# Patient Record
Sex: Female | Born: 1990 | Hispanic: No | Marital: Married | Smoking: Never smoker
Health system: Southern US, Community
[De-identification: ages and names within clinical notes are randomized; demographics above are authoritative.]

## PROBLEM LIST (undated history)

## (undated) ENCOUNTER — Emergency Department (HOSPITAL_BASED_OUTPATIENT_CLINIC_OR_DEPARTMENT_OTHER)

## (undated) DIAGNOSIS — K219 Gastro-esophageal reflux disease without esophagitis: Secondary | ICD-10-CM

## (undated) DIAGNOSIS — O99019 Anemia complicating pregnancy, unspecified trimester: Secondary | ICD-10-CM

## (undated) DIAGNOSIS — O4290 Premature rupture of membranes, unspecified as to length of time between rupture and onset of labor, unspecified weeks of gestation: Secondary | ICD-10-CM

## (undated) DIAGNOSIS — Z3490 Encounter for supervision of normal pregnancy, unspecified, unspecified trimester: Secondary | ICD-10-CM

## (undated) HISTORY — DX: Gastro-esophageal reflux disease without esophagitis: K21.9

---
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
## 2014-08-18 ENCOUNTER — Telehealth: Payer: Self-pay | Admitting: Nurse Practitioner

## 2014-08-26 NOTE — Telephone Encounter (Signed)
Several attempts have been made to contact patient and no answer. This encounter will be closed  

## 2014-11-04 ENCOUNTER — Telehealth: Payer: Self-pay | Admitting: Family Medicine

## 2014-11-04 NOTE — Telephone Encounter (Signed)
Appointment given for tomorrow at 10:25 with Jannifer Rodney, FNP.

## 2014-11-05 ENCOUNTER — Encounter: Payer: Self-pay | Admitting: Physician Assistant

## 2014-11-05 ENCOUNTER — Ambulatory Visit (INDEPENDENT_AMBULATORY_CARE_PROVIDER_SITE_OTHER): Payer: Self-pay | Admitting: Physician Assistant

## 2014-11-05 VITALS — BP 112/70 | HR 84 | Temp 97.0°F | Ht <= 58 in | Wt 111.0 lb

## 2014-11-05 DIAGNOSIS — R1012 Left upper quadrant pain: Secondary | ICD-10-CM

## 2014-11-05 DIAGNOSIS — K5900 Constipation, unspecified: Secondary | ICD-10-CM

## 2014-11-05 DIAGNOSIS — M546 Pain in thoracic spine: Secondary | ICD-10-CM

## 2014-11-05 LAB — POCT CBC
Granulocyte percent: 48.7 %G (ref 37–80)
HCT, POC: 39.8 % (ref 37.7–47.9)
HEMOGLOBIN: 13.1 g/dL (ref 12.2–16.2)
Lymph, poc: 2.3 (ref 0.6–3.4)
MCH: 28.3 pg (ref 27–31.2)
MCHC: 32.9 g/dL (ref 31.8–35.4)
MCV: 85.9 fL (ref 80–97)
MPV: 8.3 fL (ref 0–99.8)
POC Granulocyte: 2.6 (ref 2–6.9)
POC LYMPH PERCENT: 42.1 %L (ref 10–50)
Platelet Count, POC: 354 10*3/uL (ref 142–424)
RBC: 4.63 M/uL (ref 4.04–5.48)
RDW, POC: 12.3 %
WBC: 5.4 10*3/uL (ref 4.6–10.2)

## 2014-11-05 MED ORDER — MELOXICAM 7.5 MG PO TABS: 7.5000 mg | ORAL_TABLET | Freq: Every day | ORAL | Status: DC

## 2014-11-05 MED ORDER — POLYETHYLENE GLYCOL 3350 17 GM/SCOOP PO POWD: 17.0000 g | Freq: Every day | ORAL | Status: DC

## 2014-11-05 NOTE — Progress Notes (Signed)
   Subjective:    Patient ID: Catherine Haney, female    DOB: 11/07/1980, 24 y.o.   MRN: [id number]  HPI 24 y/o female presents with nausea and vomiting, abdominal pain and back pain x 3 days. She presents with her 2 young sons today    Review of Systems  Constitutional: Positive for appetite change (decreased appetite).  HENT: Negative.   Respiratory: Negative.   Cardiovascular: Negative.   Gastrointestinal: Positive for nausea, vomiting, abdominal pain (epigastric) and constipation. Negative for blood in stool.  Genitourinary: Negative for dysuria, frequency and hematuria.  Neurological: Positive for dizziness.       Objective:   Physical Exam  Constitutional: She is oriented to person, place, and time. She appears well-developed and well-nourished. No distress.  petite frame Patient was supposed to have translator. Not available. Difficulty with communication  Cardiovascular: Normal rate, regular rhythm and normal heart sounds.  Exam reveals no gallop and no friction rub.   No murmur heard. Pulmonary/Chest: Effort normal and breath sounds normal. No respiratory distress. She has no wheezes. She has no rales. She exhibits no tenderness.  Musculoskeletal: Normal range of motion. She exhibits tenderness (ttp of bilateral scapluar region and surrounding muscles). She exhibits no edema.  Neurological: She is alert and oriented to person, place, and time.  Skin: She is not diaphoretic.  Psychiatric: She has a normal mood and affect. Her behavior is normal. Judgment and thought content normal.  Nursing note and vitals reviewed.         Assessment & Plan:  1. LUQ abdominal pain - With patients description of symptoms, I will order CBC and Amylase to r/o Pancreatitis or infection.  - POCT CBC - Amylase  2. Bilateral thoracic back pain - Most likely muscle sprain from lifting and picking up children. Several times during visit she picked up the boys which would affect muscles of  the back that she is complaining of pain - POCT CBC - Amylase - meloxicam (MOBIC) 7.5 MG tablet; Take 1 tablet (7.5 mg total) by mouth daily.  Dispense: 30 tablet; Refill: 0  3. Constipation, unspecified constipation type  - polyethylene glycol powder (GLYCOLAX/MIRALAX) powder; Take 17 g by mouth daily.  Dispense: 3350 g; Refill: 1  4. Nausea - most likely due to viral exanthem - Advised patient to follow up if symptoms do not improve      Henrick Mcgue A. Chauncey Reading PA-C

## 2014-11-06 LAB — AMYLASE: AMYLASE: 43 U/L (ref 31–124)

## 2014-11-11 ENCOUNTER — Telehealth: Payer: Self-pay | Admitting: Physician Assistant

## 2014-11-11 NOTE — Telephone Encounter (Signed)
Pt notified lab results were nml Verbalizes understanding appt scheduled

## 2014-12-08 ENCOUNTER — Ambulatory Visit (INDEPENDENT_AMBULATORY_CARE_PROVIDER_SITE_OTHER): Payer: Self-pay | Admitting: Nurse Practitioner

## 2014-12-08 ENCOUNTER — Encounter: Payer: Self-pay | Admitting: Nurse Practitioner

## 2014-12-08 VITALS — BP 121/74 | HR 88 | Temp 97.5°F | Ht <= 58 in | Wt 111.0 lb

## 2014-12-08 DIAGNOSIS — M5431 Sciatica, right side: Secondary | ICD-10-CM

## 2014-12-08 MED ORDER — CYCLOBENZAPRINE HCL 5 MG PO TABS: 5.0000 mg | ORAL_TABLET | Freq: Three times a day (TID) | ORAL | Status: DC | PRN

## 2014-12-08 MED ORDER — KETOROLAC TROMETHAMINE 60 MG/2ML IM SOLN
60.0000 mg | Freq: Once | INTRAMUSCULAR | Status: AC
  Administered 2014-12-08: 60 mg via INTRAMUSCULAR

## 2014-12-08 MED ORDER — MELOXICAM 15 MG PO TABS: 15.0000 mg | ORAL_TABLET | Freq: Every day | ORAL | Status: DC

## 2014-12-08 NOTE — Patient Instructions (Signed)
Sciatica Sciatica is pain, weakness, numbness, or tingling along the path of the sciatic nerve. The nerve starts in the lower back and runs down the back of each leg. The nerve controls the muscles in the lower leg and in the back of the knee, while also providing sensation to the back of the thigh, lower leg, and the sole of your foot. Sciatica is a symptom of another medical condition. For instance, nerve damage or certain conditions, such as a herniated disk or bone spur on the spine, pinch or put pressure on the sciatic nerve. This causes the pain, weakness, or other sensations normally associated with sciatica. Generally, sciatica only affects one side of the body. CAUSES   Herniated or slipped disc.  Degenerative disk disease.  A pain disorder involving the narrow muscle in the buttocks (piriformis syndrome).  Pelvic injury or fracture.  Pregnancy.  Tumor (rare). SYMPTOMS  Symptoms can vary from mild to very severe. The symptoms usually travel from the low back to the buttocks and down the back of the leg. Symptoms can include:  Mild tingling or dull aches in the lower back, leg, or hip.  Numbness in the back of the calf or sole of the foot.  Burning sensations in the lower back, leg, or hip.  Sharp pains in the lower back, leg, or hip.  Leg weakness.  Severe back pain inhibiting movement. These symptoms may get worse with coughing, sneezing, laughing, or prolonged sitting or standing. Also, being overweight may worsen symptoms. DIAGNOSIS  Your caregiver will perform a physical exam to look for common symptoms of sciatica. He or she may ask you to do certain movements or activities that would trigger sciatic nerve pain. Other tests may be performed to find the cause of the sciatica. These may include:  Blood tests.  X-rays.  Imaging tests, such as an MRI or CT scan. TREATMENT  Treatment is directed at the cause of the sciatic pain. Sometimes, treatment is not necessary  and the pain and discomfort goes away on its own. If treatment is needed, your caregiver may suggest:  Over-the-counter medicines to relieve pain.  Prescription medicines, such as anti-inflammatory medicine, muscle relaxants, or narcotics.  Applying heat or ice to the painful area.  Steroid injections to lessen pain, irritation, and inflammation around the nerve.  Reducing activity during periods of pain.  Exercising and stretching to strengthen your abdomen and improve flexibility of your spine. Your caregiver may suggest losing weight if the extra weight makes the back pain worse.  Physical therapy.  Surgery to eliminate what is pressing or pinching the nerve, such as a bone spur or part of a herniated disk. HOME CARE INSTRUCTIONS   Only take over-the-counter or prescription medicines for pain or discomfort as directed by your caregiver.  Apply ice to the affected area for 20 minutes, 3-4 times a day for the first 48-72 hours. Then try heat in the same way.  Exercise, stretch, or perform your usual activities if these do not aggravate your pain.  Attend physical therapy sessions as directed by your caregiver.  Keep all follow-up appointments as directed by your caregiver.  Do not wear high heels or shoes that do not provide proper support.  Check your mattress to see if it is too soft. A firm mattress may lessen your pain and discomfort. SEEK IMMEDIATE MEDICAL CARE IF:   You lose control of your bowel or bladder (incontinence).  You have increasing weakness in the lower back, pelvis, buttocks,   or legs.  You have redness or swelling of your back.  You have a burning sensation when you urinate.  You have pain that gets worse when you lie down or awakens you at night.  Your pain is worse than you have experienced in the past.  Your pain is lasting longer than 4 weeks.  You are suddenly losing weight without reason. MAKE SURE YOU:  Understand these  instructions.  Will watch your condition.  Will get help right away if you are not doing well or get worse. Document Released: 05/03/2001 Document Revised: 11/08/2011 Document Reviewed: 09/18/2011 Exit[employer] Patient Information 2015 [employer]. This information is not intended to replace advice given to you by your health care provider. Make sure you discuss any questions you have with your health care provider.  

## 2014-12-08 NOTE — Progress Notes (Signed)
   Subjective:    Patient ID: Catherine Haney, female    DOB: [birth year], 24 y.o.   MRN: [id number]  HPI Patient in today to discuss back pain- SHe does not speak english so interpreter in to translate- She says that she has a mirena. SHe is having back pain and supra pubic pain and she wonders if the mi[facility] has [facility] month which is unusual for her. Describe pain as low back pain that radiates down right leg. Standing increases pain. Sitting decreases pain. Rates 4/10 currently.    Review of Systems  Constitutional: Negative.   HENT: Negative.   Respiratory: Negative.   Cardiovascular: Negative.   Genitourinary: Negative.   Neurological: Negative.   Psychiatric/Behavioral: Negative.   All other systems reviewed and are negative.      Objective:   Physical Exam  Constitutional: She is oriented to person, place, and time. She appears well-developed and well-nourished.  Genitourinary:  mirena string visible  Musculoskeletal:  FROM of lumbar spine with pain on flexion (+) SLR on right at 90 degrees Motor strength and sensation distally intact  Neurological: She is alert and oriented to person, place, and time.  Skin: Skin is warm and dry.  Psychiatric: She has a normal mood and affect. Her behavior is normal. Judgment and thought content normal.    BP 121/74 mmHg  Pulse 88  Temp(Src) 97.5 F (36.4 C) (Oral)  Ht 4\' 5"  (1.346 m)  Wt 111 lb (50.349 kg)  BMI 27.79 kg/m2       Assessment & Plan:  1. Sciatica, right Moist heat  Rest  RTO prn - ketorolac (TORADOL) injection 60 mg; Inject 2 mLs (60 mg total) into the muscle once. - cyclobenzaprine (FLEXERIL) 5 MG tablet; Take 1 tablet (5 mg total) by mouth 3 (three) times daily as needed for muscle spasms.  Dispense: 30 tablet; Refill: 1 - meloxicam (MOBIC) 15 MG tablet; Take 1 tablet (15 mg total) by mouth daily.  Dispense: 30 tablet; Refill: 3  Mary-Margaret Daphine DeutscherMartin, FNP

## 2015-03-18 ENCOUNTER — Ambulatory Visit (INDEPENDENT_AMBULATORY_CARE_PROVIDER_SITE_OTHER): Payer: Self-pay | Admitting: Family Medicine

## 2015-03-18 ENCOUNTER — Encounter: Payer: Self-pay | Admitting: Family Medicine

## 2015-03-18 VITALS — BP 119/75 | HR 85 | Temp 97.6°F | Ht 60.0 in | Wt 119.6 lb

## 2015-03-18 DIAGNOSIS — J302 Other seasonal allergic rhinitis: Secondary | ICD-10-CM | POA: Insufficient documentation

## 2015-03-18 DIAGNOSIS — G8929 Other chronic pain: Secondary | ICD-10-CM

## 2015-03-18 DIAGNOSIS — M2550 Pain in unspecified joint: Secondary | ICD-10-CM

## 2015-03-18 MED ORDER — FLUTICASONE PROPIONATE 50 MCG/ACT NA SUSP: 2.0000 | Freq: Every day | NASAL | Status: DC

## 2015-03-18 MED ORDER — LORATADINE 10 MG PO TABS: 10.0000 mg | ORAL_TABLET | Freq: Every day | ORAL | Status: DC

## 2015-03-18 MED ORDER — MELOXICAM 15 MG PO TABS: 15.0000 mg | ORAL_TABLET | Freq: Every day | ORAL | Status: DC

## 2015-03-18 MED ORDER — CYCLOBENZAPRINE HCL 5 MG PO TABS: 5.0000 mg | ORAL_TABLET | Freq: Three times a day (TID) | ORAL | Status: DC | PRN

## 2015-03-18 NOTE — Assessment & Plan Note (Signed)
Patient has diffuse joint pain in shoulders and neck and elbows and wrists and knees

## 2015-03-18 NOTE — Assessment & Plan Note (Signed)
Refill mobic and flexeril, want to test for Rheumatoid arthritis but does not have insurance so will test later.

## 2015-03-18 NOTE — Progress Notes (Signed)
BP 119/75 mmHg  Pulse 85  Temp(Src) 97.6 F (36.4 C) (Oral)  Ht 5' (1.524 m)  Wt 119 lb 9.6 oz (54.25 kg)  BMI 23.36 kg/m2   Subjective:    Patient ID: Catherine Haney, female    DOB: 09-09-1990, 24 y.o.   MRN: [id number]  HPI: Catherine Haney is a 24 y.o. female presenting on 03/18/2015 for Joint Pain; Sinusitis; and Cough   HPI Sinus pressure and sore throat Patient presents today with sinus pressure and sore throat. She has been having postnasal drainage. And wakes up in the morning with congestion and sore throat. This time it has been going on for 2 days but she seems to get this every couple weeks and it is especially worse this time of year. She had been diagnosed with allergies prior to coming to [place] and seems to be having this every year for the past 3 years here in [place]. She has not tried any over-the-counter medications for this. She denies any fevers or chills or ear pain or pressure.  Joint pain Patient has been having joint pain in her knees, elbows, wrists and neck for the past couple months. She was given Mobic and Flexeril 2 months ago and they greatly helped her pain medicine as she ran out of them a came back. She cannot recall any rheumatological disorders in her family. The joint pain is dull and achy in nature and is a 3 out of 10. She denies any redness or warmth of the joints.  Relevant past medical, surgical, family and social history reviewed and updated as indicated. Interim medical history since our last visit reviewed. Allergies and medications reviewed and updated.  Review of Systems  Constitutional: Negative for fever and chills.  HENT: Positive for postnasal drip, rhinorrhea, sinus pressure and sore throat. Negative for congestion, ear discharge, ear pain and sneezing.   Eyes: Negative for pain, redness and visual disturbance.  Respiratory: Positive for cough. Negative for chest tightness and shortness of breath.   Cardiovascular:  Negative for chest pain and leg swelling.  Genitourinary: Negative for dysuria and difficulty urinating.  Musculoskeletal: Positive for arthralgias and neck pain. Negative for back pain and gait problem.  Skin: Negative for rash.  Neurological: Negative for light-headedness and headaches.  Psychiatric/Behavioral: Negative for behavioral problems and agitation.  All other systems reviewed and are negative.   Per HPI unless specifically indicated above     Medication List       This list is accurate as of: 03/18/15  2:20 PM.  Always use your most recent med list.               COLLAGEN-VITAMIN C PO  Take by mouth. Take two per day     cyclobenzaprine 5 MG tablet  Commonly known as:  FLEXERIL  Take 1 tablet (5 mg total) by mouth 3 (three) times daily as needed for muscle spasms.     fluticasone 50 MCG/ACT nasal spray  Commonly known as:  FLONASE  Place 2 sprays into both nostrils daily.     loratadine 10 MG tablet  Commonly known as:  CLARITIN  Take 1 tablet (10 mg total) by mouth daily.     meloxicam 15 MG tablet  Commonly known as:  MOBIC  Take 1 tablet (15 mg total) by mouth daily.           Objective:    BP 119/75 mmHg  Pulse 85  Temp(Src) 97.6 F (36.4 C) (Oral)  Ht 5' (1.524 m)  Wt 119 lb 9.6 oz (54.25 kg)  BMI 23.36 kg/m2  Wt Readings from Last 3 Encounters:  03/18/15 119 lb 9.6 oz (54.25 kg)  12/08/14 111 lb (50.349 kg)  11/05/14 111 lb (50.349 kg)    Physical Exam  Constitutional: She is oriented to person, place, and time. She appears well-developed and well-nourished. No distress.  HENT:  Right Ear: Tympanic membrane, external ear and ear canal normal.  Left Ear: Tympanic membrane, external ear and ear canal normal.  Nose: Mucosal edema and rhinorrhea present. No epistaxis. Right sinus exhibits no maxillary sinus tenderness and no frontal sinus tenderness. Left sinus exhibits no maxillary sinus tenderness and no frontal sinus tenderness.    Mouth/Throat: Uvula is midline and mucous membranes are normal. Posterior oropharyngeal edema and posterior oropharyngeal erythema present. No oropharyngeal exudate or tonsillar abscesses.  Eyes: Conjunctivae and EOM are normal.  Neck: Neck supple. No thyromegaly present.  Cardiovascular: Normal rate, regular rhythm, normal heart sounds and intact distal pulses.   No murmur heard. Pulmonary/Chest: Effort normal and breath sounds normal. No respiratory distress. She has no wheezes.  Musculoskeletal: Normal range of motion. She exhibits no edema or tenderness.  No tenderness on exam any of the joints. She says the pain is deeper and inside. No swelling or erythema or warmth.  Lymphadenopathy:    She has no cervical adenopathy.  Neurological: She is alert and oriented to person, place, and time. Coordination normal.  Skin: Skin is warm and dry. No rash noted. She is not diaphoretic.  Psychiatric: She has a normal mood and affect. Her behavior is normal.  Vitals reviewed.   Results for orders placed or performed in visit on 11/05/14  Amylase  Result Value Ref Range   Amylase 43 31 - 124 U/L  POCT CBC  Result Value Ref Range   WBC 5.4 4.6 - 10.2 K/uL   Lymph, poc 2.3 0.6 - 3.4   POC LYMPH PERCENT 42.1 10 - 50 %L   POC Granulocyte 2.6 2 - 6.9   Granulocyte percent 48.7 37 - 80 %G   RBC 4.63 4.04 - 5.48 M/uL   Hemoglobin 13.1 12.2 - 16.2 g/dL   HCT, POC 57.339.8 22.037.7 - 47.9 %   MCV 85.9 80 - 97 fL   MCH, POC 28.3 27 - 31.2 pg   MCHC 32.9 31.8 - 35.4 g/dL   RDW, POC 25.412.3 %   Platelet Count, POC 354 142 - 424 K/uL   MPV 8.3 0 - 99.8 fL      Assessment & Plan:   Problem List Items Addressed This Visit      Respiratory   Other seasonal allergic rhinitis - Primary    Patient has diffuse joint pain in shoulders and neck and elbows and wrists and knees      Relevant Medications   fluticasone (FLONASE) 50 MCG/ACT nasal spray   loratadine (CLARITIN) 10 MG tablet     Other   Chronic  pain of multiple joints    Refill mobic and flexeril, want to test for Rheumatoid arthritis but does not have insurance so will test later.      Relevant Medications   cyclobenzaprine (FLEXERIL) 5 MG tablet   meloxicam (MOBIC) 15 MG tablet       Follow up plan: Return in about 4 weeks (around 04/15/2015), or if symptoms worsen or fail to improve, for WWE pap.  Arville CareJoshua Candies Palm, MD [facility] 03/18/2015, 2:20 PM

## 2015-04-13 ENCOUNTER — Other Ambulatory Visit: Payer: Self-pay | Admitting: Family Medicine

## 2015-06-11 ENCOUNTER — Encounter: Payer: Self-pay | Admitting: Family Medicine

## 2015-06-11 ENCOUNTER — Ambulatory Visit (INDEPENDENT_AMBULATORY_CARE_PROVIDER_SITE_OTHER): Payer: BLUE CROSS/BLUE SHIELD | Admitting: Family Medicine

## 2015-06-11 ENCOUNTER — Ambulatory Visit (INDEPENDENT_AMBULATORY_CARE_PROVIDER_SITE_OTHER): Payer: BLUE CROSS/BLUE SHIELD

## 2015-06-11 VITALS — BP 113/80 | HR 81 | Temp 98.5°F | Ht 60.0 in | Wt 118.6 lb

## 2015-06-11 DIAGNOSIS — M546 Pain in thoracic spine: Secondary | ICD-10-CM

## 2015-06-11 DIAGNOSIS — K2100 Gastro-esophageal reflux disease with esophagitis, without bleeding: Secondary | ICD-10-CM

## 2015-06-11 MED ORDER — CYCLOBENZAPRINE HCL 5 MG PO TABS: 5.0000 mg | ORAL_TABLET | Freq: Three times a day (TID) | ORAL | Status: DC | PRN

## 2015-06-11 MED ORDER — RANITIDINE HCL 150 MG PO TABS: 150.0000 mg | ORAL_TABLET | Freq: Two times a day (BID) | ORAL | Status: DC | PRN

## 2015-06-11 NOTE — Progress Notes (Signed)
BP 113/80 mmHg  Pulse 81  Temp(Src) 98.5 F (36.9 C) (Oral)  Ht 5' (1.524 m)  Wt 118 lb 9.6 oz (53.797 kg)  BMI 23.16 kg/m2   Subjective:    Patient ID: Catherine Haney, female    DOB: 1990/08/10, 25 y.o.   MRN: [id number]  HPI: Catherine Haney is a 25 y.o. female presenting on 06/11/2015 for Back Pain and Abdominal Pain   HPI Abdominal pain Patient has been having intermittent abdominal pain for the past few weeks. She had 2 days of diarrhea but that hasn't been consistent throughout. She denies any fevers or chills. She denies any vomiting but does have belching. She denies any blood in her stool. She denies any urinary issues The pain is up high in her stomach and just above her umbilicus as well. she's had this intermittently sporadically before but never lasted as long as this. The pain does not radiate anywhere.  Neck and back pain Patient has been having bilateral neck and mid back pain that is worse when she is doing a lot of house work and working out around [employer]. She denies any fevers or chills or overlying skin changes. She does complain of sometimes the pain will go down her left posterior thigh. She has these aches and pains sporadically and before she was given Flexeril and Mobic and they helped. She has since run out of Flexeril and feels like it is not helping as much without the Flexeril.  Relevant past medical, surgical, family and social history reviewed and updated as indicated. Interim medical history since our last visit reviewed. Allergies and medications reviewed and updated.  Review of Systems  Constitutional: Negative for fever and chills.  HENT: Negative for congestion, ear discharge and ear pain.   Eyes: Negative for redness and visual disturbance.  Respiratory: Negative for chest tightness and shortness of breath.   Cardiovascular: Negative for chest pain and leg swelling.  Gastrointestinal: Positive for nausea and abdominal pain. Negative for vomiting,  diarrhea, constipation, blood in stool, abdominal distention and anal bleeding.  Genitourinary: Negative for dysuria and difficulty urinating.  Musculoskeletal: Positive for myalgias, back pain and neck pain. Negative for gait problem.  Skin: Negative for rash.  Neurological: Negative for light-headedness and headaches.  Psychiatric/Behavioral: Negative for behavioral problems and agitation.  All other systems reviewed and are negative.   Per HPI unless specifically indicated above     Medication List       This list is accurate as of: 06/11/15 10:53 AM.  Always use your most recent med list.               COLLAGEN-VITAMIN C PO  Take by mouth. Take two per day     cyclobenzaprine 5 MG tablet  Commonly known as:  FLEXERIL  Take 1 tablet (5 mg total) by mouth 3 (three) times daily as needed for muscle spasms.     fluticasone 50 MCG/ACT nasal spray  Commonly known as:  FLONASE  Place 2 sprays into both nostrils daily.     loratadine 10 MG tablet  Commonly known as:  CLARITIN  Take 1 tablet (10 mg total) by mouth daily.     meloxicam 15 MG tablet  Commonly known as:  MOBIC  Take 1 tablet (15 mg total) by mouth daily.     ranitidine 150 MG tablet  Commonly known as:  ZANTAC  Take 1 tablet (150 mg total) by mouth 2 (two) times daily as needed for heartburn.  Objective:    BP 113/80 mmHg  Pulse 81  Temp(Src) 98.5 F (36.9 C) (Oral)  Ht 5' (1.524 m)  Wt 118 lb 9.6 oz (53.797 kg)  BMI 23.16 kg/m2  Wt Readings from Last 3 Encounters:  06/11/15 118 lb 9.6 oz (53.797 kg)  03/18/15 119 lb 9.6 oz (54.25 kg)  12/08/14 111 lb (50.349 kg)    Physical Exam  Constitutional: She is oriented to person, place, and time. She appears well-developed and well-nourished. No distress.  Eyes: Conjunctivae and EOM are normal. Pupils are equal, round, and reactive to light.  Cardiovascular: Normal rate, regular rhythm, normal heart sounds and intact distal pulses.   No  murmur heard. Pulmonary/Chest: Effort normal and breath sounds normal. No respiratory distress. She has no wheezes.  Abdominal: Soft. Bowel sounds are normal. She exhibits no distension and no mass. There is tenderness. There is no rebound and no guarding.  Musculoskeletal: Normal range of motion. She exhibits no edema.       Cervical back: She exhibits tenderness (Bilateral muscle tenderness, no spinal tenderness). She exhibits normal range of motion, no bony tenderness, no swelling, no deformity and no laceration.       Thoracic back: She exhibits tenderness (Bilateral paraspinal tenderness, no midline tenderness, negative straight leg raise.). She exhibits normal range of motion, no bony tenderness, no swelling and no edema.  Neurological: She is alert and oriented to person, place, and time. Coordination normal.  Skin: Skin is warm and dry. No rash noted. She is not diaphoretic.  Psychiatric: She has a normal mood and affect. Her behavior is normal.  Nursing note and vitals reviewed.     Assessment & Plan:   Problem List Items Addressed This Visit    None    Visit Diagnoses    Bilateral thoracic back pain    -  Primary    Patient has lumbar and thoracic back pain hat radiate into her leg with sciatiic especially on the left    Relevant Medications    cyclobenzaprine (FLEXERIL) 5 MG tablet    Other Relevant Orders    DG Lumbar Spine 2-3 Views    DG Thoracic Spine 2 View    Gastroesophageal reflux disease with esophagitis        Relevant Medications    ranitidine (ZANTAC) 150 MG tablet        Follow up plan: Return if symptoms worsen or fail to improve.  Counseling provided for all of the vaccine components Orders Placed This Encounter  Procedures  . DG Lumbar Spine 2-3 Views  . DG Thoracic Spine 2 View    Arville Care, MD [facility] 06/11/2015, 10:53 AM

## 2015-07-09 ENCOUNTER — Ambulatory Visit (INDEPENDENT_AMBULATORY_CARE_PROVIDER_SITE_OTHER): Payer: BLUE CROSS/BLUE SHIELD | Admitting: Family Medicine

## 2015-07-09 ENCOUNTER — Encounter: Payer: Self-pay | Admitting: Family Medicine

## 2015-07-09 VITALS — BP 114/73 | HR 93 | Temp 97.7°F | Ht 60.0 in | Wt 114.0 lb

## 2015-07-09 DIAGNOSIS — N289 Disorder of kidney and ureter, unspecified: Secondary | ICD-10-CM | POA: Diagnosis not present

## 2015-07-09 DIAGNOSIS — R1030 Lower abdominal pain, unspecified: Secondary | ICD-10-CM | POA: Diagnosis not present

## 2015-07-09 LAB — POCT UA - MICROSCOPIC ONLY
CASTS, UR, LPF, POC: NEGATIVE
CRYSTALS, UR, HPF, POC: NEGATIVE
YEAST UA: NEGATIVE

## 2015-07-09 LAB — POCT URINALYSIS DIPSTICK
BILIRUBIN UA: NEGATIVE
GLUCOSE UA: NEGATIVE
KETONES UA: NEGATIVE
Leukocytes, UA: NEGATIVE
Nitrite, UA: NEGATIVE
UROBILINOGEN UA: NEGATIVE
pH, UA: 6

## 2015-07-09 LAB — POCT URINE PREGNANCY: Preg Test, Ur: NEGATIVE

## 2015-07-09 NOTE — Progress Notes (Signed)
BP 114/73 mmHg  Pulse 93  Temp(Src) 97.7 F (36.5 C) (Oral)  Ht 5' (1.524 m)  Wt 114 lb (51.71 kg)  BMI 22.26 kg/m2   Subjective:    Patient ID: Catherine Haney, female    DOB: 03/30/1991, 25 y.o.   MRN: [id number]  HPI: Catherine Haney is a 25 y.o. female presenting on 07/09/2015 for Slight vaginal spotting of blood on Sunday and Heartburn   HPI Abdominal pain Patient has had dysuria and an episode of lower abdominal pain and vaginal bleeding. The dysuria has been going on for about a week off and on. She 4 days ago lifted something heavy and had lower abdominal pain on the left lower quadrant suprapubic region that radiated to her left flank and down her left leg. She denies any fevers or chills. She denies any changes in color of urine. She is concerned with the vaginal bleeding episode that she had which lasted 2 hours on that day about her Mirena and whether or not her brain is working. She has never had bleeding quite as much is that 2 hour time period when she does have breakthrough spotting  Relevant past medical, surgical, family and social history reviewed and updated as indicated. Interim medical history since our last visit reviewed. Allergies and medications reviewed and updated.  Review of Systems  Constitutional: Negative for fever and chills.  HENT: Negative for congestion, ear discharge and ear pain.   Eyes: Negative for redness and visual disturbance.  Respiratory: Negative for chest tightness and shortness of breath.   Cardiovascular: Negative for chest pain and leg swelling.  Gastrointestinal: Positive for abdominal pain. Negative for nausea, vomiting, diarrhea and constipation.  Genitourinary: Positive for dysuria, urgency, frequency, flank pain and menstrual problem (2 hours of bleeding as breakthrough while on Mirena). Negative for hematuria, decreased urine volume and difficulty urinating.  Musculoskeletal: Negative for back pain and gait problem.  Skin: Negative  for rash.  Neurological: Negative for dizziness, light-headedness and headaches.  Psychiatric/Behavioral: Negative for behavioral problems and agitation.  All other systems reviewed and are negative.   Per HPI unless specifically indicated above     Medication List       This list is accurate as of: 07/09/15 10:45 AM.  Always use your most recent med list.               COLLAGEN-VITAMIN C PO  Take by mouth. Take two per day     cyclobenzaprine 5 MG tablet  Commonly known as:  FLEXERIL  Take 1 tablet (5 mg total) by mouth 3 (three) times daily as needed for muscle spasms.     fluticasone 50 MCG/ACT nasal spray  Commonly known as:  FLONASE  Place 2 sprays into both nostrils daily.     loratadine 10 MG tablet  Commonly known as:  CLARITIN  Take 1 tablet (10 mg total) by mouth daily.     meloxicam 15 MG tablet  Commonly known as:  MOBIC  Take 1 tablet (15 mg total) by mouth daily.     ranitidine 150 MG tablet  Commonly known as:  ZANTAC  Take 1 tablet (150 mg total) by mouth 2 (two) times daily as needed for heartburn.           Objective:    BP 114/73 mmHg  Pulse 93  Temp(Src) 97.7 F (36.5 C) (Oral)  Ht 5' (1.524 m)  Wt 114 lb (51.71 kg)  BMI 22.26 kg/m2  Wt Readings from  Last 3 Encounters:  07/09/15 114 lb (51.71 kg)  06/11/15 118 lb 9.6 oz (53.797 kg)  03/18/15 119 lb 9.6 oz (54.25 kg)    Physical Exam  Constitutional: She is oriented to person, place, and time. She appears well-developed and well-nourished. No distress.  Eyes: Conjunctivae and EOM are normal. Pupils are equal, round, and reactive to light.  Neck: Neck supple. No thyromegaly present.  Cardiovascular: Normal rate, regular rhythm, normal heart sounds and intact distal pulses.   No murmur heard. Pulmonary/Chest: Effort normal and breath sounds normal. No respiratory distress. She has no wheezes.  Abdominal: Soft. Bowel sounds are normal. She exhibits no distension and no mass. There is  tenderness. There is no rebound and no guarding.  Musculoskeletal: Normal range of motion. She exhibits no edema.       Lumbar back: She exhibits tenderness (bilateral paraspinal lower tenderness. Difficult to discern a CVA tenderness or not.). She exhibits normal range of motion.  Lymphadenopathy:    She has no cervical adenopathy.  Neurological: She is alert and oriented to person, place, and time. Coordination normal.  Skin: Skin is warm and dry. No rash noted. She is not diaphoretic.  Psychiatric: She has a normal mood and affect. Her behavior is normal.  Nursing note and vitals reviewed.   Results for orders placed or performed in visit on 07/09/15  POCT urine pregnancy  Result Value Ref Range   Preg Test, Ur Negative Negative  POCT urinalysis dipstick  Result Value Ref Range   Color, UA gold    Clarity, UA clear    Glucose, UA neg    Bilirubin, UA neg    Ketones, UA neg    Spec Grav, UA >=1.030    Blood, UA large    pH, UA 6.0    Protein, UA 4++++    Urobilinogen, UA negative    Nitrite, UA neg    Leukocytes, UA Negative Negative  POCT UA - Microscopic Only  Result Value Ref Range   WBC, Ur, HPF, POC occ    RBC, urine, microscopic 5-8    Bacteria, U Microscopic occ    Mucus, UA occ    Epithelial cells, urine per micros occ    Crystals, Ur, HPF, POC neg    Casts, Ur, LPF, POC neg    Yeast, UA neg       Assessment & Plan:   Problem List Items Addressed This Visit    None    Visit Diagnoses    Lower abdominal pain    -  Primary    Relevant Orders    POCT urine pregnancy (Completed)    POCT urinalysis dipstick (Completed)    POCT UA - Microscopic Only (Completed)    Nephropathy        Relevant Orders    CMP14+EGFR    CBC with Differential/Platelet    Protein / Creatinine Ratio, Urine       Follow up plan: Return in about 1 day (around 07/10/2015), or if symptoms worsen or fail to improve.  Counseling provided for all of the vaccine components Orders  Placed This Encounter  Procedures  . POCT urine pregnancy  . POCT urinalysis dipstick  . POCT UA - Microscopic Only    Caryl Pina, MD [facility] 07/09/2015, 10:45 AM

## 2015-07-10 ENCOUNTER — Ambulatory Visit: Payer: BLUE CROSS/BLUE SHIELD | Admitting: Family Medicine

## 2015-07-10 ENCOUNTER — Ambulatory Visit (INDEPENDENT_AMBULATORY_CARE_PROVIDER_SITE_OTHER): Payer: BLUE CROSS/BLUE SHIELD | Admitting: Family Medicine

## 2015-07-10 ENCOUNTER — Telehealth: Payer: Self-pay | Admitting: Family Medicine

## 2015-07-10 ENCOUNTER — Encounter: Payer: Self-pay | Admitting: Family Medicine

## 2015-07-10 VITALS — BP 130/71 | HR 108 | Temp 98.7°F | Ht 60.0 in | Wt 114.0 lb

## 2015-07-10 DIAGNOSIS — N289 Disorder of kidney and ureter, unspecified: Secondary | ICD-10-CM

## 2015-07-10 DIAGNOSIS — R809 Proteinuria, unspecified: Secondary | ICD-10-CM

## 2015-07-10 LAB — BMP8+EGFR
BUN/Creatinine Ratio: 16 (ref 8–20)
BUN: 9 mg/dL (ref 6–20)
CO2: 21 mmol/L (ref 18–29)
CREATININE: 0.56 mg/dL — AB (ref 0.57–1.00)
Calcium: 9.1 mg/dL (ref 8.7–10.2)
Chloride: 101 mmol/L (ref 96–106)
GFR calc Af Amer: 150 mL/min/{1.73_m2} (ref >59)
GFR calc non Af Amer: 130 mL/min/{1.73_m2} (ref >59)
GLUCOSE: 112 mg/dL — AB (ref 65–99)
Potassium: 4.2 mmol/L (ref 3.5–5.2)
Sodium: 140 mmol/L (ref 134–144)

## 2015-07-10 LAB — CBC WITH DIFFERENTIAL/PLATELET
BASOS ABS: 0 10*3/uL (ref 0.0–0.2)
Basos: 1 %
EOS (ABSOLUTE): 0.1 10*3/uL (ref 0.0–0.4)
EOS: 1 %
Hematocrit: 36.1 % (ref 34.0–46.6)
Hemoglobin: 12.3 g/dL (ref 11.1–15.9)
IMMATURE GRANS (ABS): 0 10*3/uL (ref 0.0–0.1)
Immature Granulocytes: 0 %
Lymphocytes Absolute: 1.6 10*3/uL (ref 0.7–3.1)
Lymphs: 39 %
MCH: 28.9 pg (ref 26.6–33.0)
MCHC: 34.1 g/dL (ref 31.5–35.7)
MCV: 85 fL (ref 79–97)
MONOCYTES: 15 %
Monocytes Absolute: 0.6 10*3/uL (ref 0.1–0.9)
NEUTROS PCT: 44 %
Neutrophils Absolute: 1.8 10*3/uL (ref 1.4–7.0)
PLATELETS: 342 10*3/uL (ref 150–379)
RBC: 4.25 x10E6/uL (ref 3.77–5.28)
RDW: 13 % (ref 12.3–15.4)
WBC: 4.1 10*3/uL (ref 3.4–10.8)

## 2015-07-10 LAB — PROTEIN / CREATININE RATIO, URINE
Creatinine, Urine: 265.1 mg/dL
Protein, Ur: 103.6 mg/dL
Protein/Creat Ratio: 391 mg/g creat — ABNORMAL HIGH (ref 0–200)

## 2015-07-10 NOTE — Progress Notes (Signed)
BP 130/71 mmHg  Pulse 108  Temp(Src) 98.7 F (37.1 C) (Oral)  Ht 5' (1.524 m)  Wt 114 lb (51.71 kg)  BMI 22.26 kg/m2   Subjective:    Patient ID: Catherine Haney, female    DOB: 08/20/1990, 25 y.o.   MRN: [id number]  HPI: Catherine Haney is a 25 y.o. female presenting on 07/10/2015 for Discuss need for labs to check kidneys   HPI Proteinuria Patient is coming back in for a checkup because she had to leave early after the labs yesterday for proteinuria and unfortunately in transit the blood work was misplaced from yesterday so we don't have the results back of her kidney function and we'll redraw that today and she is understandable with this. She has not had any further vaginal bleeding or abdominal pain is still about the same.  Relevant past medical, surgical, family and social history reviewed and updated as indicated. Interim medical history since our last visit reviewed. Allergies and medications reviewed and updated.  Review of Systems  Constitutional: Negative for fever and chills.  HENT: Negative for congestion, ear discharge and ear pain.   Eyes: Negative for redness and visual disturbance.  Respiratory: Negative for chest tightness and shortness of breath.   Cardiovascular: Negative for chest pain and leg swelling.  Gastrointestinal: Positive for abdominal pain (Left lower quadrant). Negative for nausea, diarrhea, constipation and blood in stool.  Genitourinary: Positive for menstrual problem (She had that one episode in 2 hours vaginal bleeding but nothing since.). Negative for dysuria and difficulty urinating.  Musculoskeletal: Negative for back pain and gait problem.  Skin: Negative for rash.  Neurological: Negative for dizziness, light-headedness and headaches.  Psychiatric/Behavioral: Negative for behavioral problems and agitation.  All other systems reviewed and are negative.   Per HPI unless specifically indicated above     Medication List       This list is  accurate as of: 07/10/15 10:50 AM.  Always use your most recent med list.               COLLAGEN-VITAMIN C PO  Take by mouth. Take two per day     cyclobenzaprine 5 MG tablet  Commonly known as:  FLEXERIL  Take 1 tablet (5 mg total) by mouth 3 (three) times daily as needed for muscle spasms.     fluticasone 50 MCG/ACT nasal spray  Commonly known as:  FLONASE  Place 2 sprays into both nostrils daily.     loratadine 10 MG tablet  Commonly known as:  CLARITIN  Take 1 tablet (10 mg total) by mouth daily.     meloxicam 15 MG tablet  Commonly known as:  MOBIC  Take 1 tablet (15 mg total) by mouth daily.     ranitidine 150 MG tablet  Commonly known as:  ZANTAC  Take 1 tablet (150 mg total) by mouth 2 (two) times daily as needed for heartburn.           Objective:    BP 130/71 mmHg  Pulse 108  Temp(Src) 98.7 F (37.1 C) (Oral)  Ht 5' (1.524 m)  Wt 114 lb (51.71 kg)  BMI 22.26 kg/m2  Wt Readings from Last 3 Encounters:  07/10/15 114 lb (51.71 kg)  07/09/15 114 lb (51.71 kg)  06/11/15 118 lb 9.6 oz (53.797 kg)    Physical Exam  Constitutional: She is oriented to person, place, and time. She appears well-developed and well-nourished. No distress.  Eyes: Conjunctivae and EOM are normal. Pupils are  equal, round, and reactive to light.  Neck: Neck supple. No thyromegaly present.  Cardiovascular: Normal rate, regular rhythm, normal heart sounds and intact distal pulses.   No murmur heard. Pulmonary/Chest: Effort normal and breath sounds normal. No respiratory distress. She has no wheezes.  Abdominal: Soft. Bowel sounds are normal. She exhibits no distension and no mass. There is tenderness. There is no rebound and no guarding.  Genitourinary:  IUD strings are visible and appear to be in place.  Musculoskeletal: Normal range of motion. She exhibits no edema or tenderness.  Lymphadenopathy:    She has no cervical adenopathy.  Neurological: She is alert and oriented to  person, place, and time. Coordination normal.  Skin: Skin is warm and dry. No rash noted. She is not diaphoretic.  Psychiatric: She has a normal mood and affect. Her behavior is normal.  Nursing note and vitals reviewed.   Results for orders placed or performed in visit on 07/09/15  POCT urine pregnancy  Result Value Ref Range   Preg Test, Ur Negative Negative  POCT urinalysis dipstick  Result Value Ref Range   Color, UA gold    Clarity, UA clear    Glucose, UA neg    Bilirubin, UA neg    Ketones, UA neg    Spec Grav, UA >=1.030    Blood, UA large    pH, UA 6.0    Protein, UA 4++++    Urobilinogen, UA negative    Nitrite, UA neg    Leukocytes, UA Negative Negative  POCT UA - Microscopic Only  Result Value Ref Range   WBC, Ur, HPF, POC occ    RBC, urine, microscopic 5-8    Bacteria, U Microscopic occ    Mucus, UA occ    Epithelial cells, urine per micros occ    Crystals, Ur, HPF, POC neg    Casts, Ur, LPF, POC neg    Yeast, UA neg       Assessment & Plan:   Problem List Items Addressed This Visit    None    Visit Diagnoses    Proteinuria    -  Primary    Relevant Orders    BMP8+EGFR    CBC with Differential/Platelet       Follow up plan: Return if symptoms worsen or fail to improve.  Counseling provided for all of the vaccine components Orders Placed This Encounter  Procedures  . BMP8+EGFR  . CBC with Differential/Platelet    Caryl Pina, MD [facility] 07/10/2015, 10:50 AM

## 2015-07-10 NOTE — Telephone Encounter (Signed)
Added on 24-hour urine protein and creatinine levels after discussing case with nephrology. We will then refer her after we get these levels back to nephrology. Discussed with patient over the phone using a translator. Arville Care, MD [facility] 07/10/2015, 5:05 PM

## 2015-07-11 ENCOUNTER — Other Ambulatory Visit: Payer: BLUE CROSS/BLUE SHIELD

## 2015-07-13 ENCOUNTER — Other Ambulatory Visit: Payer: BLUE CROSS/BLUE SHIELD

## 2015-07-13 ENCOUNTER — Ambulatory Visit: Payer: BLUE CROSS/BLUE SHIELD | Admitting: Family Medicine

## 2015-07-13 DIAGNOSIS — N289 Disorder of kidney and ureter, unspecified: Secondary | ICD-10-CM

## 2015-07-13 NOTE — Progress Notes (Signed)
Lab only 

## 2015-07-14 LAB — ANA COMPREHENSIVE PANEL
Anti JO-1: <0.2 AI (ref 0.0–0.9)
Centromere Ab Screen: <0.2 AI (ref 0.0–0.9)
Chromatin Ab SerPl-aCnc: <0.2 AI (ref 0.0–0.9)
ENA RNP AB: 0.2 AI (ref 0.0–0.9)
ENA SSA (RO) AB: 0.2 AI (ref 0.0–0.9)
ENA SSB (LA) Ab: <0.2 AI (ref 0.0–0.9)
SCL 70: 0.2 AI (ref 0.0–0.9)
dsDNA Ab: 1 IU/mL (ref 0–9)

## 2015-07-14 LAB — C3 COMPLEMENT: COMPLEMENT C3, SERUM: 129 mg/dL (ref 82–167)

## 2015-07-14 LAB — PROTEIN, URINE, 24 HOUR
PROTEIN UR: 5 mg/dL
Protein, 24H Urine: 77.5 mg/24 hr (ref 30.0–150.0)

## 2015-07-14 LAB — COMPLEMENT, TOTAL: Compl, Total (CH50): >60 U/mL — ABNORMAL HIGH (ref 42–60)

## 2015-07-14 LAB — C4 COMPLEMENT: Complement C4, Serum: 32 mg/dL (ref 14–44)

## 2015-07-15 NOTE — Addendum Note (Signed)
Addended by: Bernadene Bell on: 07/15/2015 09:18 AM   Modules accepted: Orders

## 2015-07-24 ENCOUNTER — Encounter: Payer: Self-pay | Admitting: Pediatrics

## 2015-07-24 ENCOUNTER — Ambulatory Visit (INDEPENDENT_AMBULATORY_CARE_PROVIDER_SITE_OTHER): Payer: BLUE CROSS/BLUE SHIELD | Admitting: Pediatrics

## 2015-07-24 VITALS — BP 114/78 | HR 95 | Temp 97.5°F | Ht 60.0 in | Wt 112.4 lb

## 2015-07-24 DIAGNOSIS — F411 Generalized anxiety disorder: Secondary | ICD-10-CM | POA: Diagnosis not present

## 2015-07-24 MED ORDER — PAROXETINE HCL 10 MG PO TABS: 10.0000 mg | ORAL_TABLET | Freq: Every day | ORAL | Status: DC

## 2015-07-24 NOTE — Progress Notes (Signed)
    Subjective:    Patient ID: Catherine Haney, female    DOB: 05/12/1991, 25 y.o.   MRN: [id number]  CC: Shortness of Breath; Fatigue; and Tension   HPI: Catherine SparkDunia Haney is a 25 y.o. female presenting for Shortness of Breath; Fatigue; and Tension  Problem with problems sleeping Feeling anxious for no reason Has been going on past week Stress at home Living at home with husband and children, 26yo and 2 yo Doesn't feel safe at home, "when she goes home feels like she is in a prison" Sometimes feels relaxed and comfortable, other times doesn't want to be around husband or kids Denies husband doing anything to hurt her, says he helps to calm her down Only sleeping1 hr at night She does talk with her husband about anxiety  Mood has been down past two days, cant think of any reason for it to be down Denies thoughts of hurting herself Appetite has been poor, eating once a day, no abd pain     Dep[facility] 07/24/2015 07/10/2015 07/09/2015  Decreased Interest 0 0 0  Down, Depressed, Hopeless 0 0 0  PHQ - 2 Score 0 0 0     Relevant past medical, surgical, family and social history reviewed and updated as indicated. Interim medical history since our last visit reviewed. Allergies and medications reviewed and updated.    ROS: Per HPI unless specifically indicated above  History  Smoking status  . Never Smoker   Smokeless tobacco  . Not on file    Past Medical History Patient Active Problem List   Diagnosis Date Noted  . Generalized anxiety disorder 07/24/2015  . Other seasonal allergic rhinitis 03/18/2015  . Chronic pain of multiple joints 03/18/2015       Objective:    BP 114/78 mmHg  Pulse 95  Temp(Src) 97.5 F (36.4 C) (Oral)  Ht 5' (1.524 m)  Wt 112 lb 6.4 oz (50.984 kg)  BMI 21.95 kg/m2  Wt Readings from Last 3 Encounters:  07/24/15 112 lb 6.4 oz (50.984 kg)  07/10/15 114 lb (51.71 kg)  07/09/15 114 lb (51.71 kg)     Gen: NAD, alert, cooperative  with exam, NCAT EYES: EOMI, no scleral injection or icterus ENT: OP without erythema LYMPH: no cervical LAD CV: NRRR, normal S1/S2, no murmur, distal pulses 2+ b/l Resp: CTABL, no wheezes, normal WOB Abd: +BS, soft, NTND. Ext: No edema, warm Neuro: Alert and oriented, strength equal b/l UE and LE, coordination grossly normal MSK: normal muscle bulk Psych: tearful at times, full affect     Assessment & Plan:    Henrine ScrewsDunia was seen today for anxiety, feels safe at home. Husband has been supportive. No one is hurting her. Return precautions given. Start medicine as below. RTC 2 weeks, sooner if needed. Will check TSH, other labs recently normal. Visit completed with video interpreter.  Diagnoses and all orders for this visit:  Generalized anxiety disorder -     PARoxetine (PAXIL) 10 MG tablet; Take 1 tablet (10 mg total) by mouth daily. -     TSH  I spent 25 minutes with the patient with over 50% of the encounter time dedicated to counseling on the above problems.   Follow up plan: Return in about 2 weeks (around 08/07/2015).  Rex Krasarol Destine Zirkle, MD [facility] 07/24/2015, 11:35 AM

## 2015-07-25 LAB — TSH: TSH: 1.72 u[IU]/mL (ref 0.450–4.500)

## 2015-07-30 ENCOUNTER — Encounter: Payer: BLUE CROSS/BLUE SHIELD | Admitting: Family Medicine

## 2015-07-30 ENCOUNTER — Ambulatory Visit: Payer: BLUE CROSS/BLUE SHIELD | Admitting: Family

## 2015-07-31 ENCOUNTER — Encounter: Payer: Self-pay | Admitting: Family Medicine

## 2015-08-03 ENCOUNTER — Ambulatory Visit (INDEPENDENT_AMBULATORY_CARE_PROVIDER_SITE_OTHER): Payer: BLUE CROSS/BLUE SHIELD | Admitting: Family

## 2015-08-03 ENCOUNTER — Encounter: Payer: Self-pay | Admitting: Family

## 2015-08-03 VITALS — BP 103/56 | HR 91 | Temp 97.7°F | Ht 60.0 in | Wt 112.0 lb

## 2015-08-03 DIAGNOSIS — R630 Anorexia: Secondary | ICD-10-CM | POA: Diagnosis not present

## 2015-08-03 DIAGNOSIS — R809 Proteinuria, unspecified: Secondary | ICD-10-CM | POA: Diagnosis not present

## 2015-08-03 DIAGNOSIS — R634 Abnormal weight loss: Secondary | ICD-10-CM | POA: Diagnosis not present

## 2015-08-03 DIAGNOSIS — K219 Gastro-esophageal reflux disease without esophagitis: Secondary | ICD-10-CM

## 2015-08-03 LAB — URINALYSIS, COMPLETE
BILIRUBIN UA: NEGATIVE
GLUCOSE, UA: NEGATIVE
KETONES UA: NEGATIVE
Leukocytes, UA: NEGATIVE
Nitrite, UA: NEGATIVE
PROTEIN UA: NEGATIVE
SPEC GRAV UA: 1.02 (ref 1.005–1.030)
UUROB: 0.2 mg/dL (ref 0.2–1.0)
pH, UA: 6.5 (ref 5.0–7.5)

## 2015-08-03 LAB — MICROSCOPIC EXAMINATION: BACTERIA UA: NONE SEEN

## 2015-08-03 MED ORDER — OMEPRAZOLE 20 MG PO CPDR: 20.0000 mg | DELAYED_RELEASE_CAPSULE | Freq: Every day | ORAL | Status: DC

## 2015-08-03 NOTE — Progress Notes (Signed)
   Subjective:    Patient ID: Catherine Haney, female    DOB: 05/15/1991, 25 y.o.   MRN: [id number]  HPI Pt presents to the office today to recheck proteinuria. Pt states she has an appt with nephrologists "late March". Pt denies any  More lower abdomen pain, vaginal bleeding, or flank pain. Pt states she has had a decrease in appetite in the last month. Pt states she has to force her self to eat. Pt reports a 10 lb weight loss. Pt denies any nausea, vomiting, anxiety, or recent illness. pT states she is having GERD symptoms with heartburn. Pt states she currently is taking zantac, but feels like she needs something stronger.    Review of Systems  Constitutional: Negative.   HENT: Negative.   Eyes: Negative.   Respiratory: Negative.  Negative for shortness of breath.   Cardiovascular: Negative.  Negative for palpitations.  Gastrointestinal: Negative.   Endocrine: Negative.   Genitourinary: Negative.   Musculoskeletal: Negative.   Neurological: Negative.  Negative for headaches.  Hematological: Negative.   Psychiatric/Behavioral: Negative.   All other systems reviewed and are negative.      Objective:   Physical Exam  Constitutional: She is oriented to person, place, and time. She appears well-developed and well-nourished. No distress.  HENT:  Head: Normocephalic and atraumatic.  Right Ear: External ear normal.  Left Ear: External ear normal.  Nose: Nose normal.  Mouth/Throat: Oropharynx is clear and moist.  Eyes: Pupils are equal, round, and reactive to light.  Neck: Normal range of motion. Neck supple. No thyromegaly present.  Cardiovascular: Normal rate, regular rhythm, normal heart sounds and intact distal pulses.   No murmur heard. Pulmonary/Chest: Effort normal and breath sounds normal. No respiratory distress. She has no wheezes.  Abdominal: Soft. Bowel sounds are normal. She exhibits no distension. There is no tenderness.  Musculoskeletal: Normal range of motion. She  exhibits no edema or tenderness.  Neurological: She is alert and oriented to person, place, and time. She has normal reflexes. No cranial nerve deficit.  Skin: Skin is warm and dry.  Psychiatric: She has a normal mood and affect. Her behavior is normal. Judgment and thought content normal.  Vitals reviewed.    BP 103/56 mmHg  Pulse 91  Temp(Src) 97.7 F (36.5 C) (Oral)  Ht 5' (1.524 m)  Wt 112 lb (50.803 kg)  BMI 21.87 kg/m2      Assessment & Plan:  1. Proteinuria - Urinalysis, Complete -Resolved today -Keep appt with nephrologists !  2. Decreased appetite -I believe this is related to uncontrolled GERD. Diet discussed and pt to stop zantac and start Prilosec 20 mg daily   3. Gastroesophageal reflux disease, esophagitis presence not specified -Diet discussed -Pt started on prilosec today - omeprazole (PRILOSEC) 20 MG capsule; Take 1 capsule (20 mg total) by mouth daily.  Dispense: 30 capsule; Refill: 3  4. Unexplained weight loss -Related to uncontrolled GERD?  Jannifer Rodneyhristy Zeah Germano, FNP

## 2015-08-03 NOTE — Patient Instructions (Signed)

## 2015-08-07 ENCOUNTER — Ambulatory Visit: Payer: BLUE CROSS/BLUE SHIELD | Admitting: Pediatrics

## 2015-08-19 ENCOUNTER — Ambulatory Visit: Payer: Self-pay | Admitting: Urology

## 2015-08-20 ENCOUNTER — Ambulatory Visit (INDEPENDENT_AMBULATORY_CARE_PROVIDER_SITE_OTHER): Payer: BLUE CROSS/BLUE SHIELD | Admitting: Family

## 2015-08-20 ENCOUNTER — Encounter: Payer: Self-pay | Admitting: Family

## 2015-08-20 VITALS — BP 98/68 | HR 103 | Temp 98.4°F | Ht 60.0 in | Wt 111.8 lb

## 2015-08-20 DIAGNOSIS — Z0000 Encounter for general adult medical examination without abnormal findings: Secondary | ICD-10-CM

## 2015-08-20 DIAGNOSIS — J029 Acute pharyngitis, unspecified: Secondary | ICD-10-CM

## 2015-08-20 DIAGNOSIS — Z01419 Encounter for gynecological examination (general) (routine) without abnormal findings: Secondary | ICD-10-CM

## 2015-08-20 DIAGNOSIS — K219 Gastro-esophageal reflux disease without esophagitis: Secondary | ICD-10-CM

## 2015-08-20 DIAGNOSIS — J309 Allergic rhinitis, unspecified: Secondary | ICD-10-CM

## 2015-08-20 DIAGNOSIS — F411 Generalized anxiety disorder: Secondary | ICD-10-CM

## 2015-08-20 MED ORDER — ESCITALOPRAM OXALATE 10 MG PO TABS: 10.0000 mg | ORAL_TABLET | Freq: Every day | ORAL | Status: DC

## 2015-08-20 MED ORDER — FLUTICASONE PROPIONATE 50 MCG/ACT NA SUSP: 2.0000 | Freq: Every day | NASAL | Status: DC

## 2015-08-20 MED ORDER — AZITHROMYCIN 250 MG PO TABS: ORAL_TABLET | ORAL | Status: DC

## 2015-08-20 NOTE — Progress Notes (Signed)
Subjective:    Patient ID: Catherine Haney, female    DOB: September 19, 1990, 25 y.o.   MRN: [id number]  Pt presents to the office today for CPE with a pap. Pt is complaining of insomnia for the last week. Pt reports that is seems hard "turn off her brain at night". PT reports feeling slightly anxious.  Gynecologic Exam Pertinent negatives include no headaches or nausea.  Gastroesophageal Reflux She reports no belching, no coughing, no heartburn or no nausea. This is a chronic problem. The current episode started more than 1 year ago. The problem occurs rarely. The problem has been resolved. The symptoms are aggravated by lying down. She has tried a PPI for the symptoms. The treatment provided moderate relief.  Anxiety Presents for follow-up visit. Onset was 1 to 5 years ago. The problem has been waxing and waning. Symptoms include excessive worry, insomnia and nervous/anxious behavior. Patient reports no depressed mood, dizziness, irritability, nausea, palpitations or shortness of breath. Symptoms occur occasionally. The severity of symptoms is moderate.   Her past medical history is significant for anxiety/panic attacks. Past treatments include nothing.  Sore Throat  This is a new problem. The current episode started 1 to 4 weeks ago. The problem has been unchanged. The maximum temperature recorded prior to her arrival was 100.4 - 100.9 F. The pain is at a severity of 7/10. The pain is mild. Pertinent negatives include no coughing, ear pain, headaches, plugged ear sensation or shortness of breath. She has had no exposure to strep or mono. She has tried acetaminophen and gargles for the symptoms. The treatment provided mild relief.      Review of Systems  Constitutional: Negative.  Negative for irritability.  HENT: Negative.  Negative for ear pain.   Eyes: Negative.   Respiratory: Negative.  Negative for cough and shortness of breath.   Cardiovascular: Negative.  Negative for palpitations.    Gastrointestinal: Negative.  Negative for heartburn and nausea.  Endocrine: Negative.   Genitourinary: Negative.   Musculoskeletal: Negative.   Neurological: Negative.  Negative for dizziness and headaches.  Hematological: Negative.   Psychiatric/Behavioral: The patient is nervous/anxious and has insomnia.   All other systems reviewed and are negative.      Objective:   Physical Exam  Constitutional: She is oriented to person, place, and time. She appears well-developed and well-nourished. No distress.  HENT:  Head: Normocephalic and atraumatic.  Right Ear: External ear normal.  Left Ear: External ear normal.  Nose: Nose normal.  Mouth/Throat: Oropharyngeal exudate present.  Nasal passage erythemas with mild swelling  Oropharynx erythemas   Eyes: Pupils are equal, round, and reactive to light.  Neck: Normal range of motion. Neck supple. No thyromegaly present.  Cardiovascular: Normal rate, regular rhythm, normal heart sounds and intact distal pulses.   No murmur heard. Pulmonary/Chest: Effort normal and breath sounds normal. No respiratory distress. She has no wheezes. Right breast exhibits no inverted nipple, no mass, no nipple discharge, no skin change and no tenderness. Left breast exhibits no inverted nipple, no mass, no nipple discharge, no skin change and no tenderness. Breasts are symmetrical.  Abdominal: Soft. Bowel sounds are normal. She exhibits no distension. There is no tenderness.  Genitourinary: Vagina normal. No vaginal discharge found.  Bimanual exam- no adnexal masses or tenderness, ovaries nonpalpable   Cervix parous and pink- No discharge   Musculoskeletal: Normal range of motion. She exhibits no edema or tenderness.  Neurological: She is alert and oriented to person, place, and time.  She has normal reflexes. No cranial nerve deficit.  Skin: Skin is warm and dry.  Psychiatric: She has a normal mood and affect. Her behavior is normal. Judgment and thought  content normal.  Vitals reviewed.    BP 98/68 mmHg  Pulse 103  Temp(Src) 98.4 F (36.9 C) (Oral)  Ht 5' (1.524 m)  Wt 111 lb 12.8 oz (50.712 kg)  BMI 21.83 kg/m2  LMP 10/22/2014      Assessment & Plan:  1. Annual physical exam - Anemia Profile B - CMP14+EGFR - Lipid panel - Thyroid Panel With TSH - VITAMIN D 25 Hydroxy (Vit-D Deficiency, Fractures) - Pap IG, CT/NG w/ reflex HPV when ASC-U  2. Gastroesophageal reflux disease, esophagitis presence not specified - CMP14+EGFR  3. Acute pharyngitis, unspecified etiology - Take meds as prescribed - Use a cool mist humidifier  -Use saline nose sprays frequently -Saline irrigations of the nose can be very helpful if done frequently.  * 4X daily for 1 week*  * Use of a nettie pot can be helpful with this. Follow directions with this* -Force fluids -For any cough or congestion  Use plain Mucinex- regular strength or max strength is fine   * Children- consult with Pharmacist for dosing -For fever or aces or pains- take tylenol or ibuprofen appropriate for age and weight.  * for fevers greater than 101 orally you may alternate ibuprofen and tylenol every  3 hours. -Throat lozenges if help -New toothbrush in 3 days - azithromycin (ZITHROMAX Z-PAK) 250 MG tablet; As directed  Dispense: 1 each; Refill: 0 - fluticasone (FLONASE) 50 MCG/ACT nasal spray; Place 2 sprays into both nostrils daily.  Dispense: 16 g; Refill: 6 - CMP14+EGFR  4. Allergic rhinitis, unspecified allergic rhinitis type - fluticasone (FLONASE) 50 MCG/ACT nasal spray; Place 2 sprays into both nostrils daily.  Dispense: 16 g; Refill: 6 - CMP14+EGFR  5. Generalized anxiety disorder Pt started on lexapro 10 mg today for GAD -Stress management discussed -RTO in 4-6 weeks - escitalopram (LEXAPRO) 10 MG tablet; Take 1 tablet (10 mg total) by mouth daily.  Dispense: 90 tablet; Refill: 3 - CMP14+EGFR  6. Encounter for routine gynecological examination -  CMP14+EGFR - Pap IG, CT/NG w/ reflex HPV when ASC-U   Continue all meds Labs pending Health Maintenance reviewed Diet and exercise encouraged RTO 4-6 weeks  Evelina Dun, FNP

## 2015-08-20 NOTE — Patient Instructions (Signed)
Health Maintenance, Female Adopting a healthy lifestyle and getting preventive care can go a long way to promote health and wellness. Talk with your health care provider about what schedule of regular examinations is right for you. This is a good chance for you to check in with your provider about disease prevention and staying healthy. In between checkups, there are plenty of things you can do on your own. Experts have done a lot of research about which lifestyle changes and preventive measures are most likely to keep you healthy. Ask your health care provider for more information. WEIGHT AND DIET  Eat a healthy diet  Be sure to include plenty of vegetables, fruits, low-fat dairy products, and lean protein.  Do not eat a lot of foods high in solid fats, added sugars, or salt.  Get regular exercise. This is one of the most important things you can do for your health.  Most adults should exercise for at least 150 minutes each week. The exercise should increase your heart rate and make you sweat (moderate-intensity exercise).  Most adults should also do strengthening exercises at least twice a week. This is in addition to the moderate-intensity exercise.  Maintain a healthy weight  Body mass index (BMI) is a measurement that can be used to identify possible weight problems. It estimates body fat based on height and weight. Your health care provider can help determine your BMI and help you achieve or maintain a healthy weight.  For females 20 years of age and older:   A BMI below 18.5 is considered underweight.  A BMI of 18.5 to 24.9 is normal.  A BMI of 25 to 29.9 is considered overweight.  A BMI of 30 and above is considered obese.  Watch levels of cholesterol and blood lipids  You should start having your blood tested for lipids and cholesterol at 26 years of age, then have this test every 5 years.  You may need to have your cholesterol levels checked more often if:  Your lipid  or cholesterol levels are high.  You are older than 26 years of age.  You are at high risk for heart disease.  CANCER SCREENING   Lung Cancer  Lung cancer screening is recommended for adults 55-80 years old who are at high risk for lung cancer because of a history of smoking.  A yearly low-dose CT scan of the lungs is recommended for people who:  Currently smoke.  Have quit within the past 15 years.  Have at least a 30-pack-year history of smoking. A pack year is smoking an average of one pack of cigarettes a day for 1 year.  Yearly screening should continue until it has been 15 years since you quit.  Yearly screening should stop if you develop a health problem that would prevent you from having lung cancer treatment.  Breast Cancer  Practice breast self-awareness. This means understanding how your breasts normally appear and feel.  It also means doing regular breast self-exams. Let your health care provider know about any changes, no matter how small.  If you are in your 20s or 30s, you should have a clinical breast exam (CBE) by a health care provider every 1-3 years as part of a regular health exam.  If you are 40 or older, have a CBE every year. Also consider having a breast X-ray (mammogram) every year.  If you have a family history of breast cancer, talk to your health care provider about genetic screening.  If you   are at high risk for breast cancer, talk to your health care provider about having an MRI and a mammogram every year.  Breast cancer gene (BRCA) assessment is recommended for women who have family members with BRCA-related cancers. BRCA-related cancers include:  Breast.  Ovarian.  Tubal.  Peritoneal cancers.  Results of the assessment will determine the need for genetic counseling and BRCA1 and BRCA2 testing. Cervical Cancer Your health care provider may recommend that you be screened regularly for cancer of the pelvic organs (ovaries, uterus, and  vagina). This screening involves a pelvic examination, including checking for microscopic changes to the surface of your cervix (Pap test). You may be encouraged to have this screening done every 3 years, beginning at age 21.  For women ages 30-65, health care providers may recommend pelvic exams and Pap testing every 3 years, or they may recommend the Pap and pelvic exam, combined with testing for human papilloma virus (HPV), every 5 years. Some types of HPV increase your risk of cervical cancer. Testing for HPV may also be done on women of any age with unclear Pap test results.  Other health care providers may not recommend any screening for nonpregnant women who are considered low risk for pelvic cancer and who do not have symptoms. Ask your health care provider if a screening pelvic exam is right for you.  If you have had past treatment for cervical cancer or a condition that could lead to cancer, you need Pap tests and screening for cancer for at least 20 years after your treatment. If Pap tests have been discontinued, your risk factors (such as having a new sexual partner) need to be reassessed to determine if screening should resume. Some women have medical problems that increase the chance of getting cervical cancer. In these cases, your health care provider may recommend more frequent screening and Pap tests. Colorectal Cancer  This type of cancer can be detected and often prevented.  Routine colorectal cancer screening usually begins at 26 years of age and continues through 26 years of age.  Your health care provider may recommend screening at an earlier age if you have risk factors for colon cancer.  Your health care provider may also recommend using home test kits to check for hidden blood in the stool.  A small camera at the end of a tube can be used to examine your colon directly (sigmoidoscopy or colonoscopy). This is done to check for the earliest forms of colorectal  cancer.  Routine screening usually begins at age 50.  Direct examination of the colon should be repeated every 5-10 years through 26 years of age. However, you may need to be screened more often if early forms of precancerous polyps or small growths are found. Skin Cancer  Check your skin from head to toe regularly.  Tell your health care provider about any new moles or changes in moles, especially if there is a change in a mole's shape or color.  Also tell your health care provider if you have a mole that is larger than the size of a pencil eraser.  Always use sunscreen. Apply sunscreen liberally and repeatedly throughout the day.  Protect yourself by wearing long sleeves, pants, a wide-brimmed hat, and sunglasses whenever you are outside. HEART DISEASE, DIABETES, AND HIGH BLOOD PRESSURE   High blood pressure causes heart disease and increases the risk of stroke. High blood pressure is more likely to develop in:  People who have blood pressure in the high end   of the normal range (130-139/85-89 mm Hg).  People who are overweight or obese.  People who are African American.  If you are 38-23 years of age, have your blood pressure checked every 3-5 years. If you are 61 years of age or older, have your blood pressure checked every year. You should have your blood pressure measured twice--once when you are at a hospital or clinic, and once when you are not at a hospital or clinic. Record the average of the two measurements. To check your blood pressure when you are not at a hospital or clinic, you can use:  An automated blood pressure machine at a pharmacy.  A home blood pressure monitor.  If you are between 45 years and 39 years old, ask your health care provider if you should take aspirin to prevent strokes.  Have regular diabetes screenings. This involves taking a blood sample to check your fasting blood sugar level.  If you are at a normal weight and have a low risk for diabetes,  have this test once every three years after 26 years of age.  If you are overweight and have a high risk for diabetes, consider being tested at a younger age or more often. PREVENTING INFECTION  Hepatitis B  If you have a higher risk for hepatitis B, you should be screened for this virus. You are considered at high risk for hepatitis B if:  You were born in a country where hepatitis B is common. Ask your health care provider which countries are considered high risk.  Your parents were born in a high-risk country, and you have not been immunized against hepatitis B (hepatitis B vaccine).  You have HIV or AIDS.  You use needles to inject street drugs.  You live with someone who has hepatitis B.  You have had sex with someone who has hepatitis B.  You get hemodialysis treatment.  You take certain medicines for conditions, including cancer, organ transplantation, and autoimmune conditions. Hepatitis C  Blood testing is recommended for:  Everyone born from [birth year] through 1966.  Anyone with known risk factors for hepatitis C. Sexually transmitted infections (STIs)  You should be screened for sexually transmitted infections (STIs) including gonorrhea and chlamydia if:  You are sexually active and are younger than 26 years of age.  You are older than 26 years of age and your health care provider tells you that you are at risk for this type of infection.  Your sexual activity has changed since you were last screened and you are at an increased risk for chlamydia or gonorrhea. Ask your health care provider if you are at risk.  If you do not have HIV, but are at risk, it may be recommended that you take a prescription medicine daily to prevent HIV infection. This is called pre-exposure prophylaxis (PrEP). You are considered at risk if:  You are sexually active and do not regularly use condoms or know the HIV status of your partner(s).  You take drugs by injection.  You are sexually  active with a partner who has HIV. Talk with your health care provider about whether you are at high risk of being infected with HIV. If you choose to begin PrEP, you should first be tested for HIV. You should then be tested every 3 months for as long as you are taking PrEP.  PREGNANCY   If you are premenopausal and you may become pregnant, ask your health care provider about preconception counseling.  If you may  become pregnant, take 400 to 800 micrograms (mcg) of folic acid every day.  If you want to prevent pregnancy, talk to your health care provider about birth control (contraception). OSTEOPOROSIS AND MENOPAUSE   Osteoporosis is a disease in which the bones lose minerals and strength with aging. This can result in serious bone fractures. Your risk for osteoporosis can be identified using a bone density scan.  If you are 61 years of age or older, or if you are at risk for osteoporosis and fractures, ask your health care provider if you should be screened.  Ask your health care provider whether you should take a calcium or vitamin D supplement to lower your risk for osteoporosis.  Menopause may have certain physical symptoms and risks.  Hormone replacement therapy may reduce some of these symptoms and risks. Talk to your health care provider about whether hormone replacement therapy is right for you.  HOME CARE INSTRUCTIONS   Schedule regular health, dental, and eye exams.  Stay current with your immunizations.   Do not use any tobacco products including cigarettes, chewing tobacco, or electronic cigarettes.  If you are pregnant, do not drink alcohol.  If you are breastfeeding, limit how much and how often you drink alcohol.  Limit alcohol intake to no more than 1 drink per day for nonpregnant women. One drink equals 12 ounces of beer, 5 ounces of wine, or 1 ounces of hard liquor.  Do not use street drugs.  Do not share needles.  Ask your health care provider for help if  you need support or information about quitting drugs.  Tell your health care provider if you often feel depressed.  Tell your health care provider if you have ever been abused or do not feel safe at home.   This information is not intended to replace advice given to you by your health care provider. Make sure you discuss any questions you have with your health care provider.   Document Released: 11/22/2010 Document Revised: 05/30/2014 Document Reviewed: 04/10/2013 Elsevier Interactive Patient Education [employer].

## 2015-08-21 LAB — ANEMIA PROFILE B
Basophils Absolute: 0 10*3/uL (ref 0.0–0.2)
Basos: 1 %
EOS (ABSOLUTE): 0 10*3/uL (ref 0.0–0.4)
EOS: 1 %
Ferritin: 48 ng/mL (ref 15–150)
Folate: >20 ng/mL (ref >3.0)
HEMOGLOBIN: 13.3 g/dL (ref 11.1–15.9)
Hematocrit: 40.3 % (ref 34.0–46.6)
IMMATURE GRANS (ABS): 0 10*3/uL (ref 0.0–0.1)
IMMATURE GRANULOCYTES: 0 %
IRON SATURATION: 22 % (ref 15–55)
IRON: 76 ug/dL (ref 27–159)
LYMPHS ABS: 1.6 10*3/uL (ref 0.7–3.1)
Lymphs: 39 %
MCH: 28.1 pg (ref 26.6–33.0)
MCHC: 33 g/dL (ref 31.5–35.7)
MCV: 85 fL (ref 79–97)
Monocytes Absolute: 0.3 10*3/uL (ref 0.1–0.9)
Monocytes: 6 %
NEUTROS PCT: 53 %
Neutrophils Absolute: 2.2 10*3/uL (ref 1.4–7.0)
Platelets: 393 10*3/uL — ABNORMAL HIGH (ref 150–379)
RBC: 4.73 x10E6/uL (ref 3.77–5.28)
RDW: 12.7 % (ref 12.3–15.4)
RETIC CT PCT: 0.7 % (ref 0.6–2.6)
TIBC: 338 ug/dL (ref 250–450)
UIBC: 262 ug/dL (ref 131–425)
Vitamin B-12: 448 pg/mL (ref 211–946)
WBC: 4.1 10*3/uL (ref 3.4–10.8)

## 2015-08-21 LAB — CMP14+EGFR
ALBUMIN: 4.6 g/dL (ref 3.5–5.5)
ALT: 12 IU/L (ref 0–32)
AST: 17 IU/L (ref 0–40)
Albumin/Globulin Ratio: 1.3 (ref 1.2–2.2)
Alkaline Phosphatase: 81 IU/L (ref 39–117)
BUN / CREAT RATIO: 16 (ref 8–20)
BUN: 11 mg/dL (ref 6–20)
Bilirubin Total: 0.4 mg/dL (ref 0.0–1.2)
CALCIUM: 9.7 mg/dL (ref 8.7–10.2)
CO2: 21 mmol/L (ref 18–29)
CREATININE: 0.67 mg/dL (ref 0.57–1.00)
Chloride: 101 mmol/L (ref 96–106)
GFR calc Af Amer: 141 mL/min/{1.73_m2} (ref >59)
GFR calc non Af Amer: 123 mL/min/{1.73_m2} (ref >59)
GLOBULIN, TOTAL: 3.5 g/dL (ref 1.5–4.5)
Glucose: 106 mg/dL — ABNORMAL HIGH (ref 65–99)
POTASSIUM: 4.5 mmol/L (ref 3.5–5.2)
SODIUM: 140 mmol/L (ref 134–144)
TOTAL PROTEIN: 8.1 g/dL (ref 6.0–8.5)

## 2015-08-21 LAB — LIPID PANEL
CHOL/HDL RATIO: 4.2 ratio (ref 0.0–4.4)
CHOLESTEROL TOTAL: 199 mg/dL (ref 100–199)
HDL: 47 mg/dL (ref >39)
LDL CALC: 141 mg/dL — AB (ref 0–99)
Triglycerides: 56 mg/dL (ref 0–149)
VLDL Cholesterol Cal: 11 mg/dL (ref 5–40)

## 2015-08-21 LAB — THYROID PANEL WITH TSH
Free Thyroxine Index: 2.7 (ref 1.2–4.9)
T3 UPTAKE RATIO: 28 % (ref 24–39)
T4 TOTAL: 9.7 ug/dL (ref 4.5–12.0)
TSH: 2.7 u[IU]/mL (ref 0.450–4.500)

## 2015-08-21 LAB — VITAMIN D 25 HYDROXY (VIT D DEFICIENCY, FRACTURES): Vit D, 25-Hydroxy: 18.3 ng/mL — ABNORMAL LOW (ref 30.0–100.0)

## 2015-08-25 ENCOUNTER — Ambulatory Visit: Payer: BLUE CROSS/BLUE SHIELD | Admitting: Family Medicine

## 2015-08-26 ENCOUNTER — Ambulatory Visit (INDEPENDENT_AMBULATORY_CARE_PROVIDER_SITE_OTHER): Payer: BLUE CROSS/BLUE SHIELD | Admitting: Family Medicine

## 2015-08-26 ENCOUNTER — Encounter: Payer: Self-pay | Admitting: Family Medicine

## 2015-08-26 ENCOUNTER — Encounter (INDEPENDENT_AMBULATORY_CARE_PROVIDER_SITE_OTHER): Payer: Self-pay

## 2015-08-26 ENCOUNTER — Ambulatory Visit: Payer: BLUE CROSS/BLUE SHIELD | Admitting: Family Medicine

## 2015-08-26 VITALS — BP 121/80 | HR 97 | Temp 98.8°F | Ht 60.0 in | Wt 109.8 lb

## 2015-08-26 DIAGNOSIS — F411 Generalized anxiety disorder: Secondary | ICD-10-CM

## 2015-08-26 DIAGNOSIS — G4700 Insomnia, unspecified: Secondary | ICD-10-CM | POA: Diagnosis not present

## 2015-08-26 LAB — PAP IG, CT-NG, RFX HPV ASCU
Chlamydia, Nuc. Acid Amp: NEGATIVE
Gonococcus by Nucleic Acid Amp: NEGATIVE
PAP SMEAR COMMENT: 0

## 2015-08-26 MED ORDER — TRAZODONE HCL 50 MG PO TABS: 25.0000 mg | ORAL_TABLET | Freq: Every evening | ORAL | Status: DC | PRN

## 2015-08-26 NOTE — Progress Notes (Signed)
BP 121/80 mmHg  Pulse 97  Temp(Src) 98.8 F (37.1 C) (Oral)  Ht 5' (1.524 m)  Wt 109 lb 12.8 oz (49.805 kg)  BMI 21.44 kg/m2  LMP 10/22/2014   Subjective:    Patient ID: Catherine Haney, female    DOB: December 29, 1990, 25 y.o.   MRN: [id number]  HPI: Catherine Haney is a 25 y.o. female presenting on 08/26/2015 for Anxiety   HPI Anxiety and insomnia Patient has been having a lot of anxiety and difficulty sleeping because of the anxiety since her son has been having trouble with speech and been going through the workup and diagnostic criteria for autism. He has been doing a lot better and so she is starting to feel a lot less anxious. She tried the Lexapro that was given for 4 days and felt like it made her more anxious and having more panic attacks and does not want to take it. She stopped it 2 days ago. She says her biggest thing now is the insomnia and difficulty calming down at night and that she does okay throughout the day. She says before going through this with HER-70-year-old son she did not have any issues with anxiety or depression throughout her life. She denies any thoughts of suicide or hurting herself. This was all obtained through a translator.  Relevant past medical, surgical, family and social history reviewed and updated as indicated. Interim medical history since our last visit reviewed. Allergies and medications reviewed and updated.  Review of Systems  Constitutional: Negative for fever and chills.  HENT: Negative for congestion, ear discharge and ear pain.   Eyes: Negative for redness and visual disturbance.  Respiratory: Negative for chest tightness and shortness of breath.   Cardiovascular: Negative for chest pain and leg swelling.  Genitourinary: Negative for dysuria and difficulty urinating.  Musculoskeletal: Negative for back pain and gait problem.  Skin: Negative for rash.  Neurological: Negative for light-headedness and headaches.  Psychiatric/Behavioral: Positive  for sleep disturbance and dysphoric mood. Negative for suicidal ideas, hallucinations, behavioral problems, self-injury, decreased concentration and agitation. The patient is nervous/anxious. The patient is not hyperactive.   All other systems reviewed and are negative.   Per HPI unless specifically indicated above     Medication List       This list is accurate as of: 08/26/15  2:52 PM.  Always use your most recent med list.               COLLAGEN-VITAMIN C PO  Take by mouth. Reported on 07/24/2015     fluticasone 50 MCG/ACT nasal spray  Commonly known as:  FLONASE  Place 2 sprays into both nostrils daily.     omeprazole 20 MG capsule  Commonly known as:  PRILOSEC  Take 1 capsule (20 mg total) by mouth daily.     traZODone 50 MG tablet  Commonly known as:  DESYREL  Take 0.5-1 tablets (25-50 mg total) by mouth at bedtime as needed for sleep.           Objective:    BP 121/80 mmHg  Pulse 97  Temp(Src) 98.8 F (37.1 C) (Oral)  Ht 5' (1.524 m)  Wt 109 lb 12.8 oz (49.805 kg)  BMI 21.44 kg/m2  LMP 10/22/2014  Wt Readings from Last 3 Encounters:  08/26/15 109 lb 12.8 oz (49.805 kg)  08/20/15 111 lb 12.8 oz (50.712 kg)  08/03/15 112 lb (50.803 kg)    Physical Exam  Constitutional: She is oriented to person, place,  and time. She appears well-developed and well-nourished. No distress.  Eyes: Conjunctivae and EOM are normal. Pupils are equal, round, and reactive to light.  Neck: Neck supple. No thyromegaly present.  Cardiovascular: Normal rate, regular rhythm, normal heart sounds and intact distal pulses.   No murmur heard. Pulmonary/Chest: Effort normal and breath sounds normal. No respiratory distress. She has no wheezes.  Musculoskeletal: Normal range of motion. She exhibits no edema or tenderness.  Lymphadenopathy:    She has no cervical adenopathy.  Neurological: She is alert and oriented to person, place, and time. Coordination normal.  Skin: Skin is warm and  dry. No rash noted. She is not diaphoretic.  Psychiatric: Her behavior is normal. Judgment and thought content normal. Her mood appears anxious. She exhibits a depressed mood. She expresses no suicidal ideation. She expresses no suicidal plans.  Nursing note and vitals reviewed.     Assessment & Plan:   Problem List Items Addressed This Visit      Other   Generalized anxiety disorder - Primary   Relevant Medications   traZODone (DESYREL) 50 MG tablet    Other Visit Diagnoses    Insomnia        Relevant Medications    traZODone (DESYREL) 50 MG tablet        Follow up plan: Return in about 2 months (around 10/26/2015), or if symptoms worsen or fail to improve, for Come back after vacation in the end of May.  Counseling provided for all of the vaccine components No orders of the defined types were placed in this encounter.    Caryl Pina, MD [facility] 08/26/2015, 2:52 PM

## 2015-08-27 ENCOUNTER — Other Ambulatory Visit: Payer: Self-pay | Admitting: Family

## 2015-08-27 DIAGNOSIS — E785 Hyperlipidemia, unspecified: Secondary | ICD-10-CM | POA: Insufficient documentation

## 2015-08-27 DIAGNOSIS — E559 Vitamin D deficiency, unspecified: Secondary | ICD-10-CM

## 2015-08-27 MED ORDER — VITAMIN D (ERGOCALCIFEROL) 1.25 MG (50000 UNIT) PO CAPS: 50000.0000 [IU] | ORAL_CAPSULE | ORAL | Status: DC

## 2015-10-21 ENCOUNTER — Ambulatory Visit (INDEPENDENT_AMBULATORY_CARE_PROVIDER_SITE_OTHER): Payer: BLUE CROSS/BLUE SHIELD | Admitting: Urology

## 2015-10-21 DIAGNOSIS — R102 Pelvic and perineal pain: Secondary | ICD-10-CM | POA: Diagnosis not present

## 2015-10-21 DIAGNOSIS — R3129 Other microscopic hematuria: Secondary | ICD-10-CM | POA: Diagnosis not present

## 2015-10-21 DIAGNOSIS — R809 Proteinuria, unspecified: Secondary | ICD-10-CM | POA: Diagnosis not present

## 2015-11-05 ENCOUNTER — Encounter: Payer: BLUE CROSS/BLUE SHIELD | Admitting: Family Medicine

## 2015-11-06 ENCOUNTER — Encounter: Payer: Self-pay | Admitting: Family Medicine

## 2015-12-17 ENCOUNTER — Other Ambulatory Visit: Payer: Self-pay | Admitting: *Deleted

## 2015-12-17 DIAGNOSIS — K219 Gastro-esophageal reflux disease without esophagitis: Secondary | ICD-10-CM

## 2015-12-17 MED ORDER — OMEPRAZOLE 20 MG PO CPDR: 20.0000 mg | DELAYED_RELEASE_CAPSULE | Freq: Every day | ORAL | 0 refills | Status: DC

## 2015-12-23 ENCOUNTER — Ambulatory Visit: Payer: BLUE CROSS/BLUE SHIELD | Admitting: Urology

## 2015-12-24 ENCOUNTER — Other Ambulatory Visit: Payer: Self-pay | Admitting: *Deleted

## 2015-12-24 DIAGNOSIS — K219 Gastro-esophageal reflux disease without esophagitis: Secondary | ICD-10-CM

## 2015-12-24 MED ORDER — OMEPRAZOLE 20 MG PO CPDR: 20.0000 mg | DELAYED_RELEASE_CAPSULE | Freq: Every day | ORAL | 0 refills | Status: DC

## 2016-01-03 ENCOUNTER — Emergency Department (HOSPITAL_COMMUNITY)
Admission: EM | Admit: 2016-01-03 | Discharge: 2016-01-04 | Disposition: A | Discharge status: Home / Self Care | Payer: BLUE CROSS/BLUE SHIELD | Attending: Emergency Medicine | Admitting: Emergency Medicine

## 2016-01-03 DIAGNOSIS — Z79899 Other long term (current) drug therapy: Secondary | ICD-10-CM | POA: Diagnosis not present

## 2016-01-03 DIAGNOSIS — N939 Abnormal uterine and vaginal bleeding, unspecified: Secondary | ICD-10-CM | POA: Diagnosis present

## 2016-01-03 DIAGNOSIS — R102 Pelvic and perineal pain: Secondary | ICD-10-CM | POA: Diagnosis not present

## 2016-01-04 ENCOUNTER — Emergency Department (HOSPITAL_COMMUNITY): Payer: BLUE CROSS/BLUE SHIELD

## 2016-01-04 ENCOUNTER — Encounter (HOSPITAL_COMMUNITY): Payer: Self-pay | Admitting: Emergency Medicine

## 2016-01-04 LAB — PREGNANCY, URINE: Preg Test, Ur: NEGATIVE

## 2016-01-04 LAB — WET PREP, GENITAL
Clue Cells Wet Prep HPF POC: NONE SEEN
Sperm: NONE SEEN
Trich, Wet Prep: NONE SEEN
Yeast Wet Prep HPF POC: NONE SEEN

## 2016-01-04 MED ORDER — IBUPROFEN 600 MG PO TABS: 600.0000 mg | ORAL_TABLET | Freq: Four times a day (QID) | ORAL | 0 refills | Status: DC | PRN

## 2016-01-04 NOTE — ED Triage Notes (Signed)
Pt c/o vaginal bleeding onset yesterday, pt reports seeing a string from what she believes was from her IUD come out. Pt reports having no periods for 8 month then having heavy period after lifting heavy items at home. Pt also reporting pain to lower abd and low back, similar to menstrual cramping per patient.  Pt also reporting cold s/s, sore throat dry cough, nasal congestion **arabic interpretor used**

## 2016-01-04 NOTE — ED Provider Notes (Signed)
[facility]-EMERGENCY DEPT Provider Note   CSN: [id number] Arrival date & time: 01/03/16  2323  First Provider Contact:  First MD Initiated Contact with Patient 01/04/16 0241        History   Chief Complaint Chief Complaint  Patient presents with  . Vaginal Bleeding    HPI Catherine Haney is a 25 y.o. female.  Patient presents with pelvic discomfort, vaginal bleeding and concern for IUD placement. She reports the IUD has been in for over 2 years without complication. Yesterday she discharged the strings from device and had onset of bleeding. No menses in 8 months, typical of history after IUD placement. Her pelvic pain is right sided. No nausea, vomiting or fever. She denies urinary symptoms.    The history is provided by the patient. A language interpreter was used (Arabic).  Vaginal Bleeding  Primary symptoms include pelvic pain, vaginal bleeding. There has been no fever. This is a new problem. The current episode started 12 to 24 hours ago. Pertinent negatives include no abdominal pain, no nausea and no vomiting.    Past Medical History:  Diagnosis Date  . GERD (gastroesophageal reflux disease)     Patient Active Problem List   Diagnosis Date Noted  . Vitamin D deficiency 08/27/2015  . Hyperlipidemia 08/27/2015  . GERD (gastroesophageal reflux disease) 08/20/2015  . Generalized anxiety disorder 07/24/2015  . Other seasonal allergic rhinitis 03/18/2015  . Chronic pain of multiple joints 03/18/2015    History reviewed. No pertinent surgical history.  OB History    No data available       Home Medications    Prior to Admission medications   Medication Sig Start Date End Date Taking? Authorizing Provider  omeprazole (PRILOSEC) 20 MG capsule Take 1 capsule (20 mg total) by mouth daily. 12/24/15  Yes Elige Radon Dettinger, MD  fluticasone (FLONASE) 50 MCG/ACT nasal spray Place 2 sprays into both nostrils daily. Patient not taking: Reported on 01/03/2016 08/20/15   Junie Spencer, FNP  traZODone (DESYREL) 50 MG tablet Take 0.5-1 tablets (25-50 mg total) by mouth at bedtime as needed for sleep. Patient not taking: Reported on 01/03/2016 08/26/15   Elige Radon Dettinger, MD  Vitamin D, Ergocalciferol, (DRISDOL) 50000 units CAPS capsule Take 1 capsule (50,000 Units total) by mouth every 7 (seven) days. Patient not taking: Reported on 01/03/2016 08/27/15   Junie Spencer, FNP    Family History Family History  Problem Relation Age of Onset  . Hypertension Mother   . Diabetes Father     Social History Social History  Substance Use Topics  . Smoking status: Never Smoker  . Smokeless tobacco: Never Used  . Alcohol use No     Allergies   Review of patient's allergies indicates no known allergies.   Review of Systems Review of Systems  Constitutional: Negative for chills and fever.  Gastrointestinal: Negative.  Negative for abdominal pain, nausea and vomiting.  Genitourinary: Positive for pelvic pain and vaginal bleeding.       See HPI.  Musculoskeletal: Negative.   Skin: Negative.   Neurological: Negative.      Physical Exam Updated Vital Signs BP 130/89 (BP Location: Right Arm)   Pulse 80   Temp 98.2 F (36.8 C) (Oral)   Resp 20   SpO2 100%   Physical Exam  Constitutional: She appears well-developed and well-nourished.  HENT:  Head: Normocephalic.  Neck: Normal range of motion. Neck supple.  Cardiovascular: Normal rate and regular rhythm.   Pulmonary/Chest: Effort  normal and breath sounds normal.  Abdominal: Soft. Bowel sounds are normal. There is no tenderness. There is no rebound and no guarding.  Genitourinary:  Genitourinary Comments: There are no IUD strings visualized. Cervix is inflamed with discharge that appears yellowish, thin. Right adnexal tenderness without palpable mass. No left adnexal tenderness. No CMT. No active cervical bleeding.  Musculoskeletal: Normal range of motion.  Neurological: She is alert. No cranial nerve deficit.   Skin: Skin is warm and dry. No rash noted.  Psychiatric: She has a normal mood and affect.     ED Treatments / Results  Labs (all labs ordered are listed, but only abnormal results are displayed) Labs Reviewed  WET PREP, GENITAL - Abnormal; Notable for the following:       Result Value   WBC, Wet Prep HPF POC MANY (*)    All other components within normal limits  PREGNANCY, URINE  GC/CHLAMYDIA PROBE AMP ([place] NOT [facility]   Results for orders placed or performed during the hospital encounter of 01/03/16  Wet prep, genital  Result Value Ref Range   Yeast Wet Prep HPF POC NONE SEEN NONE SEEN   Trich, Wet Prep NONE SEEN NONE SEEN   Clue Cells Wet Prep HPF POC NONE SEEN NONE SEEN   WBC, Wet Prep HPF POC MANY (A) NONE SEEN   Sperm NONE SEEN   Pregnancy, urine  Result Value Ref Range   Preg Test, Ur NEGATIVE NEGATIVE    EKG  EKG Interpretation None       Radiology No results found. [place] Transvaginal Non-ob  Result Date: 01/04/2016 CLINICAL DATA:  Initial evaluation for acute pelvic pain, vaginal bleeding knee. EXAM: TRANSABDOMINAL AND TRANSVAGINAL ULTRASOUND OF PELVIS TECHNIQUE: Both transabdominal and transvaginal ultrasound examinations of the pelvis were performed. Transabdominal technique was performed for global imaging of the pelvis including uterus, ovaries, adnexal regions, and pelvic cul-de-sac. It was necessary to proceed with endovaginal exam following the transabdominal exam to visualize the uterus and ovaries. COMPARISON:  None FINDINGS: Uterus Measurements: 7.6 x 4.4 x 5.1 cm. No fibroids or other mass visualized. Endometrium Thickness: 3.4 mm. No focal abnormality visualized. IUD in place, and appear to be appropriately positioned. Possible trace fluid noted within the endometrial canal, likely related history of vaginal bleeding, and felt to be of doubtful significance. Right ovary Measurements: 3.1 x 1.4 x 1.9 cm. Normal appearance/no adnexal mass. Left ovary  Measurements: 3.6 x 2.1 x 2.3 cm. Normal appearance/no adnexal mass. Other findings Trace free fluid, likely physiologic. IMPRESSION: 1. IUD in appropriate position within the uterus. 2. Otherwise unremarkable pelvic ultrasound. No acute abnormality identified. Electronically Signed   By: Rise MuBenjamin  McClintock M.D.   On: 01/04/2016 06:07   [place] Pelvis Complete  Result Date: 01/04/2016 CLINICAL DATA:  Initial evaluation for acute pelvic pain, vaginal bleeding knee. EXAM: TRANSABDOMINAL AND TRANSVAGINAL ULTRASOUND OF PELVIS TECHNIQUE: Both transabdominal and transvaginal ultrasound examinations of the pelvis were performed. Transabdominal technique was performed for global imaging of the pelvis including uterus, ovaries, adnexal regions, and pelvic cul-de-sac. It was necessary to proceed with endovaginal exam following the transabdominal exam to visualize the uterus and ovaries. COMPARISON:  None FINDINGS: Uterus Measurements: 7.6 x 4.4 x 5.1 cm. No fibroids or other mass visualized. Endometrium Thickness: 3.4 mm. No focal abnormality visualized. IUD in place, and appear to be appropriately positioned. Possible trace fluid noted within the endometrial canal, likely related history of vaginal bleeding, and felt to be of doubtful significance. Right ovary  Measurements: 3.1 x 1.4 x 1.9 cm. Normal appearance/no adnexal mass. Left ovary Measurements: 3.6 x 2.1 x 2.3 cm. Normal appearance/no adnexal mass. Other findings Trace free fluid, likely physiologic. IMPRESSION: 1. IUD in appropriate position within the uterus. 2. Otherwise unremarkable pelvic ultrasound. No acute abnormality identified. Electronically Signed   By: Rise MuBenjamin  McClintock M.D.   On: 01/04/2016 06:07    Procedures Procedures (including critical care time)  Medications Ordered in ED Medications - No data to display   Initial Impression / Assessment and Plan / ED Course  I have reviewed the triage vital signs and the nursing notes.  Pertinent  labs & imaging results that were available during my care of the patient were reviewed by me and considered in my medical decision making (see chart for details).  Clinical Course    Patient presents with complaint of pelvic pain, vaginal bleeding and having seen IUD strings fall from vagina and was concerned regarding IUD placement. No fever.   Exam is concerning for discharge and right adnexal tenderness. US ordered for further evaluation. Pregnancy negative.   US negative. Wet prep is showing many WBC's. No CMT - doubt PID. Patient is provided ibuprofen Rx for comfort. Recommended follow up with her doctor for recheck.   Final Clinical Impressions(s) / ED Diagnoses   Final diagnoses:  Pelvic pain in female    New Prescriptions New Prescriptions   No medications on file     Elpidio AnisShari Donyea Beverlin, PA-C 01/09/16 0400    Geoffery Lyonsouglas Delo, MD 01/12/16 1328

## 2016-01-05 ENCOUNTER — Encounter: Payer: Self-pay | Admitting: Family

## 2016-01-05 ENCOUNTER — Ambulatory Visit (INDEPENDENT_AMBULATORY_CARE_PROVIDER_SITE_OTHER): Payer: BLUE CROSS/BLUE SHIELD | Admitting: Family

## 2016-01-05 VITALS — BP 111/73 | HR 81 | Temp 97.4°F | Ht 60.0 in | Wt 114.0 lb

## 2016-01-05 DIAGNOSIS — R109 Unspecified abdominal pain: Secondary | ICD-10-CM

## 2016-01-05 DIAGNOSIS — J309 Allergic rhinitis, unspecified: Secondary | ICD-10-CM

## 2016-01-05 DIAGNOSIS — J029 Acute pharyngitis, unspecified: Secondary | ICD-10-CM

## 2016-01-05 DIAGNOSIS — R319 Hematuria, unspecified: Secondary | ICD-10-CM | POA: Diagnosis not present

## 2016-01-05 DIAGNOSIS — Z09 Encounter for follow-up examination after completed treatment for conditions other than malignant neoplasm: Secondary | ICD-10-CM

## 2016-01-05 DIAGNOSIS — R300 Dysuria: Secondary | ICD-10-CM

## 2016-01-05 LAB — URINALYSIS, COMPLETE
Bilirubin, UA: NEGATIVE
Glucose, UA: NEGATIVE
Ketones, UA: NEGATIVE
LEUKOCYTES UA: NEGATIVE
NITRITE UA: NEGATIVE
PH UA: 5.5 (ref 5.0–7.5)
Protein, UA: NEGATIVE
SPEC GRAV UA: 1.025 (ref 1.005–1.030)
Urobilinogen, Ur: 0.2 mg/dL (ref 0.2–1.0)

## 2016-01-05 LAB — MICROSCOPIC EXAMINATION

## 2016-01-05 LAB — GC/CHLAMYDIA PROBE AMP (~~LOC~~) NOT AT ARMC
Chlamydia: NEGATIVE
Neisseria Gonorrhea: NEGATIVE

## 2016-01-05 MED ORDER — FLUTICASONE PROPIONATE 50 MCG/ACT NA SUSP: 2.0000 | Freq: Every day | NASAL | 6 refills | Status: DC

## 2016-01-05 NOTE — Patient Instructions (Signed)

## 2016-01-05 NOTE — Progress Notes (Signed)
   Subjective:    Patient ID: Catherine Haney, female    DOB: August 25, 1990, 25 y.o.   MRN: [id number]  Pt presents to the office today for abdominal cramping, dysuria, hematuria, and low back pain. Pt was seen in the ED yesterday and her IUD was in place. Pt had negative pregnancy test and negative for yeast, BV, and and Trich. Pt's cervix was inflamed with discharge and had a negative  vaginal ultrasound that shown IUD in place.  Abdominal Cramping  This is a new problem. The current episode started in the past 7 days. The onset quality is gradual. The problem occurs intermittently. The pain is located in the suprapubic region. The pain is at a severity of 9/10. The abdominal pain radiates to the right flank. Associated symptoms include dysuria, hematuria, nausea and vomiting. Pertinent negatives include no belching, constipation, diarrhea or frequency. Associated symptoms comments: Back pain . The pain is aggravated by movement. The pain is relieved by being still. She has tried acetaminophen for the symptoms. The treatment provided no relief.    Print production plannerArabic translator used. Ed notes reviewed.   Review of Systems  Gastrointestinal: Positive for nausea and vomiting. Negative for constipation and diarrhea.  Genitourinary: Positive for dysuria and hematuria. Negative for frequency.  All other systems reviewed and are negative.      Objective:   Physical Exam  Constitutional: She is oriented to person, place, and time. She appears well-developed and well-nourished. No distress.  HENT:  Head: Normocephalic.  Eyes: Pupils are equal, round, and reactive to light.  Neck: Normal range of motion. Neck supple. No thyromegaly present.  Cardiovascular: Normal rate, regular rhythm, normal heart sounds and intact distal pulses.   No murmur heard. Pulmonary/Chest: Effort normal and breath sounds normal. No respiratory distress. She has no wheezes.  Abdominal: Soft. Bowel sounds are normal. She exhibits no  distension. There is no tenderness.  Musculoskeletal: Normal range of motion. She exhibits no edema or tenderness.  Negative CVA tenderness   Neurological: She is alert and oriented to person, place, and time.  Skin: Skin is warm and dry.  Psychiatric: She has a normal mood and affect. Her behavior is normal. Judgment and thought content normal.  Vitals reviewed.   BP 111/73   Pulse 81   Temp 97.4 F (36.3 C) (Oral)   Ht 5' (1.524 m)   Wt 114 lb (51.7 kg)   BMI 22.26 kg/m        Assessment & Plan:  1. Dysuria - Urinalysis, Complete - CBC with Differential/Platelet  2. Hematuria - Urinalysis, Complete - CBC with Differential/Platelet  3. Abdominal cramping - CBC with Differential/Platelet  4. Hospital discharge follow-up - CBC with Differential/Platelet  Urine is negative today  CBC pending I believe pt's cervix is inflamed and irritated and will resolve. Pt to follow up in 2 weeks if not improved.  Jannifer Rodneyhristy Tristan Bramble, FNP

## 2016-01-06 LAB — CBC WITH DIFFERENTIAL/PLATELET
BASOS ABS: 0 10*3/uL (ref 0.0–0.2)
Basos: 1 %
EOS (ABSOLUTE): 0.1 10*3/uL (ref 0.0–0.4)
Eos: 1 %
Hematocrit: 38.1 % (ref 34.0–46.6)
Hemoglobin: 12.8 g/dL (ref 11.1–15.9)
IMMATURE GRANS (ABS): 0 10*3/uL (ref 0.0–0.1)
Immature Granulocytes: 0 %
LYMPHS ABS: 2.2 10*3/uL (ref 0.7–3.1)
LYMPHS: 36 %
MCH: 28.7 pg (ref 26.6–33.0)
MCHC: 33.6 g/dL (ref 31.5–35.7)
MCV: 85 fL (ref 79–97)
Monocytes Absolute: 0.4 10*3/uL (ref 0.1–0.9)
Monocytes: 7 %
NEUTROS ABS: 3.3 10*3/uL (ref 1.4–7.0)
Neutrophils: 55 %
PLATELETS: 387 10*3/uL — AB (ref 150–379)
RBC: 4.46 x10E6/uL (ref 3.77–5.28)
RDW: 13.5 % (ref 12.3–15.4)
WBC: 6 10*3/uL (ref 3.4–10.8)

## 2016-01-12 ENCOUNTER — Encounter: Payer: Self-pay | Admitting: Family

## 2016-01-12 ENCOUNTER — Ambulatory Visit (INDEPENDENT_AMBULATORY_CARE_PROVIDER_SITE_OTHER): Payer: BLUE CROSS/BLUE SHIELD | Admitting: Family

## 2016-01-12 VITALS — BP 102/68 | HR 76 | Temp 97.5°F | Ht 60.0 in | Wt 115.2 lb

## 2016-01-12 DIAGNOSIS — J069 Acute upper respiratory infection, unspecified: Secondary | ICD-10-CM | POA: Diagnosis not present

## 2016-01-12 MED ORDER — AZITHROMYCIN 250 MG PO TABS: ORAL_TABLET | ORAL | 0 refills | Status: DC

## 2016-01-12 NOTE — Progress Notes (Signed)
   Subjective:    Patient ID: Catherine Haney, female    DOB: [birth year], 25 y.o.   MRN: [id number]  Sore Throat   This is a new problem. The current episode started 1 to 4 weeks ago. The problem has been waxing and waning. There has been no fever. The pain is at a severity of 8/10. The pain is moderate. Associated symptoms include coughing, ear pain (Right), headaches, a hoarse voice and trouble swallowing. Pertinent negatives include no ear discharge. She has tried acetaminophen for the symptoms. The treatment provided mild relief.  Headache   Associated symptoms include coughing, ear pain (Right), sinus pressure and a sore throat.      Review of Systems  HENT: Positive for ear pain (Right), hoarse voice, postnasal drip, sinus pressure, sore throat and trouble swallowing. Negative for ear discharge.   Respiratory: Positive for cough.   Neurological: Positive for headaches.  All other systems reviewed and are negative.      Objective:   Physical Exam  Constitutional: She is oriented to person, place, and time. She appears well-developed and well-nourished. No distress.  HENT:  Head: Normocephalic and atraumatic.  Right Ear: External ear normal.  Nose: Mucosal edema and rhinorrhea present. Right sinus exhibits frontal sinus tenderness. Left sinus exhibits frontal sinus tenderness.  Mouth/Throat: Posterior oropharyngeal edema and posterior oropharyngeal erythema present.  Eyes: Pupils are equal, round, and reactive to light.  Neck: Normal range of motion. Neck supple. No thyromegaly present.  Cardiovascular: Normal rate, regular rhythm, normal heart sounds and intact distal pulses.   No murmur heard. Pulmonary/Chest: Effort normal and breath sounds normal. No respiratory distress. She has no wheezes.  Abdominal: Soft. Bowel sounds are normal. She exhibits no distension. There is no tenderness.  Musculoskeletal: Normal range of motion. She exhibits no edema or tenderness.  Neurological:  She is alert and oriented to person, place, and time. She has normal reflexes. No cranial nerve deficit.  Skin: Skin is warm and dry.  Psychiatric: She has a normal mood and affect. Her behavior is normal. Judgment and thought content normal.  Vitals reviewed.     BP 102/68   Pulse 76   Temp 97.5 F (36.4 C) (Oral)   Ht 5' (1.524 m)   Wt 115 lb 3.2 oz (52.3 kg)   SpO2 100%   BMI 22.50 kg/m      Assessment & Plan:  1. URI (upper respiratory infection) -- Take meds as prescribed - Use a cool mist humidifier  -Use saline nose sprays frequently -Saline irrigations of the nose can be very helpful if done frequently.  * 4X daily for 1 week*  * Use of a nettie pot can be helpful with this. Follow directions with this* -Force fluids -For any cough or congestion  Use plain Mucinex- regular strength or max strength is fine   * Children- consult with Pharmacist for dosing -For fever or aces or pains- take tylenol or ibuprofen appropriate for age and weight.  * for fevers greater than 101 orally you may alternate ibuprofen and tylenol every  3 hours. -Throat lozenges if help -New toothbrush in 3 days - azithromycin (ZITHROMAX) 250 MG tablet; Take 500 mg once, then 250 mg for four days  Dispense: 6 tablet; Refill: 0  Jannifer Rodneyhristy Zamarah Ullmer, FNP

## 2016-01-12 NOTE — Patient Instructions (Signed)
Upper Respiratory Infection, Adult Most upper respiratory infections (URIs) are a viral infection of the air passages leading to the lungs. A URI affects the nose, throat, and upper air passages. The most common type of URI is nasopharyngitis and is typically referred to as "the common cold." URIs run their course and usually go away on their own. Most of the time, a URI does not require medical attention, but sometimes a bacterial infection in the upper airways can follow a viral infection. This is called a secondary infection. Sinus and middle ear infections are common types of secondary upper respiratory infections. Bacterial pneumonia can also complicate a URI. A URI can worsen asthma and chronic obstructive pulmonary disease (COPD). Sometimes, these complications can require emergency medical care and may be life threatening.  CAUSES Almost all URIs are caused by viruses. A virus is a type of germ and can spread from one person to another.  RISKS FACTORS You may be at risk for a URI if:   You smoke.   You have chronic heart or lung disease.  You have a weakened defense (immune) system.   You are very young or very old.   You have nasal allergies or asthma.  You work in crowded or poorly ventilated areas.  You work in health care facilities or schools. SIGNS AND SYMPTOMS  Symptoms typically develop 2-3 days after you come in contact with a cold virus. Most viral URIs last 7-10 days. However, viral URIs from the influenza virus (flu virus) can last 14-18 days and are typically more severe. Symptoms may include:   Runny or stuffy (congested) nose.   Sneezing.   Cough.   Sore throat.   Headache.   Fatigue.   Fever.   Loss of appetite.   Pain in your forehead, behind your eyes, and over your cheekbones (sinus pain).  Muscle aches.  DIAGNOSIS  Your health care provider may diagnose a URI by:  Physical exam.  Tests to check that your symptoms are not due to  another condition such as:  Strep throat.  Sinusitis.  Pneumonia.  Asthma. TREATMENT  A URI goes away on its own with time. It cannot be cured with medicines, but medicines may be prescribed or recommended to relieve symptoms. Medicines may help:  Reduce your fever.  Reduce your cough.  Relieve nasal congestion. HOME CARE INSTRUCTIONS   Take medicines only as directed by your health care provider.   Gargle warm saltwater or take cough drops to comfort your throat as directed by your health care provider.  Use a warm mist humidifier or inhale steam from a shower to increase air moisture. This may make it easier to breathe.  Drink enough fluid to keep your urine clear or pale yellow.   Eat soups and other clear broths and maintain good nutrition.   Rest as needed.   Return to work when your temperature has returned to normal or as your health care provider advises. You may need to stay home longer to avoid infecting others. You can also use a face mask and careful hand washing to prevent spread of the virus.  Increase the usage of your inhaler if you have asthma.   Do not use any tobacco products, including cigarettes, chewing tobacco, or electronic cigarettes. If you need help quitting, ask your health care provider. PREVENTION  The best way to protect yourself from getting a cold is to practice good hygiene.   Avoid oral or hand contact with people with cold   symptoms.   Wash your hands often if contact occurs.  There is no clear evidence that vitamin C, vitamin E, echinacea, or exercise reduces the chance of developing a cold. However, it is always recommended to get plenty of rest, exercise, and practice good nutrition.  SEEK MEDICAL CARE IF:   You are getting worse rather than better.   Your symptoms are not controlled by medicine.   You have chills.  You have worsening shortness of breath.  You have brown or red mucus.  You have yellow or brown nasal  discharge.  You have pain in your face, especially when you bend forward.  You have a fever.  You have swollen neck glands.  You have pain while swallowing.  You have white areas in the back of your throat. SEEK IMMEDIATE MEDICAL CARE IF:   You have severe or persistent:  Headache.  Ear pain.  Sinus pain.  Chest pain.  You have chronic lung disease and any of the following:  Wheezing.  Prolonged cough.  Coughing up blood.  A change in your usual mucus.  You have a stiff neck.  You have changes in your:  Vision.  Hearing.  Thinking.  Mood. MAKE SURE YOU:   Understand these instructions.  Will watch your condition.  Will get help right away if you are not doing well or get worse.   This information is not intended to replace advice given to you by your health care provider. Make sure you discuss any questions you have with your health care provider.   Document Released: 11/02/2000 Document Revised: 09/23/2014 Document Reviewed: 08/14/2013 Elsevier Interactive Patient Education 2016 [employer]  

## 2016-03-07 ENCOUNTER — Encounter: Payer: Self-pay | Admitting: Family

## 2016-03-07 ENCOUNTER — Ambulatory Visit (INDEPENDENT_AMBULATORY_CARE_PROVIDER_SITE_OTHER): Payer: BLUE CROSS/BLUE SHIELD | Admitting: Family

## 2016-03-07 VITALS — BP 104/64 | HR 77 | Temp 97.0°F | Ht 60.0 in | Wt 121.2 lb

## 2016-03-07 DIAGNOSIS — J020 Streptococcal pharyngitis: Secondary | ICD-10-CM

## 2016-03-07 MED ORDER — AMOXICILLIN 875 MG PO TABS: 875.0000 mg | ORAL_TABLET | Freq: Two times a day (BID) | ORAL | 0 refills | Status: DC

## 2016-03-07 NOTE — Progress Notes (Signed)
   Subjective:    Patient ID: Catherine Haney, female    DOB: Dec 26, 1990, 26 y.o.   MRN: [id number]  Sore Throat   This is a new problem. The current episode started 1 to 4 weeks ago. The problem has been waxing and waning. The maximum temperature recorded prior to her arrival was 100.4 - 100.9 F. The pain is at a severity of 8/10. Associated symptoms include congestion, coughing, ear pain (right ear), headaches, a hoarse voice, swollen glands and trouble swallowing. Pertinent negatives include no ear discharge. She has had exposure to strep. She has tried acetaminophen for the symptoms. The treatment provided mild relief.      Review of Systems  HENT: Positive for congestion, ear pain (right ear), hoarse voice and trouble swallowing. Negative for ear discharge.   Respiratory: Positive for cough.   Neurological: Positive for headaches.  All other systems reviewed and are negative.      Objective:   Physical Exam  Constitutional: She is oriented to person, place, and time. She appears well-developed and well-nourished. No distress.  HENT:  Head: Normocephalic and atraumatic.  Right Ear: External ear normal.  Left Ear: External ear normal.  Nose: Mucosal edema and rhinorrhea present.  Mouth/Throat: Uvula swelling present. Posterior oropharyngeal edema and posterior oropharyngeal erythema present.  Eyes: Pupils are equal, round, and reactive to light.  Neck: Normal range of motion. Neck supple. No thyromegaly present.  Cardiovascular: Normal rate, regular rhythm, normal heart sounds and intact distal pulses.   No murmur heard. Pulmonary/Chest: Effort normal and breath sounds normal. No respiratory distress. She has no wheezes.  Abdominal: Soft. Bowel sounds are normal. She exhibits no distension. There is no tenderness.  Musculoskeletal: Normal range of motion. She exhibits no edema or tenderness.  Neurological: She is alert and oriented to person, place, and time. She has normal  reflexes. No cranial nerve deficit.  Skin: Skin is warm and dry.  Psychiatric: She has a normal mood and affect. Her behavior is normal. Judgment and thought content normal.  Vitals reviewed.     BP 104/64   Pulse 77   Temp 97 F (36.1 C) (Oral)   Ht 5' (1.524 m)   Wt 121 lb 3.2 oz (55 kg)   BMI 23.67 kg/m      Assessment & Plan:  1. Streptococcal sore throat -- Take meds as prescribed - Use a cool mist humidifier  -Use saline nose sprays frequently -Saline irrigations of the nose can be very helpful if done frequently.  * 4X daily for 1 week*  * Use of a nettie pot can be helpful with this. Follow directions with this* -Force fluids -For any cough or congestion  Use plain Mucinex- regular strength or max strength is fine   * Children- consult with Pharmacist for dosing -For fever or aces or pains- take tylenol or ibuprofen appropriate for age and weight.  * for fevers greater than 101 orally you may alternate ibuprofen and tylenol every  3 hours. -Throat lozenges if help -New toothbrush in 3 days - amoxicillin (AMOXIL) 875 MG tablet; Take 1 tablet (875 mg total) by mouth 2 (two) times daily.  Dispense: 14 tablet; Refill: 0  Jannifer Rodneyhristy Karalina Tift, FNP

## 2016-03-07 NOTE — Patient Instructions (Signed)

## 2016-04-28 ENCOUNTER — Ambulatory Visit (INDEPENDENT_AMBULATORY_CARE_PROVIDER_SITE_OTHER): Payer: BLUE CROSS/BLUE SHIELD | Admitting: Family Medicine

## 2016-04-28 ENCOUNTER — Encounter: Payer: Self-pay | Admitting: Family Medicine

## 2016-04-28 VITALS — BP 112/74 | HR 80 | Temp 97.4°F | Ht 60.0 in | Wt 125.4 lb

## 2016-04-28 DIAGNOSIS — R1032 Left lower quadrant pain: Secondary | ICD-10-CM

## 2016-04-28 DIAGNOSIS — M62838 Other muscle spasm: Secondary | ICD-10-CM

## 2016-04-28 DIAGNOSIS — N926 Irregular menstruation, unspecified: Secondary | ICD-10-CM

## 2016-04-28 DIAGNOSIS — K219 Gastro-esophageal reflux disease without esophagitis: Secondary | ICD-10-CM

## 2016-04-28 LAB — URINALYSIS, COMPLETE
Bilirubin, UA: NEGATIVE
Glucose, UA: NEGATIVE
Ketones, UA: NEGATIVE
Leukocytes, UA: NEGATIVE
NITRITE UA: NEGATIVE
PH UA: 7 (ref 5.0–7.5)
PROTEIN UA: NEGATIVE
Specific Gravity, UA: 1.02 (ref 1.005–1.030)
UUROB: 1 mg/dL (ref 0.2–1.0)

## 2016-04-28 LAB — MICROSCOPIC EXAMINATION
Bacteria, UA: NONE SEEN
RENAL EPITHEL UA: NONE SEEN /HPF

## 2016-04-28 LAB — PREGNANCY, URINE: Preg Test, Ur: NEGATIVE

## 2016-04-28 MED ORDER — POLYETHYLENE GLYCOL 3350 17 GM/SCOOP PO POWD: 17.0000 g | Freq: Every day | ORAL | 1 refills | Status: DC | PRN

## 2016-04-28 MED ORDER — OMEPRAZOLE 20 MG PO CPDR: 20.0000 mg | DELAYED_RELEASE_CAPSULE | Freq: Two times a day (BID) | ORAL | 1 refills | Status: DC

## 2016-04-28 MED ORDER — TIZANIDINE HCL 2 MG PO CAPS: 2.0000 mg | ORAL_CAPSULE | Freq: Two times a day (BID) | ORAL | 1 refills | Status: DC | PRN

## 2016-04-28 NOTE — Progress Notes (Signed)
BP 112/74   Pulse 80   Temp 97.4 F (36.3 C) (Oral)   Ht 5' (1.524 m)   Wt 125 lb 6.4 oz (56.9 kg)   BMI 24.49 kg/m    Subjective:    Patient ID: Catherine Haney, female    DOB: 1990-12-13, 25 y.o.   MRN: [id number]  HPI: Catherine SparkDunia Haney is a 25 y.o. female presenting on 04/28/2016 for Abdominal and back pain (interpretar Husam 140030, no urinary symptoms, pain began about a month ago, some instances of diarrhea)   HPI Abdominal pain and bloating Patient has been having abdominal pain and bloating and belching that has been increased over the past month. She has been on omeprazole for GERD but feels like it is not working anymore area she denies any blood in her stool. She has had some intermittent diarrhea but denies any constipation. She was wondering if a laxative would help her as some friends have told her that. She denies any urinary burning or pain. She denies any fevers or chills. She denies any nausea or vomiting. She also would like to get a urine pregnancy test because she has missed her last 2 periods.  Neck pain Patient also comes in complaining of recurring neck pain/strain on bilateral muscles surrounding her neck. She has used meloxicam previously and it did help. She denies any tingling and numbness going down either of her arms. She denies any weakness in either of her arms or legs as well. She denies any looking down persistently throughout the day but also denies any regular stretching  Relevant past medical, surgical, family and social history reviewed and updated as indicated. Interim medical history since our last visit reviewed. Allergies and medications reviewed and updated.  Review of Systems  Constitutional: Negative for chills and fever.  Eyes: Negative for redness and visual disturbance.  Respiratory: Negative for chest tightness and shortness of breath.   Cardiovascular: Negative for chest pain and leg swelling.  Gastrointestinal: Positive for abdominal  distention, abdominal pain and diarrhea. Negative for blood in stool, constipation, nausea and vomiting.  Genitourinary: Positive for menstrual problem. Negative for difficulty urinating, dysuria, flank pain, hematuria, vaginal bleeding, vaginal discharge and vaginal pain.  Musculoskeletal: Positive for neck pain and neck stiffness. Negative for back pain and gait problem.  Skin: Negative for color change and rash.  Neurological: Negative for light-headedness and headaches.  Psychiatric/Behavioral: Negative for agitation and behavioral problems.  All other systems reviewed and are negative.   Per HPI unless specifically indicated above      Objective:    BP 112/74   Pulse 80   Temp 97.4 F (36.3 C) (Oral)   Ht 5' (1.524 m)   Wt 125 lb 6.4 oz (56.9 kg)   BMI 24.49 kg/m   Wt Readings from Last 3 Encounters:  04/28/16 125 lb 6.4 oz (56.9 kg)  03/07/16 121 lb 3.2 oz (55 kg)  01/12/16 115 lb 3.2 oz (52.3 kg)    Physical Exam  Constitutional: She is oriented to person, place, and time. She appears well-developed and well-nourished. No distress.  Eyes: Conjunctivae are normal.  Cardiovascular: Normal rate, regular rhythm, normal heart sounds and intact distal pulses.   No murmur heard. Pulmonary/Chest: Effort normal and breath sounds normal. No respiratory distress. She has no wheezes. She has no rales.  Abdominal: Soft. Bowel sounds are normal. She exhibits no distension. There is tenderness (Left lower quadrant tenderness, no CVA tenderness). There is no rebound and no guarding.  Musculoskeletal:  Normal range of motion. She exhibits no edema.       Cervical back: She exhibits tenderness (Bilateral paraspinal muscular tenderness). She exhibits normal range of motion, no deformity and normal pulse.  Neurological: She is alert and oriented to person, place, and time. Coordination normal.  Skin: Skin is warm and dry. No rash noted. She is not diaphoretic.  Psychiatric: She has a normal  mood and affect. Her behavior is normal.  Nursing note and vitals reviewed.  Urinalysis: 0-5 wbc's, 3-10 RBCs, 0-10 epithelial cells, otherwise completely negative.  Urine pregnancy: Negative    Assessment & Plan:   Problem List Items Addressed This Visit      Digestive   GERD (gastroesophageal reflux disease)   Relevant Medications   omeprazole (PRILOSEC) 20 MG capsule   polyethylene glycol powder (GLYCOLAX/MIRALAX) powder    Other Visit Diagnoses    Left lower quadrant pain    -  Primary   Relevant Medications   omeprazole (PRILOSEC) 20 MG capsule   polyethylene glycol powder (GLYCOLAX/MIRALAX) powder   Other Relevant Orders   Urinalysis, Complete   Neck muscle spasm       Relevant Medications   tizanidine (ZANAFLEX) 2 MG capsule   Missed period       Relevant Medications   tizanidine (ZANAFLEX) 2 MG capsule   Other Relevant Orders   Pregnancy, urine       Follow up plan: Return if symptoms worsen or fail to improve.  Counseling provided for all of the vaccine components Orders Placed This Encounter  Procedures  . Urinalysis, Complete  . Pregnancy, urine    Arville CareJoshua Chester Romero, MD [facility] 04/28/2016, 8:39 AM

## 2016-05-19 ENCOUNTER — Ambulatory Visit: Payer: BLUE CROSS/BLUE SHIELD | Admitting: Family

## 2016-05-20 ENCOUNTER — Encounter: Payer: Self-pay | Admitting: Family

## 2016-05-20 ENCOUNTER — Ambulatory Visit (INDEPENDENT_AMBULATORY_CARE_PROVIDER_SITE_OTHER): Payer: BLUE CROSS/BLUE SHIELD | Admitting: Family

## 2016-05-20 VITALS — BP 111/67 | HR 96 | Temp 97.1°F | Ht 60.0 in | Wt 131.0 lb

## 2016-05-20 DIAGNOSIS — M5442 Lumbago with sciatica, left side: Secondary | ICD-10-CM

## 2016-05-20 MED ORDER — NAPROXEN 500 MG PO TABS
500.0000 mg | ORAL_TABLET | Freq: Two times a day (BID) | ORAL | 1 refills | Status: DC
Start: 2016-05-20 — End: 2016-07-15

## 2016-05-20 MED ORDER — CYCLOBENZAPRINE HCL 5 MG PO TABS: 5.0000 mg | ORAL_TABLET | Freq: Three times a day (TID) | ORAL | 3 refills | Status: DC | PRN

## 2016-05-20 MED ORDER — KETOROLAC TROMETHAMINE 60 MG/2ML IM SOLN
60.0000 mg | Freq: Once | INTRAMUSCULAR | Status: AC
  Administered 2016-05-20: 60 mg via INTRAMUSCULAR

## 2016-05-20 MED ORDER — METHYLPREDNISOLONE ACETATE 80 MG/ML IJ SUSP
80.0000 mg | Freq: Once | INTRAMUSCULAR | Status: AC
  Administered 2016-05-20: 80 mg via INTRAMUSCULAR

## 2016-05-20 NOTE — Progress Notes (Signed)
   Subjective:    Patient ID: Catherine Haney, female    DOB: July 13, 1990, 25 y.o.   MRN: [id number]  Back Pain  This is a recurrent problem. The current episode started more than 1 month ago. The problem occurs constantly. The problem has been waxing and waning since onset. The pain is present in the lumbar spine. The quality of the pain is described as aching. The pain radiates to the left thigh. The pain is at a severity of 10/10. The pain is moderate. The symptoms are aggravated by lying down and standing. Associated symptoms include leg pain. Pertinent negatives include no bladder incontinence, bowel incontinence, dysuria, fever, numbness, tingling or weakness. She has tried ice and NSAIDs for the symptoms. The treatment provided mild relief.      Review of Systems  Constitutional: Negative for fever.  Gastrointestinal: Negative for bowel incontinence.  Genitourinary: Negative for bladder incontinence and dysuria.  Musculoskeletal: Positive for back pain.  Neurological: Negative for tingling, weakness and numbness.  All other systems reviewed and are negative.      Objective:   Physical Exam  Constitutional: She is oriented to person, place, and time. She appears well-developed and well-nourished. No distress.  HENT:  Head: Normocephalic.  Cardiovascular: Normal rate, regular rhythm, normal heart sounds and intact distal pulses.   No murmur heard. Pulmonary/Chest: Effort normal and breath sounds normal. No respiratory distress. She has no wheezes.  Abdominal: Soft. Bowel sounds are normal. She exhibits no distension. There is no tenderness.  Musculoskeletal: Normal range of motion. She exhibits tenderness (low back pain that radiates into  posterior left leg). She exhibits no edema.  Neurological: She is alert and oriented to person, place, and time.  Skin: Skin is warm and dry.  Psychiatric: She has a normal mood and affect. Her behavior is normal. Judgment and thought content  normal.  Vitals reviewed.     BP 111/67   Pulse 96   Temp 97.1 F (36.2 C) (Oral)   Ht 5' (1.524 m)   Wt 131 lb (59.4 kg)   BMI 25.58 kg/m      Assessment & Plan:  1. Acute bilateral low back pain with left-sided sciatica Rest -Ice and heat as needed -ROM and stretches discussed- handout given -No other NSAID's while taking naprosyn Sedation precaution discussed with flexeril RTO prn or if symptoms do not improve or worsen - Urinalysis, Complete - methylPREDNISolone acetate (DEPO-MEDROL) injection 80 mg; Inject 1 mL (80 mg total) into the muscle once. - ketorolac (TORADOL) injection 60 mg; Inject 2 mLs (60 mg total) into the muscle once. - naproxen (NAPROSYN) 500 MG tablet; Take 1 tablet (500 mg total) by mouth 2 (two) times daily with a meal.  Dispense: 60 tablet; Refill: 1 - cyclobenzaprine (FLEXERIL) 5 MG tablet; Take 1 tablet (5 mg total) by mouth 3 (three) times daily as needed for muscle spasms.  Dispense: 30 tablet; Refill: 3  Jannifer Rodneyhristy Tove Wideman, FNP

## 2016-05-20 NOTE — Patient Instructions (Signed)
Spondylolysis Rehab Ask your health care provider which exercises are safe for you. Do exercises exactly as told by your health care provider and adjust them as directed. It is normal to feel mild stretching, pulling, tightness, or discomfort as you do these exercises, but you should stop right away if you feel sudden pain or your pain gets worse. Do not begin these exercises until told by your health care provider. Stretching and range of motion exercises These exercises warm up your muscles and joints and improve the movement and flexibility of your hips and your back. These exercises may also help to relieve pain, numbness, and tingling. Exercise A: Single knee to chest   1. Lie on your back on a firm surface with both legs straight. 2. Bend one of your knees. Use your hands to move your knee up toward your chest until you feel a gentle stretch in your lower back and buttock.  Hold your leg in this position by holding onto the front of your knee.  Keep your other leg as straight as possible. 3. Hold for __________ seconds. 4. Slowly return to the starting position. 5. Repeat this exercise with your other leg. Repeat __________ times. Complete this exercise __________ times a day. Exercise B: Hamstring stretch, supine   1. Lie on your back. 2. Hold both ends of a belt or towel as you loop it over the ball of one of your feet. The ball of your foot is on the walking surface, right under your toes. 3. Straighten your knee and slowly pull on the belt to raise your leg.  Do not let your knee bend while you do this.  Keep your other leg flat on the floor.  Raise the leg until you feel a gentle stretch in the back of your knee or thigh. 4. Hold for __________ seconds. 5. If told by your health care provider, repeat this exercise with your other leg. Repeat __________ times. Complete this exercise __________ times a day. Strengthening exercises These exercises build strength and endurance  in your back. Endurance is the ability to use your muscles for a long time, even after they get tired. Exercise C: Pelvic tilt  1. Lie on your back on a firm bed or the floor. Bend your knees and keep your feet flat. 2. Tense your abdominal muscles. Tip your pelvis up toward the ceiling and flatten your lower back into the floor.  To help with this exercise, you may place a small towel under your lower back and try to push your back into the towel. 3. Hold for __________ seconds. 4. Let your muscles relax completely before you repeat this exercise. Repeat __________ times. Complete this exercise __________ times a day. Exercise D: Abdominal crunch   1. Lie on your back on a firm surface. Bend your knees and keep your feet flat. Cross your arms over your chest. 2. Tuck your chin down toward your chest, without bending your neck. 3. Use your abdominal muscles to lift your upper body off of the ground, straight up into the air.  Try to lift yourself until your shoulder blades are off the ground. You may need to work up to this.  Keep your lower back on the ground while you crunch upward.  Do not hold your breath. 4. Slowly lower yourself down. Keep your abdominal muscles tense until you are back to the starting position. Repeat __________ times. Complete this exercise __________ times a day. Exercise E: Alternating arm and leg   raises   1. Get on your hands and knees on a firm surface. If you are on a hard floor, you may want to use padding to cushion your knees, such as an exercise mat. 2. Line up your arms and legs. Your hands should be below your shoulders, and your knees should be below your hips. 3. Lift your left leg behind you. At the same time, raise your right arm and straighten it in front of you.  Do not lift your leg higher than your hip.  Do not lift your arm higher than your shoulder.  Keep your abdominal and back muscles tight.  Keep your hips facing the ground.  Do not  arch your back.  Keep your balance carefully, and do not hold your breath. 4. Hold for __________ seconds. 5. Slowly return to the starting position and repeat with your right leg and your left arm. Repeat __________ times. Complete this exercise __________ times a day. Posture and body mechanics Body mechanics refers to the movements and positions of your body while you do your daily activities. Posture is part of body mechanics. Good posture and healthy body mechanics can help to relieve stress in your body's tissues and joints. Good posture means that your spine is in its natural S-curve position (your spine is neutral), your shoulders are pulled back slightly, and your head is not tipped forward. The following are general guidelines for applying improved posture and body mechanics to your everyday activities. Standing    When standing, keep your spine neutral and your feet about hip-width apart. Keep a slight bend in your knees. Your ears, shoulders, and hips should line up with each other.  When you do a task in which you stand in one place for a long time, place one foot up on a stable object that is 2-4 inches (5-10 cm) high, such as a footstool. This helps keep your spine neutral. Sitting    When sitting, keep your spine neutral and keep your feet flat on the floor. Use a footrest, if necessary, and keep your thighs parallel to the floor. Avoid rounding your shoulders, and avoid tilting your head forward.  When working at a desk or a computer, keep your desk at a height where your hands are slightly lower than your elbows. Slide your chair under your desk so you are close enough to maintain good posture.  When working at a computer, place your monitor at a height where you are looking straight ahead and you do not have to tilt your head forward or downward to look at the screen. Resting   When lying down and resting, avoid positions that are most painful for you.  If you have pain  with activities such as sitting, bending, stooping, or squatting (flexion-based activities), lie in a position in which your body does not bend very much. For example, avoid curling up on your side with your arms and knees near your chest (fetal position).  If you have pain with activities such as standing for a long time or reaching with your arms (extension-based activities), lie with your spine in a neutral position and bend your knees slightly. Try the following positions:  Lying on your side with a pillow between your knees.  Lying on your back with a pillow under your knees. Lifting    When lifting objects, keep your feet at least shoulder-width apart and tighten your abdominal muscles.  Bend your knees and hips and keep your spine neutral. It   is important to lift using the strength of your legs, not your back. Do not lock your knees straight out.  Always ask for help to lift heavy or awkward objects. This information is not intended to replace advice given to you by your health care provider. Make sure you discuss any questions you have with your health care provider. Document Released: 05/09/2005 Document Revised: 01/14/2016 Document Reviewed: 02/17/2015 Elsevier Interactive Patient Education  2017 [employer]  

## 2016-07-10 ENCOUNTER — Other Ambulatory Visit: Payer: Self-pay | Admitting: Family Medicine

## 2016-07-10 DIAGNOSIS — K219 Gastro-esophageal reflux disease without esophagitis: Secondary | ICD-10-CM

## 2016-07-15 ENCOUNTER — Ambulatory Visit (INDEPENDENT_AMBULATORY_CARE_PROVIDER_SITE_OTHER): Payer: BLUE CROSS/BLUE SHIELD | Admitting: Family Medicine

## 2016-07-15 ENCOUNTER — Encounter: Payer: Self-pay | Admitting: Family Medicine

## 2016-07-15 ENCOUNTER — Ambulatory Visit (INDEPENDENT_AMBULATORY_CARE_PROVIDER_SITE_OTHER): Payer: BLUE CROSS/BLUE SHIELD

## 2016-07-15 VITALS — BP 98/62 | HR 83 | Temp 98.4°F | Ht 60.0 in | Wt 129.0 lb

## 2016-07-15 DIAGNOSIS — K279 Peptic ulcer, site unspecified, unspecified as acute or chronic, without hemorrhage or perforation: Secondary | ICD-10-CM

## 2016-07-15 DIAGNOSIS — M542 Cervicalgia: Secondary | ICD-10-CM

## 2016-07-15 DIAGNOSIS — R1012 Left upper quadrant pain: Secondary | ICD-10-CM

## 2016-07-15 DIAGNOSIS — M792 Neuralgia and neuritis, unspecified: Secondary | ICD-10-CM

## 2016-07-15 DIAGNOSIS — M25511 Pain in right shoulder: Secondary | ICD-10-CM

## 2016-07-15 LAB — MICROSCOPIC EXAMINATION
BACTERIA UA: NONE SEEN
RENAL EPITHEL UA: NONE SEEN /HPF

## 2016-07-15 LAB — URINALYSIS, COMPLETE
BILIRUBIN UA: NEGATIVE
Glucose, UA: NEGATIVE
Ketones, UA: NEGATIVE
LEUKOCYTES UA: NEGATIVE
Nitrite, UA: NEGATIVE
PH UA: 7.5 (ref 5.0–7.5)
Protein, UA: NEGATIVE
Specific Gravity, UA: 1.02 (ref 1.005–1.030)
Urobilinogen, Ur: 1 mg/dL (ref 0.2–1.0)

## 2016-07-15 MED ORDER — PANTOPRAZOLE SODIUM 40 MG PO TBEC: 40.0000 mg | DELAYED_RELEASE_TABLET | Freq: Every day | ORAL | 11 refills | Status: DC

## 2016-07-15 MED ORDER — PREDNISONE 10 MG PO TABS: ORAL_TABLET | ORAL | 0 refills | Status: DC

## 2016-07-15 NOTE — Progress Notes (Signed)
Subjective:  Patient ID: Catherine Haney, female    DOB: 02/03/1991  Age: 26 y.o. MRN: [id number]  CC: Back Pain (pt here today c/o back pain, neck pain and abdominal discomfort.)   HPI Catherine Haney presents for pain radiating from the right upper chest to the posterior neck. Occurs intermittently. Pt. Has young children that she carries with RUE at times. There is NKI. Pain is moderate. 6-7/10   History Catherine Haney has a past medical history of GERD (gastroesophageal reflux disease).   She has no past surgical history on file.   Her family history includes Diabetes in her father; Hypertension in her mother.She reports that she has never smoked. She has never used smokeless tobacco. She reports that she does not drink alcohol or use drugs.    ROS Review of Systems  Constitutional: Negative for activity change, appetite change and fever.  HENT: Negative for congestion, rhinorrhea and sore throat.   Eyes: Negative for visual disturbance.  Respiratory: Negative for cough and shortness of breath.   Cardiovascular: Negative for chest pain and palpitations.  Gastrointestinal: Positive for abdominal pain (LUQ, Moderate. sharp. intermittent. Onset a few days ago). Negative for diarrhea and nausea.  Genitourinary: Negative for dysuria.  Musculoskeletal: Positive for arthralgias. Negative for myalgias.    Objective:  BP 98/62   Pulse 83   Temp 98.4 F (36.9 C) (Oral)   Ht 5' (1.524 m)   Wt 129 lb (58.5 kg)   BMI 25.19 kg/m   BP Readings from Last 3 Encounters:  07/15/16 98/62  05/20/16 111/67  04/28/16 112/74    Wt Readings from Last 3 Encounters:  07/15/16 129 lb (58.5 kg)  05/20/16 131 lb (59.4 kg)  04/28/16 125 lb 6.4 oz (56.9 kg)     Physical Exam  Constitutional: She is oriented to person, place, and time. She appears well-developed and well-nourished. No distress.  HENT:  Head: Normocephalic and atraumatic.  Eyes: Conjunctivae and EOM are normal. Pupils are equal,  round, and reactive to light.  Neck: Normal range of motion. Neck supple.  Cardiovascular: Normal rate, regular rhythm and normal heart sounds.   No murmur heard. Pulmonary/Chest: Effort normal and breath sounds normal. No respiratory distress. She has no wheezes. She has no rales.  Abdominal: Soft. Bowel sounds are normal. She exhibits no distension. There is no tenderness.  Musculoskeletal: She exhibits tenderness. She exhibits no edema.       Right shoulder: She exhibits decreased range of motion and tenderness.       Cervical back: She exhibits tenderness and spasm.       Lumbar back: She exhibits normal range of motion, no tenderness, no deformity, no spasm and normal pulse.  Neurological: She is alert and oriented to person, place, and time. She has normal reflexes.  Skin: Skin is warm and dry.  Psychiatric: She has a normal mood and affect. Her behavior is normal. Thought content normal.      Assessment & Plan:   Catherine Haney was seen today for back pain.  Diagnoses and all orders for this visit:  Cervicalgia -     DG Cervical Spine Complete; Future -     predniSONE (DELTASONE) 10 MG tablet; Take 5 daily for 3 days followed by 4,3,2 and 1 for 3 days each.  Acute pain of right shoulder -     DG Shoulder Right; Future -     predniSONE (DELTASONE) 10 MG tablet; Take 5 daily for 3 days followed by 4,3,2 and 1  for 3 days each.  LUQ pain -     Amylase -     CBC with Differential/Platelet -     CMP14+EGFR -     DG Abd 2 Views; Future -     Urinalysis, Complete -     Lipase -     Microscopic Examination  Neuralgia -     predniSONE (DELTASONE) 10 MG tablet; Take 5 daily for 3 days followed by 4,3,2 and 1 for 3 days each.  Peptic ulcer -     pantoprazole (PROTONIX) 40 MG tablet; Take 1 tablet (40 mg total) by mouth daily. For stomach      I have discontinued Catherine Haney's fluticasone, naproxen, and cyclobenzaprine. I am also having her start on pantoprazole and predniSONE.  Additionally, I am having her maintain her omeprazole.  Allergies as of 07/15/2016   No Known Allergies     Medication List       Accurate as of 07/15/16 11:59 PM. Always use your most recent med list.          omeprazole 20 MG capsule Commonly known as:  PRILOSEC Take 1 capsule (20 mg total) by mouth 2 (two) times daily before a meal.   pantoprazole 40 MG tablet Commonly known as:  PROTONIX Take 1 tablet (40 mg total) by mouth daily. For stomach   predniSONE 10 MG tablet Commonly known as:  DELTASONE Take 5 daily for 3 days followed by 4,3,2 and 1 for 3 days each.        Follow-up: Return in about 2 weeks (around 07/29/2016).  Claretta Fraise, M.D.

## 2016-07-15 NOTE — Patient Instructions (Signed)
Use Metamucil 1 tablespoon in water daily to help with gas and stool.

## 2016-07-16 LAB — CBC WITH DIFFERENTIAL/PLATELET
BASOS ABS: 0 10*3/uL (ref 0.0–0.2)
Basos: 0 %
EOS (ABSOLUTE): 0.1 10*3/uL (ref 0.0–0.4)
Eos: 1 %
Hematocrit: 37.4 % (ref 34.0–46.6)
Hemoglobin: 12.7 g/dL (ref 11.1–15.9)
IMMATURE GRANS (ABS): 0 10*3/uL (ref 0.0–0.1)
IMMATURE GRANULOCYTES: 0 %
LYMPHS: 42 %
Lymphocytes Absolute: 2.8 10*3/uL (ref 0.7–3.1)
MCH: 28.2 pg (ref 26.6–33.0)
MCHC: 34 g/dL (ref 31.5–35.7)
MCV: 83 fL (ref 79–97)
MONOCYTES: 7 %
Monocytes Absolute: 0.4 10*3/uL (ref 0.1–0.9)
NEUTROS PCT: 50 %
Neutrophils Absolute: 3.4 10*3/uL (ref 1.4–7.0)
PLATELETS: 390 10*3/uL — AB (ref 150–379)
RBC: 4.5 x10E6/uL (ref 3.77–5.28)
RDW: 13.7 % (ref 12.3–15.4)
WBC: 6.7 10*3/uL (ref 3.4–10.8)

## 2016-07-16 LAB — CMP14+EGFR
ALT: 17 IU/L (ref 0–32)
AST: 19 IU/L (ref 0–40)
Albumin/Globulin Ratio: 1.3 (ref 1.2–2.2)
Albumin: 4.7 g/dL (ref 3.5–5.5)
Alkaline Phosphatase: 87 IU/L (ref 39–117)
BUN/Creatinine Ratio: 25 — ABNORMAL HIGH (ref 9–23)
BUN: 13 mg/dL (ref 6–20)
Bilirubin Total: 0.2 mg/dL (ref 0.0–1.2)
CALCIUM: 9.5 mg/dL (ref 8.7–10.2)
CHLORIDE: 97 mmol/L (ref 96–106)
CO2: 22 mmol/L (ref 18–29)
CREATININE: 0.53 mg/dL — AB (ref 0.57–1.00)
GFR, EST AFRICAN AMERICAN: 152 mL/min/{1.73_m2} (ref >59)
GFR, EST NON AFRICAN AMERICAN: 131 mL/min/{1.73_m2} (ref >59)
GLUCOSE: 104 mg/dL — AB (ref 65–99)
Globulin, Total: 3.5 g/dL (ref 1.5–4.5)
Potassium: 4.2 mmol/L (ref 3.5–5.2)
Sodium: 139 mmol/L (ref 134–144)
TOTAL PROTEIN: 8.2 g/dL (ref 6.0–8.5)

## 2016-07-16 LAB — LIPASE: Lipase: 26 U/L (ref 14–72)

## 2016-07-16 LAB — AMYLASE: AMYLASE: 67 U/L (ref 31–124)

## 2016-08-24 ENCOUNTER — Ambulatory Visit (INDEPENDENT_AMBULATORY_CARE_PROVIDER_SITE_OTHER): Payer: BLUE CROSS/BLUE SHIELD | Admitting: Family Medicine

## 2016-08-24 ENCOUNTER — Encounter: Payer: Self-pay | Admitting: Family Medicine

## 2016-08-24 ENCOUNTER — Ambulatory Visit (INDEPENDENT_AMBULATORY_CARE_PROVIDER_SITE_OTHER): Payer: BLUE CROSS/BLUE SHIELD

## 2016-08-24 VITALS — BP 105/66 | HR 91 | Temp 98.3°F | Ht 60.0 in | Wt 132.0 lb

## 2016-08-24 DIAGNOSIS — M5432 Sciatica, left side: Secondary | ICD-10-CM

## 2016-08-24 DIAGNOSIS — M542 Cervicalgia: Secondary | ICD-10-CM

## 2016-08-24 DIAGNOSIS — M792 Neuralgia and neuritis, unspecified: Secondary | ICD-10-CM

## 2016-08-24 DIAGNOSIS — K219 Gastro-esophageal reflux disease without esophagitis: Secondary | ICD-10-CM

## 2016-08-24 DIAGNOSIS — R1032 Left lower quadrant pain: Secondary | ICD-10-CM

## 2016-08-24 MED ORDER — DICLOFENAC SODIUM 75 MG PO TBEC: 75.0000 mg | DELAYED_RELEASE_TABLET | Freq: Two times a day (BID) | ORAL | 2 refills | Status: DC

## 2016-08-24 MED ORDER — OMEPRAZOLE 40 MG PO CPDR: 40.0000 mg | DELAYED_RELEASE_CAPSULE | Freq: Every day | ORAL | 5 refills | Status: DC

## 2016-08-24 NOTE — Progress Notes (Signed)
Subjective:  Patient ID: Catherine Haney, female    DOB: February 20, 1991  Age: 26 y.o. MRN: [id number]  CC: Neck Pain (pt here today c/o neck pain x 10 days)  Translator "skype" for arabic. HPI Catherine Haney presents for recurrent pain in posterior neck and to the right shoulder. Pt. States pain is 10/10. Feels like an ache with some sharpness to the radiation to RUE proximally. Can not move her neck - rotation, flexion. Started back after finishing the prednisone. Although prednisone gave significant relief.   Pt. Also has moderate pain at the left posterior thigh and lower back. Intermittent but worsening, again since the prednisone was finished.  History Catherine Haney has a past medical history of GERD (gastroesophageal reflux disease).   She has no past surgical history on file.   Her family history includes Diabetes in her father; Hypertension in her mother.She reports that she has never smoked. She has never used smokeless tobacco. She reports that she does not drink alcohol or use drugs.  No current outpatient prescriptions on file prior to visit.   No current facility-administered medications on file prior to visit.     ROS Review of Systems  Constitutional: Negative for activity change, appetite change and fever.  HENT: Negative for congestion, rhinorrhea and sore throat.   Eyes: Negative for visual disturbance.  Respiratory: Negative for cough and shortness of breath.   Cardiovascular: Negative for chest pain and palpitations.  Gastrointestinal: Negative for abdominal pain, diarrhea and nausea.  Genitourinary: Negative for dysuria.  Musculoskeletal: Positive for arthralgias and myalgias.    Objective:  BP 105/66   Pulse 91   Temp 98.3 F (36.8 C) (Oral)   Ht 5' (1.524 m)   Wt 132 lb (59.9 kg)   BMI 25.78 kg/m   Physical Exam  Constitutional: She is oriented to person, place, and time. She appears well-developed and well-nourished. No distress.  HENT:  Head: Normocephalic  and atraumatic.  Eyes: Conjunctivae are normal. Pupils are equal, round, and reactive to light.  Neck: Normal range of motion. Neck supple. No thyromegaly present.  Cardiovascular: Normal rate, regular rhythm and normal heart sounds.   No murmur heard. Pulmonary/Chest: Effort normal and breath sounds normal. No respiratory distress. She has no wheezes. She has no rales.  Abdominal: Soft. Bowel sounds are normal. She exhibits no distension. There is no tenderness.  Musculoskeletal: Normal range of motion. She exhibits tenderness (at posterior neck, superior aspect of trapezius and into deltoid.).  Lymphadenopathy:    She has no cervical adenopathy.  Neurological: She is alert and oriented to person, place, and time.  Skin: Skin is warm and dry.  Psychiatric: She has a normal mood and affect. Her behavior is normal. Judgment and thought content normal.    Assessment & Plan:   Catherine Haney was seen today for neck pain.  Diagnoses and all orders for this visit:  Neuralgia  Sciatica of left side -     DG Lumbar Spine 2-3 Views; Future -     DG HIP UNILAT W OR W/O PELVIS 2-3 VIEWS LEFT; Future  Cervicalgia  Gastroesophageal reflux disease, esophagitis presence not specified -     omeprazole (PRILOSEC) 40 MG capsule; Take 1 capsule (40 mg total) by mouth daily. On an empty stomach  Left lower quadrant pain -     omeprazole (PRILOSEC) 40 MG capsule; Take 1 capsule (40 mg total) by mouth daily. On an empty stomach  Other orders -     diclofenac (VOLTAREN) 75  MG EC tablet; Take 1 tablet (75 mg total) by mouth 2 (two) times daily. For muscle and  Joint pain   I have discontinued [name]'s pantoprazole and predniSONE. I have also changed her omeprazole. Additionally, I am having her start on diclofenac.  Meds ordered this encounter  Medications  . diclofenac (VOLTAREN) 75 MG EC tablet    Sig: Take 1 tablet (75 mg total) by mouth 2 (two) times daily. For muscle and  Joint pain     Dispense:  60 tablet    Refill:  2  . omeprazole (PRILOSEC) 40 MG capsule    Sig: Take 1 capsule (40 mg total) by mouth daily. On an empty stomach    Dispense:  30 capsule    Refill:  5     Follow-up: Return in about 6 weeks (around 10/05/2016).  Mechele Claude, M.D.

## 2016-10-13 ENCOUNTER — Ambulatory Visit: Payer: BLUE CROSS/BLUE SHIELD | Admitting: Family

## 2016-10-14 ENCOUNTER — Ambulatory Visit (INDEPENDENT_AMBULATORY_CARE_PROVIDER_SITE_OTHER): Payer: BLUE CROSS/BLUE SHIELD | Admitting: Pediatrics

## 2016-10-14 ENCOUNTER — Encounter: Payer: Self-pay | Admitting: Pediatrics

## 2016-10-14 VITALS — BP 103/70 | HR 81 | Temp 97.6°F | Ht 60.0 in | Wt 127.6 lb

## 2016-10-14 DIAGNOSIS — M542 Cervicalgia: Secondary | ICD-10-CM

## 2016-10-14 DIAGNOSIS — L298 Other pruritus: Secondary | ICD-10-CM

## 2016-10-14 DIAGNOSIS — N941 Unspecified dyspareunia: Secondary | ICD-10-CM

## 2016-10-14 DIAGNOSIS — N898 Other specified noninflammatory disorders of vagina: Secondary | ICD-10-CM

## 2016-10-14 LAB — MICROSCOPIC EXAMINATION: Renal Epithel, UA: NONE SEEN /hpf

## 2016-10-14 LAB — URINALYSIS, COMPLETE
BILIRUBIN UA: NEGATIVE
GLUCOSE, UA: NEGATIVE
KETONES UA: NEGATIVE
Leukocytes, UA: NEGATIVE
NITRITE UA: NEGATIVE
Protein, UA: NEGATIVE
SPEC GRAV UA: 1.025 (ref 1.005–1.030)
UUROB: 1 mg/dL (ref 0.2–1.0)
pH, UA: 6 (ref 5.0–7.5)

## 2016-10-14 LAB — WET PREP FOR TRICH, YEAST, CLUE
Clue Cell Exam: NEGATIVE
Trichomonas Exam: NEGATIVE
Yeast Exam: NEGATIVE

## 2016-10-14 MED ORDER — NAPROXEN 500 MG PO TABS: 500.0000 mg | ORAL_TABLET | Freq: Two times a day (BID) | ORAL | 0 refills | Status: DC

## 2016-10-14 MED ORDER — CYCLOBENZAPRINE HCL 5 MG PO TABS: 5.0000 mg | ORAL_TABLET | Freq: Two times a day (BID) | ORAL | 1 refills | Status: DC | PRN

## 2016-10-14 NOTE — Progress Notes (Signed)
Subjective:   Patient ID: Catherine Sparkunia Haney, female    DOB: [birth year], 26 y.o.   MRN: [id number] CC: Neck Pain (intermittent, 0-10 (10)) and Abdominal Pain (intermittent (8))  HPI: Catherine Haney is a 26 y.o. female presenting for Neck Pain (intermittent, 0-10 (10)) and Abdominal Pain (intermittent (8))  Computer interpreter used throughout visit  i'm having pain in the neck from front of shoulder/R upper chest down to lower abdomen Neck pain ongoing 2 months  Given medication, helps, then pain comes back Sleeping on the R side makes the pain worse Comes and goes throughout the day Sitting still now has no pain Had pain yesterday Was tried on prednisone, she says it did help with pain but caused her to gain weight  Pain comes on with house chores, working at home No pain when resting  Pain in lower abd comes with mentruation Lasted for three weeks Became more of vaginal area pain recently Pain there every day now Sometimes all day, other times just part of day Nothing she can thinks of can make pain better Intercourse makes pain worse, has had some pain with intercourse off and on for 4 months Have the pain already, then pain is worse with intercourse No fevers Normal appetite  Relevant past medical, surgical, family and social history reviewed. Allergies and medications reviewed and updated. History  Smoking Status  . Never Smoker  Smokeless Tobacco  . Never Used   ROS: Per HPI   Objective:    BP 103/70   Pulse 81   Temp 97.6 F (36.4 C) (Oral)   Ht 5' (1.524 m)   Wt 127 lb 9.6 oz (57.9 kg)   BMI 24.92 kg/m   Wt Readings from Last 3 Encounters:  10/14/16 127 lb 9.6 oz (57.9 kg)  08/24/16 132 lb (59.9 kg)  07/15/16 129 lb (58.5 kg)    Gen: NAD, alert, cooperative with exam, NCAT EYES: EOMI, no conjunctival injection, or no icterus ENT: OP without erythema LYMPH: no cervical LAD CV: NRRR, normal S1/S2, no murmur, distal pulses 2+ b/l Resp: CTABL, no wheezes,  normal WOB Abd: +BS, soft, mildly tender lower quadrants, ND. no guarding or organomegaly Ext: No edema, warm Neuro: Alert and oriented MSK: ttp soft tissues over R shoulder, pectoris R shoulder Full ROM though pain with active ROM, minimal pain with passive ROM Tender over biceps tendon groove R Rotator cuff muscles intact b/l R side GU: normal external female genitalia, cervix slightly friable, scant small amount mucus present, +CTM and L adenxal tenderness, no passes palpated  Assessment & Plan:  Catherine Haney was seen today for neck pain and abdominal pain.  Diagnoses and all orders for this visit:  Neck pain Muscle strain related Naproxen BID, take flexeril as needed at night. Discussed with interpreter do not drive on medicine -     naproxen (NAPROSYN) 500 MG tablet; Take 1 tablet (500 mg total) by mouth 2 (two) times daily with a meal. -     cyclobenzaprine (FLEXERIL) 5 MG tablet; Take 1 tablet (5 mg total) by mouth 2 (two) times daily as needed for muscle spasms. Do not drive, can cause sleepiness  Vaginal itching Test for below -     WET PREP FOR TRICH, YEAST, CLUE -     GC/Chlamydia Probe Amp  Dyspareunia in female +CTM, no fevers Will test below Refer to gyn if not improving -     WET PREP FOR TRICH, YEAST, CLUE -     GC/Chlamydia Probe Amp -  Urinalysis, Complete -     Urine culture   Follow up plan: Return in about 2 weeks (around 10/28/2016). Rex Kras, MD [facility]

## 2016-10-15 LAB — GC/CHLAMYDIA PROBE AMP
CHLAMYDIA, DNA PROBE: NEGATIVE
Neisseria gonorrhoeae by PCR: NEGATIVE

## 2016-10-16 LAB — URINE CULTURE: Organism ID, Bacteria: NO GROWTH

## 2016-10-20 ENCOUNTER — Other Ambulatory Visit: Payer: Self-pay | Admitting: Family Medicine

## 2016-10-20 DIAGNOSIS — M542 Cervicalgia: Secondary | ICD-10-CM

## 2016-10-20 DIAGNOSIS — M792 Neuralgia and neuritis, unspecified: Secondary | ICD-10-CM

## 2016-10-20 DIAGNOSIS — M25511 Pain in right shoulder: Secondary | ICD-10-CM

## 2016-12-15 ENCOUNTER — Telehealth: Payer: Self-pay | Admitting: Family Medicine

## 2016-12-22 ENCOUNTER — Ambulatory Visit: Payer: BLUE CROSS/BLUE SHIELD | Admitting: Family

## 2016-12-22 NOTE — Telephone Encounter (Signed)
Patient was at office with children today and no complaints of sickness.

## 2016-12-23 ENCOUNTER — Encounter: Payer: Self-pay | Admitting: Pediatrics

## 2016-12-23 ENCOUNTER — Ambulatory Visit (INDEPENDENT_AMBULATORY_CARE_PROVIDER_SITE_OTHER): Payer: BLUE CROSS/BLUE SHIELD | Admitting: Pediatrics

## 2016-12-23 VITALS — BP 101/71 | HR 71 | Temp 97.8°F | Ht 60.0 in | Wt 123.4 lb

## 2016-12-23 DIAGNOSIS — R109 Unspecified abdominal pain: Secondary | ICD-10-CM

## 2016-12-23 DIAGNOSIS — L659 Nonscarring hair loss, unspecified: Secondary | ICD-10-CM

## 2016-12-23 DIAGNOSIS — N898 Other specified noninflammatory disorders of vagina: Secondary | ICD-10-CM

## 2016-12-23 DIAGNOSIS — R102 Pelvic and perineal pain: Secondary | ICD-10-CM

## 2016-12-23 LAB — WET PREP FOR TRICH, YEAST, CLUE
Clue Cell Exam: NEGATIVE
TRICHOMONAS EXAM: NEGATIVE
Yeast Exam: NEGATIVE

## 2016-12-23 NOTE — Progress Notes (Signed)
  Subjective:   Patient ID: Catherine Haney, female    DOB: 01/16/1991, 26 y.o.   MRN: [id number] CC: Abdominal Pain and vaginal discharge  HPI: Catherine SparkDunia Haney is a 26 y.o. female presenting for Abdominal Pain and vaginal discharge  Video interpreter used throughout visit  Pain in lower abd Seen for similar 2 mo ago Normal labs at that time including sti screen Says did not improve Has had some vaginal discharge and itching which is new  Happening for a couple of months off and on Discharge without smell, has been white, thick No dysuria  abd pain comes and goes as well Vaginal itching bothering her the most +dyspareunia Had a fever last week when in [place], was given medicine for cold symptoms and a cough No other fevers  Appetite has been fine Mood is fine Energy levels are fine Has noticed hair thinning and hair loss over last few months  Has IUD  Relevant past medical, surgical, family and social history reviewed. Allergies and medications reviewed and updated. History  Smoking Status  . Never Smoker  Smokeless Tobacco  . Never Used   ROS: Per HPI   Objective:    BP 101/71   Pulse 71   Temp 97.8 F (36.6 C) (Oral)   Ht 5' (1.524 m)   Wt 123 lb 6.4 oz (56 kg)   BMI 24.10 kg/m   Wt Readings from Last 3 Encounters:  12/23/16 123 lb 6.4 oz (56 kg)  10/14/16 127 lb 9.6 oz (57.9 kg)  08/24/16 132 lb (59.9 kg)    Gen: NAD, alert, cooperative with exam, NCAT EYES: EOMI, no conjunctival injection, or no icterus CV: NRRR, normal S1/S2, no murmur, distal pulses 2+ b/l Resp: CTABL, no wheezes, normal WOB Abd: +BS, soft, mildly tender lower abd with palpation. no guarding or organomegaly Neuro: Alert and oriented GU: nl ext genitalia, small amount white discharge in vaginal vault, +CTM Skin: no patches of hair loss, no rash in scalp  Chaperone present throughout exam  Assessment & Plan:  Henrine ScrewsDunia was seen today for abdominal pain and vaginal discharge.  Diagnoses and  all orders for this visit:  Vaginal discharge Refer to gyn if labs normal -     WET PREP FOR TRICH, YEAST, CLUE -     Urinalysis, Complete -     GC/Chlamydia Probe Amp  Pelvic pain  Hair loss -     TSH -     CBC with Differential/Platelet  Abdominal pain, unspecified abdominal location Labs as above Return precautions discussed  Follow up plan: As needed Rex Krasarol Kelani Robart, MD [facility]

## 2016-12-24 LAB — CBC WITH DIFFERENTIAL/PLATELET
BASOS ABS: 0 10*3/uL (ref 0.0–0.2)
BASOS: 0 %
EOS (ABSOLUTE): 0.1 10*3/uL (ref 0.0–0.4)
Eos: 1 %
Hematocrit: 37.2 % (ref 34.0–46.6)
Hemoglobin: 12.4 g/dL (ref 11.1–15.9)
IMMATURE GRANS (ABS): 0.1 10*3/uL (ref 0.0–0.1)
IMMATURE GRANULOCYTES: 1 %
LYMPHS: 45 %
Lymphocytes Absolute: 3.4 10*3/uL — ABNORMAL HIGH (ref 0.7–3.1)
MCH: 28.6 pg (ref 26.6–33.0)
MCHC: 33.3 g/dL (ref 31.5–35.7)
MCV: 86 fL (ref 79–97)
MONOS ABS: 0.6 10*3/uL (ref 0.1–0.9)
Monocytes: 8 %
NEUTROS PCT: 45 %
Neutrophils Absolute: 3.5 10*3/uL (ref 1.4–7.0)
PLATELETS: 479 10*3/uL — AB (ref 150–379)
RBC: 4.34 x10E6/uL (ref 3.77–5.28)
RDW: 13.1 % (ref 12.3–15.4)
WBC: 7.7 10*3/uL (ref 3.4–10.8)

## 2016-12-24 LAB — TSH: TSH: 2.39 u[IU]/mL (ref 0.450–4.500)

## 2016-12-26 LAB — GC/CHLAMYDIA PROBE AMP
CHLAMYDIA, DNA PROBE: NEGATIVE
Neisseria gonorrhoeae by PCR: NEGATIVE

## 2017-01-02 NOTE — Addendum Note (Signed)
Addended by: Johna SheriffVINCENT, Liddy Deam L on: 01/02/2017 08:05 AM   Modules accepted: Orders

## 2017-01-10 ENCOUNTER — Ambulatory Visit: Payer: BLUE CROSS/BLUE SHIELD | Admitting: Family Medicine

## 2017-01-16 ENCOUNTER — Encounter: Payer: Self-pay | Admitting: Family Medicine

## 2017-01-16 ENCOUNTER — Ambulatory Visit (INDEPENDENT_AMBULATORY_CARE_PROVIDER_SITE_OTHER): Payer: BLUE CROSS/BLUE SHIELD | Admitting: Family Medicine

## 2017-01-16 VITALS — BP 106/69 | HR 84 | Temp 97.9°F | Ht 60.0 in | Wt 123.5 lb

## 2017-01-16 DIAGNOSIS — Z524 Kidney donor: Secondary | ICD-10-CM

## 2017-01-16 DIAGNOSIS — Z01818 Encounter for other preprocedural examination: Secondary | ICD-10-CM

## 2017-01-16 LAB — URINALYSIS, COMPLETE
Bilirubin, UA: NEGATIVE
Glucose, UA: NEGATIVE
Leukocytes, UA: NEGATIVE
Nitrite, UA: NEGATIVE
PH UA: 5 (ref 5.0–7.5)
PROTEIN UA: NEGATIVE
Specific Gravity, UA: >1.03 — ABNORMAL HIGH (ref 1.005–1.030)
UUROB: 0.2 mg/dL (ref 0.2–1.0)

## 2017-01-16 LAB — MICROSCOPIC EXAMINATION
BACTERIA UA: NONE SEEN
Renal Epithel, UA: NONE SEEN /hpf

## 2017-01-16 NOTE — Progress Notes (Signed)
BP 106/69   Pulse 84   Temp 97.9 F (36.6 C) (Oral)   Ht 5' (1.524 m)   Wt 123 lb 8 oz (56 kg)   BMI 24.12 kg/m    Subjective:    Patient ID: Catherine Haney, female    DOB: 1990-12-12, 26 y.o.   MRN: [id number]  HPI: Catherine Haney is a 26 y.o. female presenting on 01/16/2017 for Labwork (wants to donate kidney to mother who has renal failure, would like to check kidney; interpreter Hind [phone])   HPI Blood work to be a kidney donor Patient is coming in today because she needs blood work to be a kidney donor. She had some previous renal issues on testing that turned out to resolved and did not pan out anything. Her mother is going on dialysis and needs a kidney donor and she wants to go do that but she wants to make sure her kidneys are good before she makes a trip to [place] and stays there for some time without the kids. She denies any urinary symptoms that she knows of.  Relevant past medical, surgical, family and social history reviewed and updated as indicated. Interim medical history since our last visit reviewed. Allergies and medications reviewed and updated.  Review of Systems  Constitutional: Negative for chills and fever.  HENT: Negative for congestion, ear discharge and ear pain.   Eyes: Negative for redness and visual disturbance.  Respiratory: Negative for chest tightness and shortness of breath.   Cardiovascular: Negative for chest pain and leg swelling.  Genitourinary: Negative for difficulty urinating and dysuria.  Musculoskeletal: Negative for back pain and gait problem.  Skin: Negative for rash.  Neurological: Negative for light-headedness and headaches.  Psychiatric/Behavioral: Negative for agitation and behavioral problems.  All other systems reviewed and are negative.   Per HPI unless specifically indicated above     Objective:    BP 106/69   Pulse 84   Temp 97.9 F (36.6 C) (Oral)   Ht 5' (1.524 m)   Wt 123 lb 8 oz (56 kg)   BMI 24.12 kg/m     Wt Readings from Last 3 Encounters:  01/16/17 123 lb 8 oz (56 kg)  12/23/16 123 lb 6.4 oz (56 kg)  10/14/16 127 lb 9.6 oz (57.9 kg)    Physical Exam  Constitutional: She is oriented to person, place, and time. She appears well-developed and well-nourished. No distress.  Eyes: Conjunctivae are normal.  Cardiovascular: Normal rate, regular rhythm, normal heart sounds and intact distal pulses.   No murmur heard. Pulmonary/Chest: Effort normal and breath sounds normal. No respiratory distress. She has no wheezes.  Abdominal: Soft. Bowel sounds are normal. She exhibits no distension. There is no tenderness. There is no rebound and no guarding.  Musculoskeletal: Normal range of motion. She exhibits no edema or tenderness.  Neurological: She is alert and oriented to person, place, and time. Coordination normal.  Skin: Skin is warm and dry. No rash noted. She is not diaphoretic.  Psychiatric: She has a normal mood and affect. Her behavior is normal.  Nursing note and vitals reviewed.       Assessment & Plan:   Problem List Items Addressed This Visit    None    Visit Diagnoses    Preop general physical exam    -  Primary   Relevant Orders   CMP14+EGFR   CBC with Differential/Platelet   Protein / Creatinine Ratio, Urine   Renal donor  Relevant Orders   CMP14+EGFR   CBC with Differential/Platelet   Creatinine, Urine, 24 Hour   Creatinine Clearance, Urine, 24 Hour   Microalbumin, urine, 24 hour   Sodium, urine, 24 hour   Protein / Creatinine Ratio, Urine       Follow up plan: Return if symptoms worsen or fail to improve.  Counseling provided for all of the vaccine components Orders Placed This Encounter  Procedures  . CMP14+EGFR  . CBC with Differential/Platelet  . Creatinine, Urine, 24 Hour  . Creatinine Clearance, Urine, 24 Hour  . Microalbumin, urine, 24 hour  . Sodium, urine, 24 hour    Caryl Pina, MD [facility] 01/16/2017, 11:29  AM

## 2017-01-17 LAB — CMP14+EGFR
ALK PHOS: 83 IU/L (ref 39–117)
ALT: 13 IU/L (ref 0–32)
AST: 17 IU/L (ref 0–40)
Albumin/Globulin Ratio: 1.3 (ref 1.2–2.2)
Albumin: 4.3 g/dL (ref 3.5–5.5)
BILIRUBIN TOTAL: 0.3 mg/dL (ref 0.0–1.2)
BUN/Creatinine Ratio: 15 (ref 9–23)
BUN: 9 mg/dL (ref 6–20)
CALCIUM: 9.2 mg/dL (ref 8.7–10.2)
CHLORIDE: 102 mmol/L (ref 96–106)
CO2: 22 mmol/L (ref 20–29)
Creatinine, Ser: 0.59 mg/dL (ref 0.57–1.00)
GFR calc Af Amer: 145 mL/min/{1.73_m2} (ref >59)
GFR calc non Af Amer: 126 mL/min/{1.73_m2} (ref >59)
Globulin, Total: 3.4 g/dL (ref 1.5–4.5)
Glucose: 96 mg/dL (ref 65–99)
POTASSIUM: 4.3 mmol/L (ref 3.5–5.2)
SODIUM: 139 mmol/L (ref 134–144)
TOTAL PROTEIN: 7.7 g/dL (ref 6.0–8.5)

## 2017-01-17 LAB — CBC WITH DIFFERENTIAL/PLATELET
BASOS ABS: 0 10*3/uL (ref 0.0–0.2)
BASOS: 0 %
EOS (ABSOLUTE): 0.1 10*3/uL (ref 0.0–0.4)
Eos: 1 %
Hematocrit: 36.8 % (ref 34.0–46.6)
Hemoglobin: 12.1 g/dL (ref 11.1–15.9)
IMMATURE GRANULOCYTES: 0 %
Immature Grans (Abs): 0 10*3/uL (ref 0.0–0.1)
Lymphocytes Absolute: 2.6 10*3/uL (ref 0.7–3.1)
Lymphs: 39 %
MCH: 27.9 pg (ref 26.6–33.0)
MCHC: 32.9 g/dL (ref 31.5–35.7)
MCV: 85 fL (ref 79–97)
MONOS ABS: 0.5 10*3/uL (ref 0.1–0.9)
Monocytes: 7 %
NEUTROS PCT: 53 %
Neutrophils Absolute: 3.5 10*3/uL (ref 1.4–7.0)
PLATELETS: 377 10*3/uL (ref 150–379)
RBC: 4.33 x10E6/uL (ref 3.77–5.28)
RDW: 13.6 % (ref 12.3–15.4)
WBC: 6.6 10*3/uL (ref 3.4–10.8)

## 2017-01-17 LAB — PROTEIN / CREATININE RATIO, URINE
CREATININE, UR: 143.5 mg/dL
Protein, Ur: 13.6 mg/dL
Protein/Creat Ratio: 95 mg/g creat (ref 0–200)

## 2017-01-19 ENCOUNTER — Encounter: Payer: Self-pay | Admitting: Obstetrics and Gynecology

## 2017-01-19 ENCOUNTER — Ambulatory Visit (INDEPENDENT_AMBULATORY_CARE_PROVIDER_SITE_OTHER): Payer: BLUE CROSS/BLUE SHIELD | Admitting: Adult Health

## 2017-01-19 VITALS — BP 100/60 | HR 72 | Ht 61.5 in | Wt 124.2 lb

## 2017-01-19 DIAGNOSIS — N949 Unspecified condition associated with female genital organs and menstrual cycle: Secondary | ICD-10-CM | POA: Insufficient documentation

## 2017-01-19 DIAGNOSIS — B379 Candidiasis, unspecified: Secondary | ICD-10-CM | POA: Diagnosis not present

## 2017-01-19 DIAGNOSIS — L298 Other pruritus: Secondary | ICD-10-CM

## 2017-01-19 DIAGNOSIS — N898 Other specified noninflammatory disorders of vagina: Secondary | ICD-10-CM

## 2017-01-19 DIAGNOSIS — Z975 Presence of (intrauterine) contraceptive device: Secondary | ICD-10-CM

## 2017-01-19 LAB — POCT WET PREP (WET MOUNT)

## 2017-01-19 MED ORDER — FLUCONAZOLE 150 MG PO TABS: ORAL_TABLET | ORAL | 1 refills | Status: DC

## 2017-01-19 NOTE — Addendum Note (Signed)
Addended by: Arville CareETTINGER, JOSHUA on: 01/19/2017 01:02 PM   Modules accepted: Level of Service

## 2017-01-19 NOTE — Patient Instructions (Signed)
US in 2 weeks Take diflucan

## 2017-01-19 NOTE — Progress Notes (Signed)
Subjective:     Patient ID: Catherine Haney, female   DOB: 1990/06/15, 26 y.o.   MRN: [id number]  HPI Henrine ScrewsDunia is a 26 year old [place] female, married, G2P2, in complaining of vaginal itching.She is from [place]. She is new to this practice.  [facility]   Review of Systems +vaginal itching + vaginal discharge, 2 weeks ago Sex huts at times Denies any odor Has IUD, can't feel strings    Reviewed past medical,surgical, social and family history. Reviewed medications and allergies.     Objective:   Physical Exam BP 100/60 (BP Location: Right Arm, Patient Position: Sitting, Cuff Size: Normal)   Pulse 72   Ht 5' 1.5" (1.562 m)   Wt 124 lb 3.2 oz (56.3 kg)   BMI 23.09 kg/m PHQ 2 score 0. Skin warm and dry.Pelvic: external genitalia is normal in appearance no lesions, vagina: white discharge without odor,urethra has no lesions or masses noted, cervix:smooth,+IUD strings seen, uterus: normal size, shape and contour, mildly tender, no masses felt, adnexa: no masses or tenderness noted. Bladder is non tender and no masses felt. Wet prep: + few yeast and +WBCs. Had negative GC/CHL about 3 weeks ago. Has interpretor Nuha with her.     Assessment:     1. Yeast infection   2. Itching in the vaginal area   3. Vaginal discharge   4. IUD (intrauterine device) in place   5. Tenderness of uterus       Plan:     Rx diflucan 150 mg #2 take 1 now and 1 in 3 days with 1 refill Return in about 2 weeks for [place]YN [place]

## 2017-01-24 ENCOUNTER — Other Ambulatory Visit: Payer: BLUE CROSS/BLUE SHIELD

## 2017-01-25 ENCOUNTER — Other Ambulatory Visit: Payer: BLUE CROSS/BLUE SHIELD

## 2017-01-25 DIAGNOSIS — Z524 Kidney donor: Secondary | ICD-10-CM

## 2017-01-26 LAB — CREATININE CLEARANCE, URINE, 24 HOUR
CREAT CLEAR: 121 mL/min (ref 88–128)
CREATININE 24H UR: 868 mg/(24.h) (ref 800–1800)
Creatinine, Ser: 0.5 mg/dL — ABNORMAL LOW (ref 0.57–1.00)
Creatinine, Urine: 96.4 mg/dL
GFR calc Af Amer: 153 mL/min/{1.73_m2} (ref >59)
GFR calc non Af Amer: 133 mL/min/{1.73_m2} (ref >59)

## 2017-01-26 LAB — SODIUM, URINE, 24 HOUR
SODIUM UR: 234 mmol/L
Sodium, 24H Ur: 211 mmol/24 hr (ref 39–258)

## 2017-01-26 LAB — MICROALBUMIN, URINE, 24 HOUR
MICROALBUM., U, RANDOM: 15.4 ug/mL
MICROALBUMIN, MG/DAY: 13.9 mg/d (ref <30.0)

## 2017-01-27 ENCOUNTER — Telehealth: Payer: Self-pay | Admitting: Family Medicine

## 2017-01-27 NOTE — Telephone Encounter (Signed)
Called patient through interpretor services [phone].  Patient wanted results of urine tests.  These results have not been reviewed and released.  Patient informed that she will be contacted when these results are available.

## 2017-01-30 ENCOUNTER — Telehealth: Payer: Self-pay | Admitting: Family Medicine

## 2017-01-30 NOTE — Telephone Encounter (Signed)
Faxed

## 2017-02-06 ENCOUNTER — Ambulatory Visit (INDEPENDENT_AMBULATORY_CARE_PROVIDER_SITE_OTHER): Payer: BLUE CROSS/BLUE SHIELD

## 2017-02-06 ENCOUNTER — Encounter: Payer: Self-pay | Admitting: Adult Health

## 2017-02-06 ENCOUNTER — Ambulatory Visit (INDEPENDENT_AMBULATORY_CARE_PROVIDER_SITE_OTHER): Payer: BLUE CROSS/BLUE SHIELD | Admitting: Adult Health

## 2017-02-06 VITALS — BP 88/60 | HR 76 | Ht 60.0 in | Wt 125.0 lb

## 2017-02-06 DIAGNOSIS — N949 Unspecified condition associated with female genital organs and menstrual cycle: Secondary | ICD-10-CM | POA: Diagnosis not present

## 2017-02-06 DIAGNOSIS — Z975 Presence of (intrauterine) contraceptive device: Secondary | ICD-10-CM

## 2017-02-06 NOTE — Progress Notes (Signed)
Subjective:     Patient ID: Catherine Haney, female   DOB: 1991-02-19, 26 y.o.   MRN: [id number]  HPI Catherine Haney is a 26 year old Arab female in for [place] and for uterine tenderness, has IUD and sex hurts at times.   Review of Systems Itching has resolved Uterus tender with sex at times Denies any diarrhea or constipation. Reviewed past medical,surgical, social and family history. Reviewed medications and allergies.     Objective:   Physical Exam BP (!) 88/60 (BP Location: Left Arm, Patient Position: Sitting, Cuff Size: Small)   Pulse 76   Ht 5' (1.524 m)   Wt 125 lb (56.7 kg)   BMI 24.41 kg/m    Talk only, discussed WU:[id number] and ovaries normal, IUD in place, ECC 3.5 mm and small amount of fluid in endometrium.Could be bowel related. Change positions with sex.If discomfort continues come back. Assessment:     1. Tenderness of uterus   2. IUD (intrauterine device) in place       Plan:     Follow up prn

## 2017-02-06 NOTE — Progress Notes (Signed)
PELVIC US TA/TV: homogeneous anteverted uterus,WNL,EEC 3.5 mm,IUD is centrally located w/in the endometrium,small amount of fluid w/in the endometrium,normal ovaries bilat,no free fluid,left adnexal pain during ultrasound,ovaries appear mobile

## 2017-02-07 ENCOUNTER — Encounter: Payer: Self-pay | Admitting: Family Medicine

## 2017-02-07 ENCOUNTER — Ambulatory Visit (INDEPENDENT_AMBULATORY_CARE_PROVIDER_SITE_OTHER): Payer: BLUE CROSS/BLUE SHIELD | Admitting: Family Medicine

## 2017-02-07 VITALS — BP 110/72 | HR 79 | Temp 97.9°F | Ht 60.0 in | Wt 126.0 lb

## 2017-02-07 DIAGNOSIS — M778 Other enthesopathies, not elsewhere classified: Secondary | ICD-10-CM | POA: Diagnosis not present

## 2017-02-07 MED ORDER — PREDNISONE 20 MG PO TABS: ORAL_TABLET | ORAL | 0 refills | Status: DC

## 2017-02-07 MED ORDER — NAPROXEN 500 MG PO TABS: 500.0000 mg | ORAL_TABLET | Freq: Two times a day (BID) | ORAL | 0 refills | Status: DC

## 2017-02-07 NOTE — Progress Notes (Signed)
BP 110/72   Pulse 79   Temp 97.9 F (36.6 C) (Oral)   Ht 5' (1.524 m)   Wt 126 lb (57.2 kg)   BMI 24.61 kg/m    Subjective:    Patient ID: Catherine Haney, female    DOB: 06/06/1990, 26 y.o.   MRN: [id number]  HPI: Ricky Doan is a 26 y.o. female presenting on 02/07/2017 for Right hand pain (began about a month ago, has worsened in the last 4 days, from first digit up and into thumb; interpretor [id number] Ammar)   HPI Right hand pain and wrist pain Patient has been having right hand and wrist pain that extends from the lateral aspect of her hand and wrist including her thumb and first finger up to her elbow. She denies any redness or warmth but did have some swelling although the swelling has come down with using naproxen and the pain has come down some with using naproxen. She describes it as a sharp burning pain. She denies any fevers or chills. She denies any weakness in that hand.  Relevant past medical, surgical, family and social history reviewed and updated as indicated. Interim medical history since our last visit reviewed. Allergies and medications reviewed and updated.  Review of Systems  Constitutional: Negative for chills and fever.  Respiratory: Negative for chest tightness and shortness of breath.   Cardiovascular: Negative for chest pain and leg swelling.  Musculoskeletal: Positive for arthralgias and joint swelling. Negative for back pain and gait problem.  Skin: Negative for rash.  Neurological: Negative for light-headedness and headaches.  Psychiatric/Behavioral: Negative for agitation and behavioral problems.  All other systems reviewed and are negative.  Per HPI unless specifically indicated above     Objective:    BP 110/72   Pulse 79   Temp 97.9 F (36.6 C) (Oral)   Ht 5' (1.524 m)   Wt 126 lb (57.2 kg)   BMI 24.61 kg/m   Wt Readings from Last 3 Encounters:  02/07/17 126 lb (57.2 kg)  02/06/17 125 lb (56.7 kg)  01/19/17 124 lb 3.2 oz (56.3 kg)    Physical Exam  Constitutional: She is oriented to person, place, and time. She appears well-developed and well-nourished. No distress.  Eyes: Conjunctivae are normal.  Cardiovascular: Normal rate, regular rhythm, normal heart sounds and intact distal pulses.   No murmur heard. Pulmonary/Chest: Effort normal and breath sounds normal. No respiratory distress. She has no wheezes.  Musculoskeletal: Normal range of motion. She exhibits no edema.       Right wrist: She exhibits tenderness (Pain extending along the anterior and posterior aspect of the lateral wrist and base of thumb up into lateral epicondylitis). She exhibits normal range of motion, no bony tenderness, no swelling and no deformity.  Neurological: She is alert and oriented to person, place, and time. Coordination normal.  Skin: Skin is warm and dry. No rash noted. She is not diaphoretic.  Psychiatric: She has a normal mood and affect. Her behavior is normal.  Nursing note and vitals reviewed.     Assessment & Plan:   Problem List Items Addressed This Visit    None    Visit Diagnoses    Right wrist tendinitis    -  Primary   Relevant Medications   predniSONE (DELTASONE) 20 MG tablet   naproxen (NAPROSYN) 500 MG tablet       Follow up plan: Return if symptoms worsen or fail to improve.  Counseling provided for all of the  vaccine components No orders of the defined types were placed in this encounter.   Arville Care, MD [facility] 02/07/2017, 11:34 AM

## 2017-02-13 ENCOUNTER — Ambulatory Visit (INDEPENDENT_AMBULATORY_CARE_PROVIDER_SITE_OTHER): Payer: BLUE CROSS/BLUE SHIELD | Admitting: Family Medicine

## 2017-02-13 ENCOUNTER — Encounter: Payer: Self-pay | Admitting: Family Medicine

## 2017-02-13 VITALS — BP 102/61 | HR 101 | Temp 97.4°F | Ht 60.0 in | Wt 125.0 lb

## 2017-02-13 DIAGNOSIS — Z01818 Encounter for other preprocedural examination: Secondary | ICD-10-CM | POA: Diagnosis not present

## 2017-02-13 NOTE — Progress Notes (Signed)
BP 102/61   Pulse (!) 101   Temp (!) 97.4 F (36.3 C) (Oral)   Ht 5' (1.524 m)   Wt 125 lb (56.7 kg)   BMI 24.41 kg/m    Subjective:    Patient ID: Catherine Haney, female    DOB: 04-24-1991, 26 y.o.   MRN: [id number]  HPI: Catherine Haney is a 26 y.o. female presenting on 02/13/2017 for discuss labs from 01/25/17 (interpreter [phone])   HPI Lab results for possible renal transplant donor Patient is coming in today to discuss lab results that we have done for being a possible renal transplant donor. She said that the site said that her testing was insufficient and we had thought that we had done sufficient testing for with a needed. She did not know exactly what they meant by this or what was missing and give some information so we can call them and discuss what else they might need. She denies any other issues and is otherwise doing very well today.  Relevant past medical, surgical, family and social history reviewed and updated as indicated. Interim medical history since our last visit reviewed. Allergies and medications reviewed and updated.  Review of Systems  Constitutional: Negative for chills and fever.  HENT: Negative for congestion, ear discharge and ear pain.   Eyes: Negative for redness and visual disturbance.  Respiratory: Negative for chest tightness and shortness of breath.   Cardiovascular: Negative for chest pain and leg swelling.  Gastrointestinal: Negative for abdominal pain.  Genitourinary: Negative for difficulty urinating, dysuria, flank pain and frequency.  Musculoskeletal: Negative for back pain and gait problem.  Skin: Negative for rash.  Neurological: Negative for light-headedness and headaches.  Psychiatric/Behavioral: Negative for agitation and behavioral problems.  All other systems reviewed and are negative.   Per HPI unless specifically indicated above      Objective:    BP 102/61   Pulse (!) 101   Temp (!) 97.4 F (36.3 C) (Oral)   Ht 5'  (1.524 m)   Wt 125 lb (56.7 kg)   BMI 24.41 kg/m   Wt Readings from Last 3 Encounters:  02/13/17 125 lb (56.7 kg)  02/07/17 126 lb (57.2 kg)  02/06/17 125 lb (56.7 kg)    Physical Exam  Constitutional: She is oriented to person, place, and time. She appears well-developed and well-nourished. No distress.  Eyes: Conjunctivae are normal.  Pulmonary/Chest: Effort normal and breath sounds normal.  Musculoskeletal: Normal range of motion.  Neurological: She is alert and oriented to person, place, and time. Coordination normal.  Skin: Skin is warm and dry. No rash noted. She is not diaphoretic.  Psychiatric: She has a normal mood and affect. Her behavior is normal.  Nursing note and vitals reviewed.   Results for orders placed or performed in visit on 01/25/17  Microalbumin, urine, 24 hour  Result Value Ref Range   Albumin, Urine 15.4 Not Estab. ug/mL   Albumin, mg/day 13.9 <30.0 mg/day  Sodium, urine, 24 hour  Result Value Ref Range   Sodium, Ur 234 Not Estab. mmol/L   Sodium, 24H Ur 211 39 - 258 mmol/24 hr  Creatinine clearance, urine, 24 hour  Result Value Ref Range   Creatinine, Ser 0.50 (L) 0.57 - 1.00 mg/dL   GFR calc non Af Amer 133 >59 mL/min/1.73   GFR calc Af Amer 153 >59 mL/min/1.73   Creatinine, Urine 96.4 Not Estab. mg/dL   Creatinine, 28U Ur 132 800 - 1,800 mg/24 hr   Creatinine  Clearance 121 88 - 128 mL/min      Assessment & Plan:   Problem List Items Addressed This Visit    None    Visit Diagnoses    Preop general physical exam    -  Primary   She comes in with questions about once needed for preoperative possibly donating a kidney. We did testing, attempted to call them about what they needed      Will call back on the next available to see if we can discover what is needed or what was missing.  Follow up plan: Return if symptoms worsen or fail to improve.  Counseling provided for all of the vaccine components No orders of the defined types were  placed in this encounter.   Arville Care, MD [facility] 02/13/2017, 4:47 PM

## 2017-02-17 ENCOUNTER — Telehealth: Payer: Self-pay | Admitting: Family Medicine

## 2017-02-17 NOTE — Telephone Encounter (Signed)
Looks like she recently had labs done for kidney function. Do you know about this? Please advise

## 2017-02-17 NOTE — Telephone Encounter (Signed)
See other note

## 2017-02-17 NOTE — Telephone Encounter (Signed)
Yes she is doing these for possibly being a renal transplant donor for her mother and they requested that we repeat these labs were not sufficient volume last time because she did not collect the full 24 hours correctly

## 2017-02-17 NOTE — Telephone Encounter (Signed)
Spoke with patient via Nurse, learning disability and explained to her we have received instruction from her mom's medical facility in [place] and have the list of lab work she needs to complete for them.  Ms. Catherine Haney to come in Monday for labwork and instruction.

## 2017-02-20 ENCOUNTER — Other Ambulatory Visit: Payer: Self-pay | Admitting: Family Medicine

## 2017-02-20 ENCOUNTER — Other Ambulatory Visit: Payer: BLUE CROSS/BLUE SHIELD

## 2017-02-20 DIAGNOSIS — Z01818 Encounter for other preprocedural examination: Secondary | ICD-10-CM

## 2017-02-20 LAB — URINALYSIS, COMPLETE
Bilirubin, UA: NEGATIVE
GLUCOSE, UA: NEGATIVE
KETONES UA: NEGATIVE
Nitrite, UA: NEGATIVE
PROTEIN UA: NEGATIVE
Specific Gravity, UA: 1.015 (ref 1.005–1.030)
Urobilinogen, Ur: 0.2 mg/dL (ref 0.2–1.0)
pH, UA: 7 (ref 5.0–7.5)

## 2017-02-20 LAB — MICROSCOPIC EXAMINATION
Epithelial Cells (non renal): >10 /hpf — AB (ref 0–10)
Renal Epithel, UA: NONE SEEN /hpf

## 2017-02-21 LAB — URINE CULTURE: ORGANISM ID, BACTERIA: NO GROWTH

## 2017-02-21 LAB — MICROALBUMIN / CREATININE URINE RATIO
CREATININE, UR: 96.4 mg/dL
MICROALBUM., U, RANDOM: 5.2 ug/mL
Microalb/Creat Ratio: 5.4 mg/g creat (ref 0.0–30.0)

## 2017-02-22 ENCOUNTER — Other Ambulatory Visit: Payer: BLUE CROSS/BLUE SHIELD

## 2017-02-22 DIAGNOSIS — Z01818 Encounter for other preprocedural examination: Secondary | ICD-10-CM

## 2017-02-22 LAB — BMP8+EGFR
BUN / CREAT RATIO: 21 (ref 9–23)
BUN: 11 mg/dL (ref 6–20)
CO2: 22 mmol/L (ref 20–29)
CREATININE: 0.52 mg/dL — AB (ref 0.57–1.00)
Calcium: 9.1 mg/dL (ref 8.7–10.2)
Chloride: 104 mmol/L (ref 96–106)
GFR calc Af Amer: 151 mL/min/{1.73_m2} (ref >59)
GFR calc non Af Amer: 131 mL/min/{1.73_m2} (ref >59)
GLUCOSE: 93 mg/dL (ref 65–99)
Potassium: 4.3 mmol/L (ref 3.5–5.2)
Sodium: 140 mmol/L (ref 134–144)

## 2017-02-23 LAB — CREATININE CLEARANCE, URINE, 24 HOUR
CREATININE 24H UR: 870 mg/(24.h) (ref 800–1800)
CREATININE, UR: 54.4 mg/dL
CREATININE: 0.48 mg/dL — AB (ref 0.57–1.00)
Creatinine Clearance: 126 mL/min (ref 88–128)
GFR calc Af Amer: 155 mL/min/{1.73_m2} (ref >59)
GFR, EST NON AFRICAN AMERICAN: 135 mL/min/{1.73_m2} (ref >59)

## 2017-02-23 LAB — PROTEIN, URINE, 24 HOUR
Protein, 24H Urine: 94 mg/(24.h) (ref 30–150)
Protein, Ur: 5.9 mg/dL

## 2017-02-27 ENCOUNTER — Other Ambulatory Visit: Payer: Self-pay | Admitting: Family Medicine

## 2017-02-27 DIAGNOSIS — M778 Other enthesopathies, not elsewhere classified: Secondary | ICD-10-CM

## 2017-04-07 ENCOUNTER — Ambulatory Visit (INDEPENDENT_AMBULATORY_CARE_PROVIDER_SITE_OTHER): Payer: BLUE CROSS/BLUE SHIELD | Admitting: Family Medicine

## 2017-04-07 ENCOUNTER — Encounter: Payer: Self-pay | Admitting: Family Medicine

## 2017-04-07 VITALS — BP 100/71 | HR 86 | Temp 97.4°F | Ht 61.0 in | Wt 123.8 lb

## 2017-04-07 DIAGNOSIS — J0111 Acute recurrent frontal sinusitis: Secondary | ICD-10-CM

## 2017-04-07 MED ORDER — FLUTICASONE PROPIONATE 50 MCG/ACT NA SUSP: 2.0000 | Freq: Every day | NASAL | 6 refills | Status: DC

## 2017-04-07 MED ORDER — AMOXICILLIN-POT CLAVULANATE 875-125 MG PO TABS: 1.0000 | ORAL_TABLET | Freq: Two times a day (BID) | ORAL | 0 refills | Status: DC

## 2017-04-07 MED ORDER — FLUCONAZOLE 150 MG PO TABS: ORAL_TABLET | ORAL | 0 refills | Status: DC

## 2017-04-07 NOTE — Patient Instructions (Signed)
Great to see you!  Try flonase long term for helping your congestion.   Augmentin is an antibiotic, finish all pills  Diflucan is for a yeast infection which can happen because of taking antibiotics. You only need to take this if you need it.    Sinusitis, Adult Sinusitis is soreness and inflammation of your sinuses. Sinuses are hollow spaces in the bones around your face. They are located:  Around your eyes.  In the middle of your forehead.  Behind your nose.  In your cheekbones.  Your sinuses and nasal passages are lined with a stringy fluid (mucus). Mucus normally drains out of your sinuses. When your nasal tissues get inflamed or swollen, the mucus can get trapped or blocked so air cannot flow through your sinuses. This lets bacteria, viruses, and funguses grow, and that leads to infection. Follow these instructions at home: Medicines  Take, use, or apply over-the-counter and prescription medicines only as told by your doctor. These may include nasal sprays.  If you were prescribed an antibiotic medicine, take it as told by your doctor. Do not stop taking the antibiotic even if you start to feel better. Hydrate and Humidify  Drink enough water to keep your pee (urine) clear or pale yellow.  Use a cool mist humidifier to keep the humidity level in your home above 50%.  Breathe in steam for 10-15 minutes, 3-4 times a day or as told by your doctor. You can do this in the bathroom while a hot shower is running.  Try not to spend time in cool or dry air. Rest  Rest as much as possible.  Sleep with your head raised (elevated).  Make sure to get enough sleep each night. General instructions  Put a warm, moist washcloth on your face 3-4 times a day or as told by your doctor. This will help with discomfort.  Wash your hands often with soap and water. If there is no soap and water, use hand sanitizer.  Do not smoke. Avoid being around people who are smoking (secondhand  smoke).  Keep all follow-up visits as told by your doctor. This is important. Contact a doctor if:  You have a fever.  Your symptoms get worse.  Your symptoms do not get better within 10 days. Get help right away if:  You have a very bad headache.  You cannot stop throwing up (vomiting).  You have pain or swelling around your face or eyes.  You have trouble seeing.  You feel confused.  Your neck is stiff.  You have trouble breathing. This information is not intended to replace advice given to you by your health care provider. Make sure you discuss any questions you have with your health care provider. Document Released: 10/26/2007 Document Revised: 01/03/2016 Document Reviewed: 03/04/2015 Elsevier Interactive Patient Education  [employer]2018 Elsevier Inc.

## 2017-04-07 NOTE — Progress Notes (Signed)
   HPI  Patient presents today here with cough.  Patient is Arabic speaking only, interview performed with video translator [phone]  Patient explains she has had 2-3 days of cough, frequent throat clearing, nasal congestion, and mild sore throat.  She has had subjective fever and shortness of breath at night.  Patient states that she is tolerating food and fluids like usual.  PMH: Smoking status noted ROS: Per HPI  Objective: BP 100/71   Pulse 86   Temp (!) 97.4 F (36.3 C) (Oral)   Ht 5\' 1"  (1.549 m)   Wt 123 lb 12.8 oz (56.2 kg)   BMI 23.39 kg/m  Gen: NAD, alert, cooperative with exam HEENT: NCAT, TMs normal bilaterally, tenderness to palpation of bilateral frontal sinuses, oropharynx moist and clear, nares with swollen turbinates CV: RRR, good S1/S2, no murmur Resp: CTABL, no wheezes, non-labored Ext: No edema, warm Neuro: Alert and oriented, No gross deficits  Assessment and plan:  #Acute frontal sinusitis Cough likely due to postnasal drip, patient states that she has recurrent episodes of this, recommended using Flonase long-term Augmentin, Flonase Patient has had yeast infections in the past, given Diflucan and explained its use if needed. Return to clinic as needed    Meds ordered this encounter  Medications  . omeprazole (PRILOSEC) 40 MG capsule    Sig: TAKE 1 CAPSULE DAILY ON EMPTY STOMACH    Refill:  3  . amoxicillin-clavulanate (AUGMENTIN) 875-125 MG tablet    Sig: Take 1 tablet 2 (two) times daily by mouth.    Dispense:  20 tablet    Refill:  0  . fluconazole (DIFLUCAN) 150 MG tablet    Sig: Take one pill and repeat in 3 days    Dispense:  2 tablet    Refill:  0  . fluticasone (FLONASE) 50 MCG/ACT nasal spray    Sig: Place 2 sprays daily into both nostrils.    Dispense:  16 g    Refill:  6    Murtis SinkSam Amaya Blakeman, MD [facility] 04/07/2017, 9:03 AM

## 2017-05-26 ENCOUNTER — Ambulatory Visit (INDEPENDENT_AMBULATORY_CARE_PROVIDER_SITE_OTHER): Payer: BLUE CROSS/BLUE SHIELD | Admitting: Family Medicine

## 2017-05-26 VITALS — BP 100/70 | HR 74 | Temp 97.1°F | Ht 61.0 in | Wt 126.0 lb

## 2017-05-26 DIAGNOSIS — K219 Gastro-esophageal reflux disease without esophagitis: Secondary | ICD-10-CM

## 2017-05-26 DIAGNOSIS — R1013 Epigastric pain: Secondary | ICD-10-CM | POA: Diagnosis not present

## 2017-05-26 DIAGNOSIS — J302 Other seasonal allergic rhinitis: Secondary | ICD-10-CM | POA: Diagnosis not present

## 2017-05-26 MED ORDER — RANITIDINE HCL 150 MG PO TABS: 150.0000 mg | ORAL_TABLET | Freq: Two times a day (BID) | ORAL | 0 refills | Status: DC

## 2017-05-26 MED ORDER — LORATADINE 10 MG PO TABS: 10.0000 mg | ORAL_TABLET | Freq: Every day | ORAL | 11 refills | Status: DC

## 2017-05-26 MED ORDER — OMEPRAZOLE 20 MG PO CPDR: 20.0000 mg | DELAYED_RELEASE_CAPSULE | Freq: Every day | ORAL | 0 refills | Status: DC

## 2017-05-26 NOTE — Patient Instructions (Signed)
I want you to take omeprazole once daily, ranitidine twice daily and Claritin once daily.  I will contact you with results of your labs once they are available.

## 2017-05-26 NOTE — Progress Notes (Signed)
Subjective: CC: abdominal pain PCP: Dettinger, Fransisca Kaufmann, MD XNT:ZGYFV Catherine Haney is a 27 y.o. female presenting to clinic today for:  [employer] video interpreter [place], [place] used for Arabic translation of this visit   1. Abdominal pain Patient reports a long-standing history of abdominal pain.  She notes that it has been more prominent over the last month.  She reports abdominal pain is intermittent.  She notes associated nausea with vomiting when the abdominal pain is bad.  She points to her left upper quadrant as the source of pain.  She does have a history of acid reflux and is on omeprazole for this.  She takes this daily but notes that her symptoms are not well controlled.  Denies hematochezia, melena, bloody or bilious vomiting.  She is able to hydrate and eat generally normally.  She has an IUD in place for pregnancy prevention.  No fevers, chills.  No abnormal vaginal discharge.  No dysuria, hematuria, urinary urgency or frequency.  She reports regular bowel movements but notes that she occasionally will have diarrhea alternating with constipation.  2.  Rhinitis Patient reports that she has had recurrent URIs.  She reports that she uses the Flonase during the winter months but this does not seem to be helping with her nasal congestion.  She finds it hard to breathe through her nose at nighttime which in effect causes poor sleep.  She is wondering if there is anything else she can take for symptoms.   ROS: Per HPI  No Known Allergies Past Medical History:  Diagnosis Date  . GERD (gastroesophageal reflux disease)     Current Outpatient Medications:  .  amoxicillin-clavulanate (AUGMENTIN) 875-125 MG tablet, Take 1 tablet 2 (two) times daily by mouth., Disp: 20 tablet, Rfl: 0 .  fluconazole (DIFLUCAN) 150 MG tablet, Take one pill and repeat in 3 days, Disp: 2 tablet, Rfl: 0 .  fluticasone (FLONASE) 50 MCG/ACT nasal spray, Place 2 sprays daily into both nostrils., Disp: 16 g, Rfl:  6 .  levonorgestrel (MIRENA) 20 MCG/24HR IUD, 1 each by Intrauterine route once., Disp: , Rfl:  .  omeprazole (PRILOSEC) 40 MG capsule, TAKE 1 CAPSULE DAILY ON EMPTY STOMACH, Disp: , Rfl: 3 Social History   Socioeconomic History  . Marital status: Married    Spouse name: Not on file  . Number of children: Not on file  . Years of education: Not on file  . Highest education level: Not on file  Social Needs  . Financial resource strain: Not on file  . Food insecurity - worry: Not on file  . Food insecurity - inability: Not on file  . Transportation needs - medical: Not on file  . Transportation needs - non-medical: Not on file  Occupational History  . Not on file  Tobacco Use  . Smoking status: Never Smoker  . Smokeless tobacco: Never Used  Substance and Sexual Activity  . Alcohol use: No    Alcohol/week: 0.0 oz  . Drug use: No  . Sexual activity: Yes    Birth control/protection: IUD  Other Topics Concern  . Not on file  Social History Narrative  . Not on file   Family History  Problem Relation Age of Onset  . Hypertension Mother   . Diabetes Father     Objective: Office vital signs reviewed. BP 100/70   Pulse 74   Temp (!) 97.1 F (36.2 C) (Oral)   Ht '5\' 1"'  (1.549 m)   Wt 126 lb (57.2 kg)  BMI 23.81 kg/m   Physical Examination:  General: Awake, alert, nontoxic appearing, No acute distress HEENT: Normal    Neck: No masses palpated. No lymphadenopathy    Eyes: PERRLA, extraocular membranes intact, sclera white    Nose: nasal turbinates moist, no nasal discharge    Throat: moist mucus membranes, no erythema Cardio: regular rate and rhythm, S1S2 heard, no murmurs appreciated Pulm: clear to auscultation bilaterally, no wheezes, rhonchi or rales; normal work of breathing on room air GI: soft, +epigastric TTP, non-distended, bowel sounds present x4, no hepatomegaly, no splenomegaly, no masses  Assessment/ Plan: 27 y.o. female   1. Epigastric pain Patient is  well-appearing on exam.  There is no concern for peritonitis at this time.  Likely related to underlying gastro-esophageal reflux disease.  It does not appear that she has been tested for H. pylori in the past.  We will check this as a possible etiology of symptoms.  For now, omeprazole 20 mg daily refilled.  Add Zantac 150 mg twice daily.  Claritin added for sinus issues and it may also help with reduce acid production. - CMP14+EGFR - CBC with Differential - H Pylori, IGM, IGG, IGA AB  2. Gastroesophageal reflux disease, esophagitis presence not specified See above  3. Other seasonal allergic rhinitis No evidence of infection.  Patient is well-appearing.  Continue Flonase daily.  Add Claritin daily.  Follow-up with PCP as needed.   Orders Placed This Encounter  Procedures  . CMP14+EGFR  . CBC with Differential  . H Pylori, IGM, IGG, IGA AB   Meds ordered this encounter  Medications  . omeprazole (PRILOSEC) 20 MG capsule    Sig: Take 1 capsule (20 mg total) by mouth daily.    Dispense:  30 capsule    Refill:  0  . ranitidine (RANITIDINE 150 MAX STRENGTH) 150 MG tablet    Sig: Take 1 tablet (150 mg total) by mouth 2 (two) times daily.    Dispense:  60 tablet    Refill:  0  . loratadine (CLARITIN) 10 MG tablet    Sig: Take 1 tablet (10 mg total) by mouth daily.    Dispense:  30 tablet    Refill:  11   Patient will see me back in 2 weeks for IUD removal.   Janora Norlander, [place] [phone]

## 2017-05-27 NOTE — Progress Notes (Signed)
Subjective: CC: IUD removal PCP: Dettinger, Elige RadonJoshua A, MD AOZ:HYQMVHPI:Catherine Haney is a 27 y.o. female presenting to clinic today for:  1. IUD removal Patient wishes to have IUD removed.  She would like to start OCPs for birth control.  She strongly believes that the IUD is responsible for a left-sided pelvic discomfort that she has been experiencing.  She notes that she would be amenable to reinsertion in the future if she finds that pain does not resolve after IUD removal.  She does not wish to become pregnant.  2.  Abdominal pain Labs were reviewed with the patient with regards to recent positive H. pylori IgG.   ROS: Per HPI  No Known Allergies Past Medical History:  Diagnosis Date  . GERD (gastroesophageal reflux disease)     Current Outpatient Medications:  .  levonorgestrel (MIRENA) 20 MCG/24HR IUD, 1 each by Intrauterine route once., Disp: , Rfl:  .  loratadine (CLARITIN) 10 MG tablet, Take 1 tablet (10 mg total) by mouth daily., Disp: 30 tablet, Rfl: 11 .  omeprazole (PRILOSEC) 20 MG capsule, Take 1 capsule (20 mg total) by mouth daily., Disp: 30 capsule, Rfl: 0 .  ranitidine (RANITIDINE 150 MAX STRENGTH) 150 MG tablet, Take 1 tablet (150 mg total) by mouth 2 (two) times daily., Disp: 60 tablet, Rfl: 0 Social History   Socioeconomic History  . Marital status: Married    Spouse name: Not on file  . Number of children: Not on file  . Years of education: Not on file  . Highest education level: Not on file  Social Needs  . Financial resource strain: Not on file  . Food insecurity - worry: Not on file  . Food insecurity - inability: Not on file  . Transportation needs - medical: Not on file  . Transportation needs - non-medical: Not on file  Occupational History  . Not on file  Tobacco Use  . Smoking status: Never Smoker  . Smokeless tobacco: Never Used  Substance and Sexual Activity  . Alcohol use: No    Alcohol/week: 0.0 oz  . Drug use: No  . Sexual activity: Yes   Birth control/protection: IUD  Other Topics Concern  . Not on file  Social History Narrative  . Not on file   Family History  Problem Relation Age of Onset  . Hypertension Mother   . Diabetes Father     Objective: Office vital signs reviewed. There were no vitals taken for this visit.  Physical Examination:  General: Awake, alert, well nourished, No acute distress Cardio: regular rate and rhythm, +2 distal pulses Pulm: Normal work of breathing on room air, no wheezes. GU: external vaginal tissue normal, cervix midline, no punctate lesions on cervix appreciated, moderate brown discharge from cervical os, no abdominal/ adnexal masses  IUD Removal: Patient given informed consent, signed copy in the chart..  Negative pregnancy confirmed.  GC/CT also negative.  Appropriate time out taken.   Sterile instruments and technique was used. Cervix brought into view with use of speculum and then cleansed three times with  betadine swabs.  A Mirena IUD was removed from the endometrial cavity using ring forceps and demonstrated to patient.  There were no complications and the patient tolerated the procedure well.  She was given handouts for post procedure instructions.   Assessment/ Plan: 27 y.o. female   1. Encounter for IUD removal IUD removed successfully.  No immediate complications.  Home care instructions reviewed with the patient.  She voiced good understanding.  Ortho-Cyclen p.o. daily was prescribed.  I did recommend a backup form of contraception for the next week.  She will follow-up if she desires reinsertion of IUD.  2. Helicobacter pylori ab+ IgG was positive.  IgM was negative.  Does not appear to have an acute infection but perhaps has a chronic infection.  Will empirically treat with triple therapy.  Biaxin 500 mg p.o. twice daily if by 14 days, amoxicillin 1 g p.o. twice daily by 14 days.  She already has a PPI at home.  I instructed her to continue this.  Follow-up in 3 weeks with  PCP.  Meds ordered this encounter  Medications  . amoxicillin (AMOXIL) 500 MG capsule    Sig: Take 2 capsules (1,000 mg total) by mouth 2 (two) times daily for 14 days.    Dispense:  56 capsule    Refill:  0  . clarithromycin (BIAXIN) 500 MG tablet    Sig: Take 1 tablet (500 mg total) by mouth 2 (two) times daily for 14 days.    Dispense:  28 tablet    Refill:  0  . norgestimate-ethinyl estradiol (ORTHO-CYCLEN, 28,) 0.25-35 MG-MCG tablet    Sig: Take 1 tablet by mouth daily.    Dispense:  3 Package    Refill:  0     Raliegh Ip, DO [facility] [phone]

## 2017-05-29 LAB — CBC WITH DIFFERENTIAL/PLATELET
BASOS: 0 %
Basophils Absolute: 0 10*3/uL (ref 0.0–0.2)
EOS (ABSOLUTE): 0.1 10*3/uL (ref 0.0–0.4)
EOS: 1 %
HEMATOCRIT: 37.5 % (ref 34.0–46.6)
HEMOGLOBIN: 12.5 g/dL (ref 11.1–15.9)
IMMATURE GRANS (ABS): 0 10*3/uL (ref 0.0–0.1)
Immature Granulocytes: 0 %
LYMPHS: 39 %
Lymphocytes Absolute: 2.3 10*3/uL (ref 0.7–3.1)
MCH: 27.5 pg (ref 26.6–33.0)
MCHC: 33.3 g/dL (ref 31.5–35.7)
MCV: 83 fL (ref 79–97)
MONOCYTES: 6 %
Monocytes Absolute: 0.3 10*3/uL (ref 0.1–0.9)
NEUTROS PCT: 54 %
Neutrophils Absolute: 3.2 10*3/uL (ref 1.4–7.0)
Platelets: 367 10*3/uL (ref 150–379)
RBC: 4.54 x10E6/uL (ref 3.77–5.28)
RDW: 13.4 % (ref 12.3–15.4)
WBC: 5.9 10*3/uL (ref 3.4–10.8)

## 2017-05-29 LAB — CMP14+EGFR
ALK PHOS: 78 IU/L (ref 39–117)
ALT: 16 IU/L (ref 0–32)
AST: 18 IU/L (ref 0–40)
Albumin/Globulin Ratio: 1.5 (ref 1.2–2.2)
Albumin: 4.7 g/dL (ref 3.5–5.5)
BUN/Creatinine Ratio: 21 (ref 9–23)
BUN: 11 mg/dL (ref 6–20)
Bilirubin Total: 0.3 mg/dL (ref 0.0–1.2)
CALCIUM: 9.5 mg/dL (ref 8.7–10.2)
CO2: 21 mmol/L (ref 20–29)
CREATININE: 0.53 mg/dL — AB (ref 0.57–1.00)
Chloride: 101 mmol/L (ref 96–106)
GFR calc Af Amer: 150 mL/min/{1.73_m2} (ref >59)
GFR, EST NON AFRICAN AMERICAN: 131 mL/min/{1.73_m2} (ref >59)
GLUCOSE: 90 mg/dL (ref 65–99)
Globulin, Total: 3.2 g/dL (ref 1.5–4.5)
Potassium: 4.3 mmol/L (ref 3.5–5.2)
Sodium: 139 mmol/L (ref 134–144)
Total Protein: 7.9 g/dL (ref 6.0–8.5)

## 2017-05-29 LAB — H PYLORI, IGM, IGG, IGA AB
H pylori, IgM Abs: <9 units (ref 0.0–8.9)
H. pylori, IgG AbS: 1.85 Index Value — ABNORMAL HIGH (ref 0.00–0.79)

## 2017-05-31 ENCOUNTER — Ambulatory Visit (INDEPENDENT_AMBULATORY_CARE_PROVIDER_SITE_OTHER): Payer: BLUE CROSS/BLUE SHIELD | Admitting: Family Medicine

## 2017-05-31 VITALS — BP 107/72 | HR 77 | Temp 98.3°F | Ht 61.0 in | Wt 126.0 lb

## 2017-05-31 DIAGNOSIS — Z30432 Encounter for removal of intrauterine contraceptive device: Secondary | ICD-10-CM | POA: Diagnosis not present

## 2017-05-31 DIAGNOSIS — R768 Other specified abnormal immunological findings in serum: Secondary | ICD-10-CM | POA: Diagnosis not present

## 2017-05-31 MED ORDER — AMOXICILLIN 500 MG PO CAPS: 1000.0000 mg | ORAL_CAPSULE | Freq: Two times a day (BID) | ORAL | 0 refills | Status: AC

## 2017-05-31 MED ORDER — CLARITHROMYCIN 500 MG PO TABS: 500.0000 mg | ORAL_TABLET | Freq: Two times a day (BID) | ORAL | 0 refills | Status: AC

## 2017-05-31 MED ORDER — NORGESTIMATE-ETH ESTRADIOL 0.25-35 MG-MCG PO TABS: 1.0000 | ORAL_TABLET | Freq: Every day | ORAL | 0 refills | Status: DC

## 2017-05-31 NOTE — Patient Instructions (Addendum)
IUD REMOVAL POST-PROCEDURE INSTRUCTIONS  1. You may take Ibuprofen, Aleve or Tylenol for pain if needed.  Cramping should resolve within in 24 hours.  2. You may have a small amount of spotting.    3. You should use a backup form of contraception if you do not desire pregnancy.   4. You need to call if you have any pelvic pain, fever, heavy bleeding or foul smelling vaginal discharge.   5. Shower or bathe as normal   Your recent blood work did show evidence of previous and possibly current bacterial infection in your stomach that may be why you are having such uncontrolled acid reflux.  I have put you on antibiotics to take twice a day for the next 14 days.  Continue the acid medications that I prescribed at last visit.  Follow-up with Dr. Louanne Skyeettinger in 3 weeks.

## 2017-06-19 ENCOUNTER — Other Ambulatory Visit: Payer: Self-pay

## 2017-06-19 MED ORDER — OMEPRAZOLE 20 MG PO CPDR: 20.0000 mg | DELAYED_RELEASE_CAPSULE | Freq: Every day | ORAL | 0 refills | Status: DC

## 2017-06-19 MED ORDER — RANITIDINE HCL 150 MG PO TABS: 150.0000 mg | ORAL_TABLET | Freq: Two times a day (BID) | ORAL | 0 refills | Status: DC

## 2017-06-29 ENCOUNTER — Encounter: Payer: Self-pay | Admitting: Family Medicine

## 2017-06-29 ENCOUNTER — Ambulatory Visit (INDEPENDENT_AMBULATORY_CARE_PROVIDER_SITE_OTHER): Payer: BLUE CROSS/BLUE SHIELD | Admitting: Family Medicine

## 2017-06-29 VITALS — BP 103/70 | HR 80 | Temp 98.2°F | Ht 61.0 in | Wt 127.0 lb

## 2017-06-29 DIAGNOSIS — R102 Pelvic and perineal pain: Secondary | ICD-10-CM | POA: Diagnosis not present

## 2017-06-29 DIAGNOSIS — B373 Candidiasis of vulva and vagina: Secondary | ICD-10-CM

## 2017-06-29 DIAGNOSIS — B3731 Acute candidiasis of vulva and vagina: Secondary | ICD-10-CM

## 2017-06-29 LAB — URINALYSIS, COMPLETE
BILIRUBIN UA: NEGATIVE
Glucose, UA: NEGATIVE
Ketones, UA: NEGATIVE
LEUKOCYTES UA: NEGATIVE
Nitrite, UA: NEGATIVE
PH UA: 6.5 (ref 5.0–7.5)
PROTEIN UA: NEGATIVE
RBC UA: NEGATIVE
SPEC GRAV UA: 1.02 (ref 1.005–1.030)
Urobilinogen, Ur: 1 mg/dL (ref 0.2–1.0)

## 2017-06-29 LAB — MICROSCOPIC EXAMINATION
RBC, UA: NONE SEEN /hpf (ref 0–?)
RENAL EPITHEL UA: NONE SEEN /HPF

## 2017-06-29 LAB — PREGNANCY, URINE: PREG TEST UR: NEGATIVE

## 2017-06-29 LAB — WET PREP FOR TRICH, YEAST, CLUE
Clue Cell Exam: NEGATIVE
Trichomonas Exam: NEGATIVE
Yeast Exam: POSITIVE — AB

## 2017-06-29 MED ORDER — FLUCONAZOLE 150 MG PO TABS: 150.0000 mg | ORAL_TABLET | Freq: Once | ORAL | 0 refills | Status: AC

## 2017-06-29 NOTE — Progress Notes (Signed)
BP 103/70   Pulse 80   Temp 98.2 F (36.8 C) (Oral)   Ht 5\' 1"  (1.549 m)   Wt 127 lb (57.6 kg)   BMI 24.00 kg/m    Subjective:    Patient ID: Catherine Haney, female    DOB: [birth year], 27 y.o.   MRN: [id number]  HPI: Catherine Haney is a 27 y.o. female presenting on 06/29/2017 for Abdominal pain, back pain (recently positive for H Pylori, symptoms began after she finished treatment; interpretor Azad [phone])   HPI Patient is coming in complaining of lower abdominal pain and back pain.  She was recently treated for H. pylori and also had her IUD removed 1 month ago she has not been on any kind of birth control currently.  She denies any vaginal irritation or discharge that she knows of.  She does have urinary frequency but denies any other urinary symptoms.  She denies any constipation or diarrhea or nausea or vomiting.  She was recently treated for H. pylori and finished that about 4 days ago which is when symptoms began.  She says the pain radiates to her back and is moderate to severe in intensity.  She has not had a period since she had IUD but did have a little bleeding about 10 days ago that lasted for a day.  Relevant past medical, surgical, family and social history reviewed and updated as indicated. Interim medical history since our last visit reviewed. Allergies and medications reviewed and updated.  Review of Systems  Constitutional: Negative for chills and fever.  Eyes: Negative for visual disturbance.  Respiratory: Negative for chest tightness and shortness of breath.   Cardiovascular: Negative for chest pain and leg swelling.  Gastrointestinal: Positive for abdominal pain. Negative for blood in stool, constipation, diarrhea, nausea and vomiting.  Genitourinary: Positive for frequency and pelvic pain. Negative for difficulty urinating, dysuria, flank pain, hematuria, vaginal bleeding, vaginal discharge and vaginal pain.  Musculoskeletal: Negative for back pain and gait problem.   Skin: Negative for rash.  Neurological: Negative for light-headedness and headaches.  Psychiatric/Behavioral: Negative for agitation and behavioral problems.  All other systems reviewed and are negative.   Per HPI unless specifically indicated above   Allergies as of 06/29/2017   No Known Allergies     Medication List        Accurate as of 06/29/17  8:52 AM. Always use your most recent med list.          ranitidine 150 MG tablet Commonly known as:  RANITIDINE 150 MAX STRENGTH Take 1 tablet (150 mg total) by mouth 2 (two) times daily.          Objective:    BP 103/70   Pulse 80   Temp 98.2 F (36.8 C) (Oral)   Ht 5\' 1"  (1.549 m)   Wt 127 lb (57.6 kg)   BMI 24.00 kg/m   Wt Readings from Last 3 Encounters:  06/29/17 127 lb (57.6 kg)  05/31/17 126 lb (57.2 kg)  05/26/17 126 lb (57.2 kg)    Physical Exam  Constitutional: She is oriented to person, place, and time. She appears well-developed and well-nourished. No distress.  Eyes: Conjunctivae are normal.  Cardiovascular: Normal rate, regular rhythm, normal heart sounds and intact distal pulses.  No murmur heard. Pulmonary/Chest: Effort normal and breath sounds normal. No respiratory distress. She has no wheezes. She has no rales.  Abdominal: Soft. Bowel sounds are normal. She exhibits no distension. There is no hepatosplenomegaly. There is  tenderness in the suprapubic area. There is no rigidity, no rebound, no guarding and no CVA tenderness.  Musculoskeletal: Normal range of motion. She exhibits no edema or tenderness.  Neurological: She is alert and oriented to person, place, and time. Coordination normal.  Skin: Skin is warm and dry. No rash noted. She is not diaphoretic.  Psychiatric: She has a normal mood and affect. Her behavior is normal.  Nursing note and vitals reviewed.   Urinalysis: 0-5 WBCs, 0-10 epithelial cells, few bacteria  urine pregnancy: Negative Wet prep: Few yeast     Assessment & Plan:    Problem List Items Addressed This Visit    None    Visit Diagnoses    Pelvic pain    -  Primary   Relevant Orders   Urinalysis, Complete   Urine Culture   WET PREP FOR TRICH, YEAST, CLUE   Pregnancy, urine   Yeast vaginitis       Relevant Medications   fluconazole (DIFLUCAN) 150 MG tablet       Follow up plan: Return if symptoms worsen or fail to improve.  Counseling provided for all of the vaccine components Orders Placed This Encounter  Procedures  . Urine Culture  . WET PREP FOR TRICH, YEAST, CLUE  . Urinalysis, Complete    Arville CareJoshua Dettinger, MD [facility] 06/29/2017, 8:52 AM

## 2017-06-30 LAB — URINE CULTURE: ORGANISM ID, BACTERIA: NO GROWTH

## 2017-07-10 ENCOUNTER — Telehealth: Payer: Self-pay | Admitting: Family Medicine

## 2017-07-10 DIAGNOSIS — R102 Pelvic and perineal pain: Secondary | ICD-10-CM

## 2017-07-10 NOTE — Telephone Encounter (Signed)
Pt is having continued abdominal pain Medication did not help Wants to go ahead and schedule US Please advise

## 2017-07-10 NOTE — Telephone Encounter (Signed)
Continued pelvic pain despite treatment for yeast vaginitis, will schedule for transvaginal and pelvic ultrasound Arville CareJoshua Rufus Cypert, MD [facility] 07/10/2017, 4:04 PM

## 2017-07-17 ENCOUNTER — Ambulatory Visit: Payer: BLUE CROSS/BLUE SHIELD | Admitting: Family Medicine

## 2017-07-18 ENCOUNTER — Ambulatory Visit (INDEPENDENT_AMBULATORY_CARE_PROVIDER_SITE_OTHER): Payer: BLUE CROSS/BLUE SHIELD | Admitting: Family Medicine

## 2017-07-18 ENCOUNTER — Encounter: Payer: Self-pay | Admitting: Family Medicine

## 2017-07-18 VITALS — BP 119/72 | HR 82 | Temp 97.2°F | Ht 61.0 in | Wt 125.0 lb

## 2017-07-18 DIAGNOSIS — R102 Pelvic and perineal pain: Secondary | ICD-10-CM

## 2017-07-18 DIAGNOSIS — O26891 Other specified pregnancy related conditions, first trimester: Secondary | ICD-10-CM | POA: Diagnosis not present

## 2017-07-18 NOTE — Progress Notes (Signed)
   HPI  Patient presents today with abdominal pain and pregnancy.  Pt  is Arabic speaking, Arabic video translator Asmaa [phone]   Patient states that a few weeks ago she had a positive pregnancy test at the emergency room.  She had a normal ultrasound this week and states that about 5 days ago she had an episode of bleeding.  Patient explains she has crampy right lower pelvic abdominal pain.  She has not had any additional bleeding and the ultrasound was done after the bleeding episode and was normal.  She reports that Zantac is helping her abdominal pain that is separate from her pelvic pain that she is describing.   PMH: Smoking status noted ROS: Per HPI  Objective: BP 119/72   Pulse 82   Temp (!) 97.2 F (36.2 C) (Oral)   Ht 5\' 1"  (1.549 m)   Wt 125 lb (56.7 kg)   BMI 23.62 kg/m  Gen: NAD, alert, cooperative with exam HEENT: NCAT CV: RRR, good S1/S2, no murmur Resp: CTABL, no wheezes, non-labored Abd: Soft, no guarding, mild tenderness to palpation of right upper quadrant and right lower quadrant/right pelvic area, positive bowel sounds Ext: No edema, warm Neuro: Alert and oriented, No gross deficits  Assessment and plan:  #Pelvic pain Crampy pelvic pain in early pregnancy, checking beta hCG today and requesting GYN appointment. No signs of spontaneous Ab with reported normal US and no additional bleeding [facility]   Orders Placed This Encounter  Procedures  . hCG, quantitative, pregnancy  . Beta HCG, Quant  . Ambulatory referral to Obstetrics / Gynecology    Referral Priority:   Urgent    Referral Type:   Consultation    Referral Reason:   Specialty Services Required    Requested Specialty:   Obstetrics and Gynecology    Number of Visits Requested:   1    No orders of the defined types were placed in this encounter.   Murtis SinkSam Jaydan Chretien, MD [facility] 07/18/2017, 9:00 AM

## 2017-07-18 NOTE — Patient Instructions (Signed)
Great to see you!  Congratulations on your preganancy!  We will work on a referral and let you know about your lab work within 1 week.   You can safely take zantac

## 2017-07-19 LAB — BETA HCG QUANT (REF LAB): hCG Quant: 6180 m[IU]/mL

## 2017-07-21 ENCOUNTER — Ambulatory Visit (HOSPITAL_COMMUNITY): Payer: BLUE CROSS/BLUE SHIELD

## 2017-07-25 ENCOUNTER — Encounter: Payer: Self-pay | Admitting: *Deleted

## 2017-07-28 ENCOUNTER — Telehealth: Payer: Self-pay | Admitting: Family Medicine

## 2017-07-28 ENCOUNTER — Other Ambulatory Visit: Payer: Self-pay | Admitting: Obstetrics & Gynecology

## 2017-07-28 DIAGNOSIS — O3680X0 Pregnancy with inconclusive fetal viability, not applicable or unspecified: Secondary | ICD-10-CM

## 2017-07-28 NOTE — Telephone Encounter (Signed)
Patient was calling to see what results were of labs. Results given through interpreter

## 2017-07-31 ENCOUNTER — Ambulatory Visit (INDEPENDENT_AMBULATORY_CARE_PROVIDER_SITE_OTHER): Payer: BLUE CROSS/BLUE SHIELD

## 2017-07-31 DIAGNOSIS — O3680X0 Pregnancy with inconclusive fetal viability, not applicable or unspecified: Secondary | ICD-10-CM

## 2017-07-31 DIAGNOSIS — Z3A01 Less than 8 weeks gestation of pregnancy: Secondary | ICD-10-CM | POA: Diagnosis not present

## 2017-07-31 NOTE — Progress Notes (Signed)
US 6+2 wks,single IUP w/enlarged YS 8.4 mm,positive FHT 86 bpm,normal ovaries bilat,crl 5.8 mm,pt will come back for F/U ultrasound per Selena BattenKim

## 2017-08-08 ENCOUNTER — Other Ambulatory Visit: Payer: Self-pay | Admitting: Obstetrics & Gynecology

## 2017-08-08 DIAGNOSIS — O3680X0 Pregnancy with inconclusive fetal viability, not applicable or unspecified: Secondary | ICD-10-CM

## 2017-08-09 ENCOUNTER — Encounter: Payer: Self-pay | Admitting: Women's Health

## 2017-08-09 ENCOUNTER — Ambulatory Visit (INDEPENDENT_AMBULATORY_CARE_PROVIDER_SITE_OTHER): Payer: BLUE CROSS/BLUE SHIELD | Admitting: Women's Health

## 2017-08-09 ENCOUNTER — Telehealth: Payer: Self-pay | Admitting: *Deleted

## 2017-08-09 ENCOUNTER — Ambulatory Visit (INDEPENDENT_AMBULATORY_CARE_PROVIDER_SITE_OTHER): Payer: BLUE CROSS/BLUE SHIELD

## 2017-08-09 VITALS — BP 132/62 | HR 82 | Ht 60.0 in | Wt 124.5 lb

## 2017-08-09 DIAGNOSIS — Z3A01 Less than 8 weeks gestation of pregnancy: Secondary | ICD-10-CM | POA: Diagnosis not present

## 2017-08-09 DIAGNOSIS — O021 Missed abortion: Secondary | ICD-10-CM | POA: Diagnosis not present

## 2017-08-09 DIAGNOSIS — O3680X0 Pregnancy with inconclusive fetal viability, not applicable or unspecified: Secondary | ICD-10-CM

## 2017-08-09 MED ORDER — MISOPROSTOL 200 MCG PO TABS: 800.0000 ug | ORAL_TABLET | Freq: Once | ORAL | 1 refills | Status: DC

## 2017-08-09 NOTE — Telephone Encounter (Signed)
Got interpreter on phone. Pt had no fetal tones this am. Pt states her husband don't want her to take med because of chance of excessive bleeding. Selena BattenKim, CNM talked to interpreter explaining how med will start the bleeding so she can pass baby. Pt can hold off on starting med to see if body starts process on it's on. JSY

## 2017-08-09 NOTE — Progress Notes (Signed)
   GYN VISIT Patient name: Catherine Haney MRN [id number]  Date of birth: [birth year] Chief Complaint:   discuss US  History of Present Illness:   Catherine Haney is a 27 y.o. 33P2002 female at 6320w4d being seen today for missed abortion. Had u/s 07/31/17 which revealed CRL c/w 6475w2d w/ enlarged yolk sac and FHR 86. Today's u/s revealed CRL c/w 5275w2d, GS c/w 7532w3d, no FHT indicating missed ab. Pt denies cramping or bleeding.   Patient's last menstrual period was 06/02/2017 (approximate). The current method of family planning is none. Review of Systems:   Pertinent items are noted in HPI Denies fever/chills, dizziness, headaches, visual disturbances, fatigue, shortness of breath, chest pain, abdominal pain, vomiting, abnormal vaginal discharge/itching/odor/irritation, problems with periods, bowel movements, urination, or intercourse unless otherwise stated above.  Pertinent History Reviewed:  Reviewed past medical,surgical, social, obstetrical and family history.  Reviewed problem list, medications and allergies. Physical Assessment:   Vitals:   08/09/17 0851  BP: 132/62  Pulse: 82  Weight: 124 lb 8 oz (56.5 kg)  Height: 5' (1.524 m)  Body mass index is 24.31 kg/m.       Physical Examination:   General appearance: alert, well appearing, and in no distress  Mental status: alert, oriented to person, place, and time  Skin: warm & dry   Cardiovascular: normal heart rate noted  Respiratory: normal respiratory effort, no distress  Abdomen: soft, non-tender   Pelvic: examination not indicated  Extremities: no edema   No results found for this or any previous visit (from the past 24 hour(s)).  Assessment & Plan:  1) 5920w4d missed Ab> CRL c/w 3375w2d, discussed options of expectant management vs. cytotec vs. D&C, however D&C usually last option d/t risks w/ surgery. Pt opts for cytotec. Rx cytotec 800mcg pv x1, repeat in 48hrs if needed. Plans to take on Friday. Discussed what to expect w/ miscarriage,  as well as warning s/s and reasons to seek care. Will get BHCG, ABO/Rh, and CBC today.  [facility] for [facility]  Meds: Cytotec 800mcg pv x 1, repeat in 48hrs if needed  Orders Placed This Encounter  Procedures  . CBC  . Beta hCG quant (ref lab)  . ABO/Rh    Return in about 1 week (around 08/16/2017) for F/U.  Cheral MarkerKimberly R Nessa Ramaker CNM, [facility] 08/09/2017 9:52 AM

## 2017-08-09 NOTE — Patient Instructions (Signed)
FACTS YOU SHOULD KNOW  About Early Pregnancy Loss  WHAT IS AN EARLY PREGNANCY LOSS? Once the egg is fertilized with the sperm and begins to develop, it attaches to the lining of the uterus. This early pregnancy tissue may not develop into an embryo (the beginning stage of a baby). Sometimes an embryo does develop but does not continue to grow. These problems can be seen on ultrasound.   MANAGEMNT OF EARLY PREGNANCY LOSS: About 4 out of 100 (0.25%) women will have a pregnancy loss in her lifetime.  One in five pregnancies is found to be an early pregnancy loss.  There are 3 ways to care for an early pregnancy loss:   (1) Surgery, (2) Medicine, (3) Waiting for you to pass the pregnancy on your own. The decision as to how to proceed after being diagnosed with and early pregnancy loss is an individual one.  The decision can be made only after appropriate counseling.  You need to weigh the pros and cons of the 3 choices. Then you can make the choice that works for you.  SURGERY (D&E) . Procedure over in 1 day . Requires being put to sleep . Bleeding may be light . Possible problems during surgery, including injury to womb(uterus) . Care provider has more control Medicine (CYTOTEC) . The complete procedure may take days to weeks . No Surgery . Bleeding may be heavy at times . There may be drug side effects . Patient has more control Waiting . You may choose to wait, in which case your own body may complete the passing of the abnormal early pregnancy on its own in about 2-4 weeks . Your bleeding may be heavy at times . There is a small possibility that you may need surgery if the bleeding is too much or not all of the pregnancy has passed.  CYTOTEC MANAGEMENT Prostaglandins (cytotec) are the most widely used drug for this purpose. They cause the uterus to cramp and contract. You will place the medicine yourself inside your vagina in the privacy of your home. Empting of the uterus should occur  within 3 days but the process may continue for several weeks. The bleeding may seem heavy at times.  INSTRUCTIONS: Take all 4 tablets of cytotec (800mcg total) at one time. This will cause a lot of cramping, you may have bleeding, and pass tissue, then the cramping and bleeding should get better. If you do not pass the tissue, then you can take 4 more tablets of cytotec (800mcg total) 48 hours after your first dose.  You will come back to have your blood drawn to make sure the pregnancy hormones are dropping in 1 week. Please call us if you have any questions.   POSSIBLE SIDE EFFECTS FROM CYTOTEC . Nausea  Vomiting . Diarrhea Fever . Chills  Hot Flashes Side effects  from the process of the early pregnancy loss include: . Cramping  Bleeding . Headaches  Dizziness RISKS: This is a low risk procedure. Less than 1 in 100 women has a complication. An incomplete passage of the early pregnancy may occur. Also, hemorrhage (heavy bleeding) could happen.  Rarely the pregnancy will not be passed completely. Excessively heavy bleeding may occur.  Your doctor may need to perform surgery to empty the uterus (D&E). Afterwards: Everybody will feel differently after the early pregnancy loss completion. You may have soreness or cramps for a day or two. You may have soreness or cramps for day or two.  You may have light   bleeding for up to 2 weeks. You may be as active as you feel like being. If you have any of the following problems you may call [facility] at [phone] or [facility] at [phone] if it is after hours. . If you have pain that does not get better with pain medication . Bleeding that soaks through 2 thick full-sized sanitary pads in an hour . Cramps that last longer than 2 days . Foul smelling discharge . Fever above 100.4 degrees F Even if you do not have any of these symptoms, you should have a follow-up exam to make sure you are healing properly. Your next normal period will  usually start again in 4-6 week after the loss. You can get pregnant soon after the loss, so use birth control right away. Finally: Make sure all your questions are answered before during and after any procedure. Follow up with medical care and family planning methods.      

## 2017-08-09 NOTE — Progress Notes (Signed)
US 6+2 wks fetal pole,no fetal heart tones,enlarged YS,normal ovaries bilat,GS 24.0 mm,Kim discussed results w/pt

## 2017-08-10 ENCOUNTER — Telehealth: Payer: Self-pay | Admitting: *Deleted

## 2017-08-10 LAB — CBC
HEMOGLOBIN: 12.5 g/dL (ref 11.1–15.9)
Hematocrit: 36.9 % (ref 34.0–46.6)
MCH: 28.7 pg (ref 26.6–33.0)
MCHC: 33.9 g/dL (ref 31.5–35.7)
MCV: 85 fL (ref 79–97)
Platelets: 367 10*3/uL (ref 150–379)
RBC: 4.35 x10E6/uL (ref 3.77–5.28)
RDW: 13.1 % (ref 12.3–15.4)
WBC: 5.4 10*3/uL (ref 3.4–10.8)

## 2017-08-10 LAB — ABO/RH: Rh Factor: POSITIVE

## 2017-08-10 LAB — BETA HCG QUANT (REF LAB): HCG QUANT: 38236 m[IU]/mL

## 2017-08-10 NOTE — Telephone Encounter (Signed)
Catherine Haney Interpreter used for phone encounter [phone]  Patient states she took first dose of cytotec yesterday and has only has dark bleeding like at the end of a period and severe cramping. She is hesitate to take the second does because of the cramping. States she wants to just do the surgery. She has tried Tylenol for the cramping but it is not helping. Could she get something stronger like Ultram before not doing the second dose? Please advise.

## 2017-08-11 ENCOUNTER — Telehealth: Payer: Self-pay | Admitting: *Deleted

## 2017-08-11 MED ORDER — HYDROCODONE-ACETAMINOPHEN 5-325 MG PO TABS: 1.0000 | ORAL_TABLET | Freq: Four times a day (QID) | ORAL | 0 refills | Status: DC | PRN

## 2017-08-11 NOTE — Telephone Encounter (Signed)
Patient informed hydrocodone was sent to pharmacy. Verbalized understanding.

## 2017-08-11 NOTE — Addendum Note (Signed)
Addended by: Shawna ClampBOOKER, KIMBERLY R on: 08/11/2017 11:11 AM   Modules accepted: Orders

## 2017-08-17 ENCOUNTER — Encounter: Payer: Self-pay | Admitting: Women's Health

## 2017-08-17 ENCOUNTER — Ambulatory Visit (INDEPENDENT_AMBULATORY_CARE_PROVIDER_SITE_OTHER): Payer: BLUE CROSS/BLUE SHIELD | Admitting: Women's Health

## 2017-08-17 VITALS — BP 120/80 | HR 99 | Ht 60.0 in | Wt 123.0 lb

## 2017-08-17 DIAGNOSIS — Z3A01 Less than 8 weeks gestation of pregnancy: Secondary | ICD-10-CM

## 2017-08-17 DIAGNOSIS — O039 Complete or unspecified spontaneous abortion without complication: Secondary | ICD-10-CM | POA: Diagnosis not present

## 2017-08-17 NOTE — Progress Notes (Signed)
   GYN VISIT Patient name: Catherine Haney MRN [id number]  Date of birth: 06/22/1990 Chief Complaint:   Follow-up (miscarriage)  History of Present Illness:   Catherine Haney is a 27 y.o. [phone] female being seen today for f/u missed Ab.  She was seen 08/09/17 and missed Ab dx. U/S that day revealed CRL c/w 4457w2d, GS c/w 5432w3d and no FCA. Previous u/s on 3/11 CRL c/w 3557w2d and had FHR 86.   She reports she took cytotec 800mcg pv on Friday 3/22 and started bleeding/cramping on Sunday. Bleeding was heavier than a period, does not feel like it is has lightened at all. Did not see a sac pass. Still cramping.   No LMP recorded. Review of Systems:   Pertinent items are noted in HPI Denies fever/chills, dizziness, headaches, visual disturbances, fatigue, shortness of breath, chest pain, abdominal pain, vomiting, abnormal vaginal discharge/itching/odor/irritation, problems with periods, bowel movements, urination, or intercourse unless otherwise stated above.  Pertinent History Reviewed:  Reviewed past medical,surgical, social, obstetrical and family history.  Reviewed problem list, medications and allergies. Physical Assessment:   Vitals:   08/17/17 0856  BP: 120/80  Pulse: 99  Weight: 123 lb (55.8 kg)  Height: 5' (1.524 m)  Body mass index is 24.02 kg/m.       Physical Examination:   General appearance: alert, well appearing, and in no distress  Mental status: alert, oriented to person, place, and time  Skin: warm & dry   Cardiovascular: normal heart rate noted  Respiratory: normal respiratory effort, no distress  Abdomen: soft, non-tender   Pelvic: by Tonia GhentKatie Woods, SNP VULVA: normal appearing vulva with no masses, tenderness or lesions, VAGINA: normal appearing vagina with normal color and discharge, no lesions, CERVIX: normal appearing closed cervix with small amt mucousy bloody discharge on cervix, no lesions  Extremities: no edema   No results found for this or any previous visit (from the  past 24 hour(s)).  Assessment & Plan:  1) F/U missed Ab> s/p cytotec 800mcg x 1 on 3/22, not sure if everything has passed as pt reports continued heavy bleeding/cramping- although scant amt blood noted on exam, will repeat HCG today and discuss w/ MD. Rh+  Meds: No orders of the defined types were placed in this encounter.   Orders Placed This Encounter  Procedures  . Beta hCG quant (ref lab)    Return in about 1 week (around 08/24/2017) for F/U.  Cheral MarkerKimberly R Tayja Manzer CNM, [facility] 08/17/2017 10:02 AM

## 2017-08-17 NOTE — Patient Instructions (Signed)
FACTS YOU SHOULD KNOW  About Early Pregnancy Loss  WHAT IS AN EARLY PREGNANCY LOSS? Once the egg is fertilized with the sperm and begins to develop, it attaches to the lining of the uterus. This early pregnancy tissue may not develop into an embryo (the beginning stage of a baby). Sometimes an embryo does develop but does not continue to grow. These problems can be seen on ultrasound.   MANAGEMNT OF EARLY PREGNANCY LOSS: About 4 out of 100 (0.25%) women will have a pregnancy loss in her lifetime.  One in five pregnancies is found to be an early pregnancy loss.  There are 3 ways to care for an early pregnancy loss:   (1) Surgery, (2) Medicine, (3) Waiting for you to pass the pregnancy on your own. The decision as to how to proceed after being diagnosed with and early pregnancy loss is an individual one.  The decision can be made only after appropriate counseling.  You need to weigh the pros and cons of the 3 choices. Then you can make the choice that works for you.  SURGERY (D&E) . Procedure over in 1 day . Requires being put to sleep . Bleeding may be light . Possible problems during surgery, including injury to womb(uterus) . Care provider has more control Medicine (CYTOTEC) . The complete procedure may take days to weeks . No Surgery . Bleeding may be heavy at times . There may be drug side effects . Patient has more control Waiting . You may choose to wait, in which case your own body may complete the passing of the abnormal early pregnancy on its own in about 2-4 weeks . Your bleeding may be heavy at times . There is a small possibility that you may need surgery if the bleeding is too much or not all of the pregnancy has passed.  CYTOTEC MANAGEMENT Prostaglandins (cytotec) are the most widely used drug for this purpose. They cause the uterus to cramp and contract. You will place the medicine yourself inside your vagina in the privacy of your home. Empting of the uterus should occur  within 3 days but the process may continue for several weeks. The bleeding may seem heavy at times.  INSTRUCTIONS: Take all 4 tablets of cytotec (800mcg total) at one time. This will cause a lot of cramping, you may have bleeding, and pass tissue, then the cramping and bleeding should get better. If you do not pass the tissue, then you can take 4 more tablets of cytotec (800mcg total) 48 hours after your first dose.  You will come back to have your blood drawn to make sure the pregnancy hormones are dropping in 1 week. Please call us if you have any questions.   POSSIBLE SIDE EFFECTS FROM CYTOTEC . Nausea  Vomiting . Diarrhea Fever . Chills  Hot Flashes Side effects  from the process of the early pregnancy loss include: . Cramping  Bleeding . Headaches  Dizziness RISKS: This is a low risk procedure. Less than 1 in 100 women has a complication. An incomplete passage of the early pregnancy may occur. Also, hemorrhage (heavy bleeding) could happen.  Rarely the pregnancy will not be passed completely. Excessively heavy bleeding may occur.  Your doctor may need to perform surgery to empty the uterus (D&E). Afterwards: Everybody will feel differently after the early pregnancy loss completion. You may have soreness or cramps for a day or two. You may have soreness or cramps for day or two.  You may have light   bleeding for up to 2 weeks. You may be as active as you feel like being. If you have any of the following problems you may call [facility] at [phone] or [facility] at [phone] if it is after hours. . If you have pain that does not get better with pain medication . Bleeding that soaks through 2 thick full-sized sanitary pads in an hour . Cramps that last longer than 2 days . Foul smelling discharge . Fever above 100.4 degrees F Even if you do not have any of these symptoms, you should have a follow-up exam to make sure you are healing properly. Your next normal period will  usually start again in 4-6 week after the loss. You can get pregnant soon after the loss, so use birth control right away. Finally: Make sure all your questions are answered before during and after any procedure. Follow up with medical care and family planning methods.      

## 2017-08-18 LAB — BETA HCG QUANT (REF LAB): hCG Quant: 26030 m[IU]/mL

## 2017-08-21 ENCOUNTER — Telehealth: Payer: Self-pay | Admitting: Obstetrics & Gynecology

## 2017-08-21 ENCOUNTER — Encounter: Payer: Self-pay | Admitting: Family

## 2017-08-21 ENCOUNTER — Ambulatory Visit (INDEPENDENT_AMBULATORY_CARE_PROVIDER_SITE_OTHER): Payer: BLUE CROSS/BLUE SHIELD | Admitting: Family

## 2017-08-21 VITALS — BP 107/65 | HR 81 | Temp 97.2°F | Ht 60.0 in | Wt 120.2 lb

## 2017-08-21 DIAGNOSIS — J069 Acute upper respiratory infection, unspecified: Secondary | ICD-10-CM | POA: Diagnosis not present

## 2017-08-21 DIAGNOSIS — J029 Acute pharyngitis, unspecified: Secondary | ICD-10-CM

## 2017-08-21 LAB — RAPID STREP SCREEN (MED CTR MEBANE ONLY): Strep Gp A Ag, IA W/Reflex: NEGATIVE

## 2017-08-21 LAB — CULTURE, GROUP A STREP

## 2017-08-21 MED ORDER — FLUTICASONE PROPIONATE 50 MCG/ACT NA SUSP: 2.0000 | Freq: Every day | NASAL | 6 refills | Status: DC

## 2017-08-21 MED ORDER — CETIRIZINE HCL 10 MG PO TABS: 10.0000 mg | ORAL_TABLET | Freq: Every day | ORAL | 11 refills | Status: DC

## 2017-08-21 NOTE — Patient Instructions (Signed)
Upper Respiratory Infection, Adult Most upper respiratory infections (URIs) are caused by a virus. A URI affects the nose, throat, and upper air passages. The most common type of URI is often called "the common cold." Follow these instructions at home:  Take medicines only as told by your doctor.  Gargle warm saltwater or take cough drops to comfort your throat as told by your doctor.  Use a warm mist humidifier or inhale steam from a shower to increase air moisture. This may make it easier to breathe.  Drink enough fluid to keep your pee (urine) clear or pale yellow.  Eat soups and other clear broths.  Have a healthy diet.  Rest as needed.  Go back to work when your fever is gone or your doctor says it is okay. ? You may need to stay home longer to avoid giving your URI to others. ? You can also wear a face mask and wash your hands often to prevent spread of the virus.  Use your inhaler more if you have asthma.  Do not use any tobacco products, including cigarettes, chewing tobacco, or electronic cigarettes. If you need help quitting, ask your doctor. Contact a doctor if:  You are getting worse, not better.  Your symptoms are not helped by medicine.  You have chills.  You are getting more short of breath.  You have brown or red mucus.  You have yellow or brown discharge from your nose.  You have pain in your face, especially when you bend forward.  You have a fever.  You have puffy (swollen) neck glands.  You have pain while swallowing.  You have white areas in the back of your throat. Get help right away if:  You have very bad or constant: ? Headache. ? Ear pain. ? Pain in your forehead, behind your eyes, and over your cheekbones (sinus pain). ? Chest pain.  You have long-lasting (chronic) lung disease and any of the following: ? Wheezing. ? Long-lasting cough. ? Coughing up blood. ? A change in your usual mucus.  You have a stiff neck.  You have  changes in your: ? Vision. ? Hearing. ? Thinking. ? Mood. This information is not intended to replace advice given to you by your health care provider. Make sure you discuss any questions you have with your health care provider. Document Released: 10/26/2007 Document Revised: 01/10/2016 Document Reviewed: 08/14/2013 Elsevier Interactive Patient Education  2018 Elsevier Inc.  

## 2017-08-21 NOTE — Telephone Encounter (Signed)
Patient called stating Selena BattenKim was to call her today.  Spoke to Sprint Nextel CorporationKim who advised patient repeat Cytotec dose.  Patient states she does not feel well and does not want to take the medication again.  Advised patient would then need to make an appointment with one of the md's to discuss a D&C.  Verbalized understanding and will get patient in tomorrow with Dr Despina HiddenEure. [place] interpreter used for conversation.

## 2017-08-21 NOTE — Progress Notes (Signed)
   Subjective:    Patient ID: Catherine Haney, female    DOB: 17-Jun-1990, 27 y.o.   MRN: [id number]  Sore Throat   This is a new problem. The current episode started in the past 7 days. The problem has been gradually worsening. There has been no fever. The pain is at a severity of 10/10. The pain is mild. Associated symptoms include congestion, ear pain, headaches, a hoarse voice, swollen glands and trouble swallowing. Pertinent negatives include no coughing or ear discharge. She has tried acetaminophen and NSAIDs for the symptoms. The treatment provided mild relief.      Review of Systems  HENT: Positive for congestion, ear pain, hoarse voice and trouble swallowing. Negative for ear discharge.   Respiratory: Negative for cough.   Neurological: Positive for headaches.  All other systems reviewed and are negative.      Objective:   Physical Exam  Constitutional: She is oriented to person, place, and time. She appears well-developed and well-nourished. No distress.  HENT:  Head: Normocephalic and atraumatic.  Right Ear: External ear normal.  Left Ear: External ear normal.  Nose: Mucosal edema and rhinorrhea present.  Mouth/Throat: Posterior oropharyngeal erythema present.  Eyes: Pupils are equal, round, and reactive to light.  Neck: Normal range of motion. Neck supple. No thyromegaly present.  Cardiovascular: Normal rate, regular rhythm, normal heart sounds and intact distal pulses.  No murmur heard. Pulmonary/Chest: Effort normal and breath sounds normal. No respiratory distress. She has no wheezes.  Abdominal: Soft. Bowel sounds are normal. She exhibits no distension. There is no tenderness.  Musculoskeletal: Normal range of motion. She exhibits no edema or tenderness.  Neurological: She is alert and oriented to person, place, and time.  Skin: Skin is warm and dry.  Psychiatric: She has a normal mood and affect. Her behavior is normal. Judgment and thought content normal.  Vitals  reviewed.     BP 107/65   Pulse 81   Temp (!) 97.2 F (36.2 C) (Oral)   Ht 5' (1.524 m)   Wt 120 lb 3.2 oz (54.5 kg)   BMI 23.47 kg/m      Assessment & Plan:  1. Sore throat - Rapid St[facility] Screen ([facility])  2. Viral upper respiratory infection - Take meds as prescribed - Use a cool mist humidifier  -Use saline nose sprays frequently -Force fluids -For any cough or congestion  Use plain Mucinex- regular strength or max strength is fine -For fever or aces or pains- take tylenol or ibuprofen. -Throat lozenges if help - fluticasone (FLONASE) 50 MCG/ACT nasal spray; Place 2 sprays into both nostrils daily.  Dispense: 16 g; Refill: 6 - cetirizine (ZYRTEC) 10 MG tablet; Take 1 tablet (10 mg total) by mouth daily.  Dispense: 30 tablet; Refill: 11    Jannifer Rodneyhristy Hawks, FNP

## 2017-08-22 ENCOUNTER — Other Ambulatory Visit (INDEPENDENT_AMBULATORY_CARE_PROVIDER_SITE_OTHER): Payer: BLUE CROSS/BLUE SHIELD

## 2017-08-22 ENCOUNTER — Encounter: Payer: Self-pay | Admitting: Obstetrics & Gynecology

## 2017-08-22 ENCOUNTER — Ambulatory Visit (INDEPENDENT_AMBULATORY_CARE_PROVIDER_SITE_OTHER): Payer: BLUE CROSS/BLUE SHIELD | Admitting: Obstetrics & Gynecology

## 2017-08-22 VITALS — BP 114/60 | HR 78 | Ht 60.0 in | Wt 121.0 lb

## 2017-08-22 DIAGNOSIS — Z3A01 Less than 8 weeks gestation of pregnancy: Secondary | ICD-10-CM | POA: Diagnosis not present

## 2017-08-22 DIAGNOSIS — O021 Missed abortion: Secondary | ICD-10-CM

## 2017-08-22 NOTE — Progress Notes (Signed)
Follow up appointment for results  Chief Complaint  Patient presents with  . Follow-up    to discuss D&C    Blood pressure 114/60, pulse 78, height 5' (1.524 m), weight 121 lb (54.9 kg).   Irregular gestational sac with CRL 2.5 mm ---> 7357w5d with no FHR, enlarged yolk sac, missed ab despite 2 doses of cytotec  MEDS ordered this encounter: No orders of the defined types were placed in this encounter.   Orders for this encounter: Orders Placed This Encounter  Procedures  . US OB Transvaginal    Impression: 1. Missed abortion  - US OB Transvaginal; Future   Plan: Suction D&C scheduled for 08/25/2017 @ 0730  Follow Up: Return in about 2 weeks (around 09/07/2017) for Post Op, with Dr Despina HiddenEure.       Face to face time:  15 minutes  Greater than 50% of the visit time was spent in counseling and coordination of care with the patient.  The summary and outline of the counseling and care coordination is summarized in the note above.   All questions were answered.        Past Medical History:  Diagnosis Date  . GERD (gastroesophageal reflux disease)     History reviewed. No pertinent surgical history.  OB History    Gravida  3   Para  2   Term  2   Preterm      AB  1   Living  2     SAB  1   TAB      Ectopic      Multiple      Live Births  2           No Known Allergies  Social History   Socioeconomic History  . Marital status: Married    Spouse name: Not on file  . Number of children: Not on file  . Years of education: Not on file  . Highest education level: Not on file  Occupational History  . Not on file  Social Needs  . Financial resource strain: Not on file  . Food insecurity:    Worry: Not on file    Inability: Not on file  . Transportation needs:    Medical: Not on file    Non-medical: Not on file  Tobacco Use  . Smoking status: Never Smoker  . Smokeless tobacco: Never Used  Substance and Sexual Activity  . Alcohol  use: No    Alcohol/week: 0.0 oz  . Drug use: No  . Sexual activity: Not Currently    Birth control/protection: None  Lifestyle  . Physical activity:    Days per week: Not on file    Minutes per session: Not on file  . Stress: Not on file  Relationships  . Social connections:    Talks on phone: Not on file    Gets together: Not on file    Attends religious service: Not on file    Active member of club or organization: Not on file    Attends meetings of clubs or organizations: Not on file    Relationship status: Not on file  Other Topics Concern  . Not on file  Social History Narrative  . Not on file    Family History  Problem Relation Age of Onset  . Hypertension Mother   . Diabetes Father

## 2017-08-22 NOTE — Progress Notes (Signed)
US 5+5 wks IUP,no fetal heart tones,enlarged YS, irregular GS 20.9 mm,normal ovaries bilat

## 2017-08-23 ENCOUNTER — Encounter (HOSPITAL_COMMUNITY): Payer: Self-pay

## 2017-08-23 ENCOUNTER — Encounter (HOSPITAL_COMMUNITY)
Admission: RE | Admit: 2017-08-23 | Discharge: 2017-08-23 | Disposition: A | Discharge status: Home / Self Care | Payer: BLUE CROSS/BLUE SHIELD | Source: Ambulatory Visit | Attending: Obstetrics & Gynecology | Admitting: Obstetrics & Gynecology

## 2017-08-23 ENCOUNTER — Other Ambulatory Visit: Payer: Self-pay | Admitting: Obstetrics & Gynecology

## 2017-08-23 NOTE — Patient Instructions (Signed)
Catherine Haney  08/23/2017     @PREFPERIOPPHARMACY @   Your procedure is scheduled on  08/25/2017   Report to [facility] at  615  A.M.  Call this number if you have problems the morning of surgery:  [phone]   Remember:  Do not eat food or drink liquids after midnight.  Take these medicines the morning of surgery with A SIP OF WATER  none   Do not wear jewelry, make-up or nail polish.  Do not wear lotions, powders, or perfumes, or deodorant.  Do not shave 48 hours prior to surgery.  Men may shave face and neck.  Do not bring valuables to the hospital.  [facility] is not responsible for any belongings or valuables.  Contacts, dentures or bridgework may not be worn into surgery.  Leave your suitcase in the car.  After surgery it may be brought to your room.  For patients admitted to the hospital, discharge time will be determined by your treatment team.  Patients discharged the day of surgery will not be allowed to drive home.   Name and phone number of your driver:   family Special instructions:  None  Please read over the following fact sheets that you were given. Anesthesia Post-op Instructions and Care and Recovery After Surgery       Dilation and Curettage or Vacuum Curettage Dilation and curettage (D&C) and vacuum curettage are minor procedures. A D&C involves stretching (dilation) the cervix and scraping (curettage) the inside lining of the uterus (endometrium). During a D&C, tissue is gently scraped from the endometrium, starting from the top portion of the uterus down to the lowest part of the uterus (cervix). During a vacuum curettage, the lining and tissue in the uterus are removed with the use of gentle suction. Curettage may be performed to either diagnose or treat a problem. As a diagnostic procedure, curettage is performed to examine tissues from the uterus. A diagnostic curettage may be done if you have:  Irregular bleeding in the  uterus.  Bleeding with the development of clots.  Spotting between menstrual periods.  Prolonged menstrual periods or other abnormal bleeding.  Bleeding after menopause.  No menstrual period (amenorrhea).  A change in size and shape of the uterus.  Abnormal endometrial cells discovered during a Pap test.  As a treatment procedure, curettage may be performed for the following reasons:  Removal of an IUD (intrauterine device).  Removal of retained placenta after giving birth.  Abortion.  Miscarriage.  Removal of endometrial polyps.  Removal of uncommon types of noncancerous lumps (fibroids).  Tell a health care provider about:  Any allergies you have, including allergies to prescribed medicine or latex.  All medicines you are taking, including vitamins, herbs, eye drops, creams, and over-the-counter medicines. This is especially important if you take any blood-thinning medicine. Bring a list of all of your medicines to your appointment.  Any problems you or family members have had with anesthetic medicines.  Any blood disorders you have.  Any surgeries you have had.  Your medical history and any medical conditions you have.  Whether you are pregnant or may be pregnant.  Recent vaginal infections you have had.  Recent menstrual periods, bleeding problems you have had, and what form of birth control (contraception) you use. What are the risks? Generally, this is a safe procedure. However, problems may occur, including:  Infection.  Heavy vaginal bleeding.  Allergic reactions to medicines.  Damage to the cervix or other structures or organs.  Development of scar tissue (adhesions) inside the uterus, which can cause abnormal amounts of menstrual bleeding. This may make it harder to get pregnant in the future.  A hole (perforation) or puncture in the uterine wall. This is rare.  What happens before the procedure? Staying hydrated Follow instructions from  your health care provider about hydration, which may include:  Up to 2 hours before the procedure - you may continue to drink clear liquids, such as water, clear fruit juice, black coffee, and plain tea.  Eating and drinking restrictions Follow instructions from your health care provider about eating and drinking, which may include:  8 hours before the procedure - stop eating heavy meals or foods such as meat, fried foods, or fatty foods.  6 hours before the procedure - stop eating light meals or foods, such as toast or cereal.  6 hours before the procedure - stop drinking milk or drinks that contain milk.  2 hours before the procedure - stop drinking clear liquids. If your health care provider told you to take your medicine(s) on the day of your procedure, take them with only a sip of water.  Medicines  Ask your health care provider about: ? Changing or stopping your regular medicines. This is especially important if you are taking diabetes medicines or blood thinners. ? Taking medicines such as aspirin and ibuprofen. These medicines can thin your blood. Do not take these medicines before your procedure if your health care provider instructs you not to.  You may be given antibiotic medicine to help prevent infection. General instructions  For 24 hours before your procedure, do not: ? Douche. ? Use tampons. ? Use medicines, creams, or suppositories in the vagina. ? Have sexual intercourse.  You may be given a pregnancy test on the day of the procedure.  Plan to have someone take you home from the hospital or clinic.  You may have a blood or urine sample taken.  If you will be going home right after the procedure, plan to have someone with you for 24 hours. What happens during the procedure?  To reduce your risk of infection: ? Your health care team will wash or sanitize their hands. ? Your skin will be washed with soap.  An IV tube will be inserted into one of your  veins.  You will be given one of the following: ? A medicine that numbs the area in and around the cervix (local anesthetic). ? A medicine to make you fall asleep (general anesthetic).  You will lie down on your back, with your feet in foot rests (stirrups).  The size and position of your uterus will be checked.  A lubricated instrument (speculum or Sims retractor) will be inserted into the back side of your vagina. The speculum will be used to hold apart the walls of your vagina so your health care provider can see your cervix.  A tool (tenaculum) will be attached to the lip of the cervix to stabilize it.  Your cervix will be softened and dilated. This may be done by: ? Taking a medicine. ? Having tapered dilators or thin rods (laminaria) or gradual widening instruments (tapered dilators) inserted into your cervix.  A small, sharp, curved instrument (curette) will be used to scrape a small amount of tissue or cells from the endometrium or cervical canal. In some cases, gentle suction is applied with the curette. The curette will then be removed. The  cells will be taken to a lab for testing. The procedure may vary among health care providers and hospitals. What happens after the procedure?  You may have mild cramping, backache, pain, and light bleeding or spotting. You may pass small blood clots from your vagina.  You may have to wear compression stockings. These stockings help to prevent blood clots and reduce swelling in your legs.  Your blood pressure, heart rate, breathing rate, and blood oxygen level will be monitored until the medicines you were given have worn off. Summary  Dilation and curettage (D&C) involves stretching (dilation) the cervix and scraping (curettage) the inside lining of the uterus (endometrium).  After the procedure, you may have mild cramping, backache, pain, and light bleeding or spotting. You may pass small blood clots from your vagina.  Plan to have  someone take you home from the hospital or clinic. This information is not intended to replace advice given to you by your health care provider. Make sure you discuss any questions you have with your health care provider. Document Released: 05/09/2005 Document Revised: 01/24/2016 Document Reviewed: 01/24/2016 Elsevier Interactive Patient Education  2018 [employer].  Dilation and Curettage or Vacuum Curettage, Care After These instructions give you information about caring for yourself after your procedure. Your doctor may also give you more specific instructions. Call your doctor if you have any problems or questions after your procedure. Follow these instructions at home: Activity  Do not drive or use heavy machinery while taking prescription pain medicine.  For 24 hours after your procedure, avoid driving.  Take short walks often, followed by rest periods. Ask your doctor what activities are safe for you. After one or two days, you may be able to return to your normal activities.  Do not lift anything that is heavier than 10 lb (4.5 kg) until your doctor approves.  For at least 2 weeks, or as long as told by your doctor: ? Do not douche. ? Do not use tampons. ? Do not have sex. General instructions  Take over-the-counter and prescription medicines only as told by your doctor. This is very important if you take blood thinning medicine.  Do not take baths, swim, or use a hot tub until your doctor approves. Take showers instead of baths.  Wear compression stockings as told by your doctor.  It is up to you to get the results of your procedure. Ask your doctor when your results will be ready.  Keep all follow-up visits as told by your doctor. This is important. Contact a doctor if:  You have very bad cramps that get worse or do not get better with medicine.  You have very bad pain in your belly (abdomen).  You cannot drink fluids without throwing up (vomiting).  You get pain  in a different part of the area between your belly and thighs (pelvis).  You have bad-smelling discharge from your vagina.  You have a rash. Get help right away if:  You are bleeding a lot from your vagina. A lot of bleeding means soaking more than one sanitary pad in an hour, for 2 hours in a row.  You have clumps of blood (blood clots) coming from your vagina.  You have a fever or chills.  Your belly feels very tender or hard.  You have chest pain.  You have trouble breathing.  You cough up blood.  You feel dizzy.  You feel light-headed.  You pass out (faint).  You have pain in your neck  or shoulder area. Summary  Take short walks often, followed by rest periods. Ask your doctor what activities are safe for you. After one or two days, you may be able to return to your normal activities.  Do not lift anything that is heavier than 10 lb (4.5 kg) until your doctor approves.  Do not take baths, swim, or use a hot tub until your doctor approves. Take showers instead of baths.  Contact your doctor if you have any symptoms of infection, like bad-smelling discharge from your vagina. This information is not intended to replace advice given to you by your health care provider. Make sure you discuss any questions you have with your health care provider. Document Released: 02/16/2008 Document Revised: 01/25/2016 Document Reviewed: 01/25/2016 Elsevier Interactive Patient Education  2017 Else[employer] Inc.  General Anesthesia, Adult General anesthesia is the use of medicines to make a person "go to sleep" (be unconscious) for a medical procedure. General anesthesia is often recommended when a procedure:  Is long.  Requires you to be still or in an unusual position.  Is major and can cause you to lose blood.  Is impossible to do without general anesthesia.  The medicines used for general anesthesia are called general anesthetics. In addition to making you sleep, the  medicines:  Prevent pain.  Control your blood pressure.  Relax your muscles.  Tell a health care provider about:  Any allergies you have.  All medicines you are taking, including vitamins, herbs, eye drops, creams, and over-the-counter medicines.  Any problems you or family members have had with anesthetic medicines.  Types of anesthetics you have had in the past.  Any bleeding disorders you have.  Any surgeries you have had.  Any medical conditions you have.  Any history of heart or lung conditions, such as heart failure, sleep apnea, or chronic obstructive pulmonary disease (COPD).  Whether you are pregnant or may be pregnant.  Whether you use tobacco, alcohol, marijuana, or street drugs.  Any history of Financial plannermilitary service.  Any history of depression or anxiety. What are the risks? Generally, this is a safe procedure. However, problems may occur, including:  Allergic reaction to anesthetics.  Lung and heart problems.  Inhaling food or liquids from your stomach into your lungs (aspiration).  Injury to nerves.  Waking up during your procedure and being unable to move (rare).  Extreme agitation or a state of mental confusion (delirium) when you wake up from the anesthetic.  Air in the bloodstream, which can lead to stroke.  These problems are more likely to develop if you are having a major surgery or if you have an advanced medical condition. You can prevent some of these complications by answering all of your health care provider's questions thoroughly and by following all pre-procedure instructions. General anesthesia can cause side effects, including:  Nausea or vomiting  A sore throat from the breathing tube.  Feeling cold or shivery.  Feeling tired, washed out, or achy.  Sleepiness or drowsiness.  Confusion or agitation.  What happens before the procedure? Staying hydrated Follow instructions from your health care provider about hydration, which  may include:  Up to 2 hours before the procedure - you may continue to drink clear liquids, such as water, clear fruit juice, black coffee, and plain tea.  Eating and drinking restrictions Follow instructions from your health care provider about eating and drinking, which may include:  8 hours before the procedure - stop eating heavy meals or foods such as meat,  fried foods, or fatty foods.  6 hours before the procedure - stop eating light meals or foods, such as toast or cereal.  6 hours before the procedure - stop drinking milk or drinks that contain milk.  2 hours before the procedure - stop drinking clear liquids.  Medicines  Ask your health care provider about: ? Changing or stopping your regular medicines. This is especially important if you are taking diabetes medicines or blood thinners. ? Taking medicines such as aspirin and ibuprofen. These medicines can thin your blood. Do not take these medicines before your procedure if your health care provider instructs you not to. ? Taking new dietary supplements or medicines. Do not take these during the week before your procedure unless your health care provider approves them.  If you are told to take a medicine or to continue taking a medicine on the day of the procedure, take the medicine with sips of water. General instructions   Ask if you will be going home the same day, the following day, or after a longer hospital stay. ? Plan to have someone take you home. ? Plan to have someone stay with you for the first 24 hours after you leave the hospital or clinic.  For 3-6 weeks before the procedure, try not to use any tobacco products, such as cigarettes, chewing tobacco, and e-cigarettes.  You may brush your teeth on the morning of the procedure, but make sure to spit out the toothpaste. What happens during the procedure?  You will be given anesthetics through a mask and through an IV tube in one of your veins.  You may receive  medicine to help you relax (sedative).  As soon as you are asleep, a breathing tube may be used to help you breathe.  An anesthesia specialist will stay with you throughout the procedure. He or she will help keep you comfortable and safe by continuing to give you medicines and adjusting the amount of medicine that you get. He or she will also watch your blood pressure, pulse, and oxygen levels to make sure that the anesthetics do not cause any problems.  If a breathing tube was used to help you breathe, it will be removed before you wake up. The procedure may vary among health care providers and hospitals. What happens after the procedure?  You will wake up, often slowly, after the procedure is complete, usually in a recovery area.  Your blood pressure, heart rate, breathing rate, and blood oxygen level will be monitored until the medicines you were given have worn off.  You may be given medicine to help you calm down if you feel anxious or agitated.  If you will be going home the same day, your health care provider may check to make sure you can stand, drink, and urinate.  Your health care providers will treat your pain and side effects before you go home.  Do not drive for 24 hours if you received a sedative.  You may: ? Feel nauseous and vomit. ? Have a sore throat. ? Have mental slowness. ? Feel cold or shivery. ? Feel sleepy. ? Feel tired. ? Feel sore or achy, even in parts of your body where you did not have surgery. This information is not intended to replace advice given to you by your health care provider. Make sure you discuss any questions you have with your health care provider. Document Released: 08/16/2007 Document Revised: 10/20/2015 Document Reviewed: 04/23/2015 Elsevier Interactive Patient Education  2018 Elsevier  Inc. General Anesthesia, Adult, Care After These instructions provide you with information about caring for yourself after your procedure. Your health  care provider may also give you more specific instructions. Your treatment has been planned according to current medical practices, but problems sometimes occur. Call your health care provider if you have any problems or questions after your procedure. What can I expect after the procedure? After the procedure, it is common to have:  Vomiting.  A sore throat.  Mental slowness.  It is common to feel:  Nauseous.  Cold or shivery.  Sleepy.  Tired.  Sore or achy, even in parts of your body where you did not have surgery.  Follow these instructions at home: For at least 24 hours after the procedure:  Do not: ? Participate in activities where you could fall or become injured. ? Drive. ? Use heavy machinery. ? Drink alcohol. ? Take sleeping pills or medicines that cause drowsiness. ? Make important decisions or sign legal documents. ? Take care of children on your own.  Rest. Eating and drinking  If you vomit, drink water, juice, or soup when you can drink without vomiting.  Drink enough fluid to keep your urine clear or pale yellow.  Make sure you have little or no nausea before eating solid foods.  Follow the diet recommended by your health care provider. General instructions  Have a responsible adult stay with you until you are awake and alert.  Return to your normal activities as told by your health care provider. Ask your health care provider what activities are safe for you.  Take over-the-counter and prescription medicines only as told by your health care provider.  If you smoke, do not smoke without supervision.  Keep all follow-up visits as told by your health care provider. This is important. Contact a health care provider if:  You continue to have nausea or vomiting at home, and medicines are not helpful.  You cannot drink fluids or start eating again.  You cannot urinate after 8-12 hours.  You develop a skin rash.  You have fever.  You have  increasing redness at the site of your procedure. Get help right away if:  You have difficulty breathing.  You have chest pain.  You have unexpected bleeding.  You feel that you are having a life-threatening or urgent problem. This information is not intended to replace advice given to you by your health care provider. Make sure you discuss any questions you have with your health care provider. Document Released: 08/15/2000 Document Revised: 10/12/2015 Document Reviewed: 04/23/2015 Elsevier Interactive Patient Education  [employer]2018 Elsevier Inc.

## 2017-08-24 ENCOUNTER — Ambulatory Visit: Payer: BLUE CROSS/BLUE SHIELD | Admitting: Women's Health

## 2017-08-25 ENCOUNTER — Encounter (HOSPITAL_COMMUNITY): Payer: Self-pay | Admitting: *Deleted

## 2017-08-25 ENCOUNTER — Ambulatory Visit (HOSPITAL_COMMUNITY): Payer: BLUE CROSS/BLUE SHIELD | Admitting: Anesthesiology

## 2017-08-25 ENCOUNTER — Ambulatory Visit (HOSPITAL_COMMUNITY)
Admission: RE | Admit: 2017-08-25 | Discharge: 2017-08-25 | Disposition: A | Discharge status: Home / Self Care | Payer: BLUE CROSS/BLUE SHIELD | Source: Ambulatory Visit | Attending: Obstetrics & Gynecology | Admitting: Obstetrics & Gynecology

## 2017-08-25 ENCOUNTER — Encounter (HOSPITAL_COMMUNITY)
Admission: RE | Disposition: A | Discharge status: Home / Self Care | Payer: Self-pay | Source: Ambulatory Visit | Attending: Obstetrics & Gynecology

## 2017-08-25 DIAGNOSIS — F411 Generalized anxiety disorder: Secondary | ICD-10-CM | POA: Insufficient documentation

## 2017-08-25 DIAGNOSIS — O021 Missed abortion: Secondary | ICD-10-CM

## 2017-08-25 DIAGNOSIS — K219 Gastro-esophageal reflux disease without esophagitis: Secondary | ICD-10-CM | POA: Diagnosis not present

## 2017-08-25 DIAGNOSIS — E785 Hyperlipidemia, unspecified: Secondary | ICD-10-CM | POA: Insufficient documentation

## 2017-08-25 DIAGNOSIS — E559 Vitamin D deficiency, unspecified: Secondary | ICD-10-CM | POA: Diagnosis not present

## 2017-08-25 HISTORY — PX: DILATION AND CURETTAGE OF UTERUS: SHX78

## 2017-08-25 LAB — COMPREHENSIVE METABOLIC PANEL
ALK PHOS: 71 U/L (ref 38–126)
ALT: 16 U/L (ref 14–54)
ANION GAP: 11 (ref 5–15)
AST: 19 U/L (ref 15–41)
Albumin: 4.2 g/dL (ref 3.5–5.0)
BUN: 8 mg/dL (ref 6–20)
CALCIUM: 9.4 mg/dL (ref 8.9–10.3)
CO2: 24 mmol/L (ref 22–32)
Chloride: 102 mmol/L (ref 101–111)
Creatinine, Ser: 0.52 mg/dL (ref 0.44–1.00)
GFR calc Af Amer: >60 mL/min (ref >60)
GFR calc non Af Amer: >60 mL/min (ref >60)
Glucose, Bld: 101 mg/dL — ABNORMAL HIGH (ref 65–99)
Potassium: 3.7 mmol/L (ref 3.5–5.1)
SODIUM: 137 mmol/L (ref 135–145)
Total Bilirubin: 0.6 mg/dL (ref 0.3–1.2)
Total Protein: 8.1 g/dL (ref 6.5–8.1)

## 2017-08-25 LAB — CBC
HCT: 38.3 % (ref 36.0–46.0)
HEMOGLOBIN: 12.5 g/dL (ref 12.0–15.0)
MCH: 27.7 pg (ref 26.0–34.0)
MCHC: 32.6 g/dL (ref 30.0–36.0)
MCV: 84.9 fL (ref 78.0–100.0)
PLATELETS: 348 10*3/uL (ref 150–400)
RBC: 4.51 MIL/uL (ref 3.87–5.11)
RDW: 12.5 % (ref 11.5–15.5)
WBC: 5.4 10*3/uL (ref 4.0–10.5)

## 2017-08-25 LAB — TYPE AND SCREEN
ABO/RH(D): O POS
Antibody Screen: NEGATIVE

## 2017-08-25 SURGERY — DILATION AND CURETTAGE
Anesthesia: General | Site: Vagina

## 2017-08-25 MED ORDER — LIDOCAINE HCL (PF) 1 % IJ SOLN
INTRAMUSCULAR | Status: AC
  Filled 2017-08-25: qty 25

## 2017-08-25 MED ORDER — FENTANYL CITRATE (PF) 100 MCG/2ML IJ SOLN
INTRAMUSCULAR | Status: AC
  Filled 2017-08-25: qty 2

## 2017-08-25 MED ORDER — 0.9 % SODIUM CHLORIDE (POUR BTL) OPTIME
TOPICAL | Status: DC | PRN
  Administered 2017-08-25: 1000 mL

## 2017-08-25 MED ORDER — LACTATED RINGERS IV SOLN
INTRAVENOUS | Status: DC
  Administered 2017-08-25: 07:00:00 via INTRAVENOUS

## 2017-08-25 MED ORDER — PROPOFOL 10 MG/ML IV BOLUS
INTRAVENOUS | Status: DC | PRN
  Administered 2017-08-25: 130 mg via INTRAVENOUS

## 2017-08-25 MED ORDER — FENTANYL CITRATE (PF) 100 MCG/2ML IJ SOLN
INTRAMUSCULAR | Status: DC | PRN
  Administered 2017-08-25: 25 ug via INTRAVENOUS

## 2017-08-25 MED ORDER — ONDANSETRON HCL 4 MG/2ML IJ SOLN
INTRAMUSCULAR | Status: DC | PRN
  Administered 2017-08-25: 4 mg via INTRAVENOUS

## 2017-08-25 MED ORDER — METHYLERGONOVINE MALEATE 0.2 MG/ML IJ SOLN
INTRAMUSCULAR | Status: AC
  Filled 2017-08-25: qty 1

## 2017-08-25 MED ORDER — MIDAZOLAM HCL 5 MG/5ML IJ SOLN
INTRAMUSCULAR | Status: DC | PRN
  Administered 2017-08-25: 2 mg via INTRAVENOUS

## 2017-08-25 MED ORDER — OXYTOCIN 10 UNIT/ML IJ SOLN
INTRAMUSCULAR | Status: AC
  Filled 2017-08-25: qty 1

## 2017-08-25 MED ORDER — MEPERIDINE HCL 50 MG/ML IJ SOLN: 6.2500 mg | INTRAMUSCULAR | Status: DC | PRN

## 2017-08-25 MED ORDER — HYDROMORPHONE HCL 1 MG/ML IJ SOLN
0.2500 mg | INTRAMUSCULAR | Status: DC | PRN
  Administered 2017-08-25 (×2): 0.5 mg via INTRAVENOUS
  Filled 2017-08-25 (×2): qty 0.5

## 2017-08-25 MED ORDER — CEFAZOLIN SODIUM-DEXTROSE 2-4 GM/100ML-% IV SOLN
2.0000 g | INTRAVENOUS | Status: AC
  Administered 2017-08-25: 2 g via INTRAVENOUS
  Filled 2017-08-25: qty 100

## 2017-08-25 MED ORDER — ONDANSETRON HCL 4 MG/2ML IJ SOLN
4.0000 mg | Freq: Once | INTRAMUSCULAR | Status: AC | PRN
  Administered 2017-08-25: 4 mg via INTRAVENOUS
  Filled 2017-08-25: qty 2

## 2017-08-25 MED ORDER — KETOROLAC TROMETHAMINE 30 MG/ML IJ SOLN
30.0000 mg | Freq: Once | INTRAMUSCULAR | Status: AC
  Administered 2017-08-25: 30 mg via INTRAVENOUS
  Filled 2017-08-25: qty 1

## 2017-08-25 MED ORDER — METHYLERGONOVINE MALEATE 0.2 MG/ML IJ SOLN
INTRAMUSCULAR | Status: DC | PRN
  Administered 2017-08-25: 0.2 mg via INTRAMUSCULAR

## 2017-08-25 MED ORDER — HYDROCODONE-ACETAMINOPHEN 5-325 MG PO TABS: 1.0000 | ORAL_TABLET | Freq: Four times a day (QID) | ORAL | 0 refills | Status: DC | PRN

## 2017-08-25 MED ORDER — LIDOCAINE HCL 1 % IJ SOLN
INTRAMUSCULAR | Status: DC | PRN
  Administered 2017-08-25: 20 mg via INTRADERMAL

## 2017-08-25 MED ORDER — MIDAZOLAM HCL 2 MG/2ML IJ SOLN
INTRAMUSCULAR | Status: AC
  Filled 2017-08-25: qty 2

## 2017-08-25 MED ORDER — HYDROCODONE-ACETAMINOPHEN 7.5-325 MG PO TABS: 1.0000 | ORAL_TABLET | Freq: Once | ORAL | Status: DC | PRN

## 2017-08-25 MED ORDER — KETOROLAC TROMETHAMINE 10 MG PO TABS: 10.0000 mg | ORAL_TABLET | Freq: Three times a day (TID) | ORAL | 0 refills | Status: DC | PRN

## 2017-08-25 MED ORDER — KETOROLAC TROMETHAMINE 30 MG/ML IJ SOLN: 30.0000 mg | Freq: Once | INTRAMUSCULAR | Status: DC | PRN

## 2017-08-25 SURGICAL SUPPLY — 23 items
BAG HAMPER (MISCELLANEOUS) ×2 IMPLANT
CLOTH BEACON ORANGE TIMEOUT ST (SAFETY) ×2 IMPLANT
COVER LIGHT HANDLE STERIS (MISCELLANEOUS) ×4 IMPLANT
GAUZE SPONGE 4X4 16PLY XRAY LF (GAUZE/BANDAGES/DRESSINGS) ×2 IMPLANT
GLOVE BIOGEL PI IND STRL 7.0 (GLOVE) ×2 IMPLANT
GLOVE BIOGEL PI IND STRL 8 (GLOVE) ×1 IMPLANT
GLOVE BIOGEL PI INDICATOR 7.0 (GLOVE) ×2
GLOVE BIOGEL PI INDICATOR 8 (GLOVE) ×1
GLOVE ECLIPSE 8.0 STRL XLNG CF (GLOVE) ×2 IMPLANT
GOWN STRL REUS W/TWL LRG LVL3 (GOWN DISPOSABLE) ×4 IMPLANT
GOWN STRL REUS W/TWL XL LVL3 (GOWN DISPOSABLE) ×2 IMPLANT
KIT BERKELEY 1ST TRIMESTER 3/8 (MISCELLANEOUS) ×2 IMPLANT
KIT ROOM TURNOVER AP CYSTO (KITS) ×2 IMPLANT
MANIFOLD NEPTUNE II (INSTRUMENTS) ×2 IMPLANT
MARKER SKIN DUAL TIP RULER LAB (MISCELLANEOUS) ×2 IMPLANT
NS IRRIG 1000ML POUR BTL (IV SOLUTION) ×2 IMPLANT
PACK BASIC III (CUSTOM PROCEDURE TRAY) ×1
PACK SRG BSC III STRL LF ECLPS (CUSTOM PROCEDURE TRAY) ×1 IMPLANT
PAD ARMBOARD 7.5X6 YLW CONV (MISCELLANEOUS) ×2 IMPLANT
SET BASIN LINEN APH (SET/KITS/TRAYS/PACK) ×2 IMPLANT
SET BERKELEY SUCTION TUBING (SUCTIONS) ×2 IMPLANT
SHEET LAVH (DRAPES) ×2 IMPLANT
VACURETTE 7MM CVD STRL WRAP (CANNULA) ×2 IMPLANT

## 2017-08-25 NOTE — Anesthesia Procedure Notes (Signed)
Procedure Name: LMA Insertion Date/Time: 08/25/2017 7:39 AM Performed by: Despina HiddenIdacavage, Cheyanna Strick J, CRNA Pre-anesthesia Checklist: Patient identified, Patient being monitored, Emergency Drugs available, Timeout performed and Suction available Patient Re-evaluated:Patient Re-evaluated prior to induction Oxygen Delivery Method: Circle System Utilized Preoxygenation: Pre-oxygenation with 100% oxygen Induction Type: IV induction Ventilation: Mask ventilation without difficulty LMA: LMA inserted LMA Size: 3.0 Grade View: Grade I Number of attempts: 1 Placement Confirmation: positive ETCO2 and breath sounds checked- equal and bilateral Tube secured with: Tape Dental Injury: Teeth and Oropharynx as per pre-operative assessment

## 2017-08-25 NOTE — Transfer of Care (Signed)
Immediate Anesthesia Transfer of Care Note  Patient: Catherine Haney  Procedure(s) Performed: SUCTION DILATATION AND CURETTAGE (N/A Vagina )  Patient Location: PACU  Anesthesia Type:General  Level of Consciousness: awake and patient cooperative  Airway & Oxygen Therapy: Patient Spontanous Breathing and Patient connected to nasal cannula oxygen  Post-op Assessment: Report given to RN, Post -op Vital signs reviewed and stable and Patient moving all extremities  Post vital signs: Reviewed and stable  Last Vitals:  Vitals Value Taken Time  BP    Temp    Pulse    Resp    SpO2      Last Pain:  Vitals:   08/25/17 0707  TempSrc: Oral  PainSc: 0-No pain      Patients Stated Pain Goal: 6 (08/25/17 0707)  Complications: No apparent anesthesia complications

## 2017-08-25 NOTE — Anesthesia Preprocedure Evaluation (Signed)
Anesthesia Evaluation  Patient identified by MRN, date of birth, ID band Patient awake    Reviewed: Allergy & Precautions, NPO status , Patient's Chart, lab work & pertinent test results  Airway Mallampati: I  TM Distance: >3 FB Neck ROM: Full    Dental no notable dental hx.    Pulmonary    Pulmonary exam normal breath sounds clear to auscultation       Cardiovascular Normal cardiovascular exam Rhythm:Regular Rate:Normal     Neuro/Psych Anxiety    GI/Hepatic GERD  ,  Endo/Other    Renal/GU      Musculoskeletal   Abdominal   Peds  Hematology   Anesthesia Other Findings   Reproductive/Obstetrics Incomplete AB                             Anesthesia Physical Anesthesia Plan  ASA: II  Anesthesia Plan: General   Post-op Pain Management:    Induction: Intravenous  PONV Risk Score and Plan: Ondansetron and Dexamethasone  Airway Management Planned:   Additional Equipment:   Intra-op Plan:   Post-operative Plan: Extubation in OR  Informed Consent: I have reviewed the patients History and Physical, chart, labs and discussed the procedure including the risks, benefits and alternatives for the proposed anesthesia with the patient or authorized representative who has indicated his/her understanding and acceptance.   Dental advisory given  Plan Discussed with: CRNA  Anesthesia Plan Comments:         Anesthesia Quick Evaluation

## 2017-08-25 NOTE — Progress Notes (Signed)
Per patient husband "is on the way".

## 2017-08-25 NOTE — Progress Notes (Signed)
Waiting for Patient husband to arrive to pick patient up.

## 2017-08-25 NOTE — Op Note (Signed)
Preoperative diagnosis:  Missed Abortion in the first trimester, with unsuccessful attempts at outpatient management   Postoperative diagnosis:  Same as above  Procedure:  Cervical dilation with suction and sharp uterine curettage  Surgeon:  Lazaro ArmsEURE,Yassir Enis H  Anesthesia:  Laryngeal mask airway  Findings:  The patient was known to have a first trimester pregnancy loss by l sonogram evaluations from the office.  She had actually undergone Cytotec vaginally and Cytotec orally and subsequently sonogram evaluations and was found to not have passed a pregnancy at all.   As a result she is admitted for a uterine  Evacuation.  Description of operation:  The patient was taken to the operating room and placed in the supine position.  She underwent laryngeal mask airway general anesthesia.  The patient was placed in the dorsal lithotomy position.  The vagina was prepped and draped in the usual sterile fashion.  A Graves speculum was placed.  The anterior cervix was grasped with a single-tooth tenaculum.  The cervix was dilated serially with Hegar dilators.  A #7 curved suction curette was placed in the uterus.  The suction pressure was placed at 55 and several passes were made.  All of the intrauterine contents were removed.  The sharp curette was used x1 to feel uterine crie in all areas.  The patient was given Methergine 0.2 mg IV x1.  There was good hemostasis.  The patient was given Ancef and toradol IV preoperatively.  .  Estimated blood loss for the procedure was 50cc.  The patient was awakened from anesthesia taken to the recovery room in good stable condition.  All counts were correct x3.  Lazaro ArmsLuther H Yentl Verge, MD 08/25/2017 8:08 AM

## 2017-08-25 NOTE — Progress Notes (Signed)
Per patient, "he will be here maybe 15 minutes". Patient sitting in recliner, sipping water.

## 2017-08-25 NOTE — Anesthesia Postprocedure Evaluation (Signed)
Anesthesia Post Note  Patient: Catherine Haney  Procedure(s) Performed: SUCTION DILATATION AND CURETTAGE (N/A Vagina )  Patient location during evaluation: PACU Anesthesia Type: General Level of consciousness: awake and patient cooperative Pain management: pain level controlled Vital Signs Assessment: post-procedure vital signs reviewed and stable Respiratory status: spontaneous breathing, nonlabored ventilation and respiratory function stable Cardiovascular status: blood pressure returned to baseline Postop Assessment: no apparent nausea or vomiting Anesthetic complications: no     Last Vitals:  Vitals:   08/25/17 0821 08/25/17 0830  BP: 114/76   Pulse: 92 73  Resp: (!) 21 15  Temp: 36.4 C   SpO2: 100% 100%    Last Pain:  Vitals:   08/25/17 0821  TempSrc:   PainSc: 0-No pain                 Marylynne Keelin J

## 2017-08-25 NOTE — H&P (Signed)
Preoperative History and Physical  Catherine Haney is a 27 y.o. [id number] with No LMP recorded. admitted for a suction D&C for first trimester missed AB.  S/p cytotec x 2 without success as documented by recent sonogram  PMH:    Past Medical History:  Diagnosis Date  . GERD (gastroesophageal reflux disease)     PSH:    No past surgical history on file.  POb/GynH:      OB History    Gravida  3   Para  2   Term  2   Preterm      AB  1   Living  2     SAB  1   TAB      Ectopic      Multiple      Live Births  2           SH:   Social History   Tobacco Use  . Smoking status: Never Smoker  . Smokeless tobacco: Never Used  Substance Use Topics  . Alcohol use: No    Alcohol/week: 0.0 oz  . Drug use: No    FH:    Family History  Problem Relation Age of Onset  . Hypertension Mother   . Diabetes Father      Allergies: No Known Allergies  Medications:       Current Facility-Administered Medications:  .  ceFAZolin (ANCEF) IVPB 2g/100 mL premix, 2 g, Intravenous, On Call to OR, Lazaro Arms, MD .  ketorolac (TORADOL) 30 MG/ML injection 30 mg, 30 mg, Intravenous, Once, Lazaro Arms, MD  Review of Systems:   Review of Systems  Constitutional: Negative for fever, chills, weight loss, malaise/fatigue and diaphoresis.  HENT: Negative for hearing loss, ear pain, nosebleeds, congestion, sore throat, neck pain, tinnitus and ear discharge.   Eyes: Negative for blurred vision, double vision, photophobia, pain, discharge and redness.  Respiratory: Negative for cough, hemoptysis, sputum production, shortness of breath, wheezing and stridor.   Cardiovascular: Negative for chest pain, palpitations, orthopnea, claudication, leg swelling and PND.  Gastrointestinal: Positive for abdominal pain. Negative for heartburn, nausea, vomiting, diarrhea, constipation, blood in stool and melena.  Genitourinary: Negative for dysuria, urgency, frequency, hematuria and flank  pain.  Musculoskeletal: Negative for myalgias, back pain, joint pain and falls.  Skin: Negative for itching and rash.  Neurological: Negative for dizziness, tingling, tremors, sensory change, speech change, focal weakness, seizures, loss of consciousness, weakness and headaches.  Endo/Heme/Allergies: Negative for environmental allergies and polydipsia. Does not bruise/bleed easily.  Psychiatric/Behavioral: Negative for depression, suicidal ideas, hallucinations, memory loss and substance abuse. The patient is not nervous/anxious and does not have insomnia.      PHYSICAL EXAM:  There were no vitals taken for this visit.    Vitals reviewed. Constitutional: She is oriented to person, place, and time. She appears well-developed and well-nourished.  HENT:  Head: Normocephalic and atraumatic.  Right Ear: External ear normal.  Left Ear: External ear normal.  Nose: Nose normal.  Mouth/Throat: Oropharynx is clear and moist.  Eyes: Conjunctivae and EOM are normal. Pupils are equal, round, and reactive to light. Right eye exhibits no discharge. Left eye exhibits no discharge. No scleral icterus.  Neck: Normal range of motion. Neck supple. No tracheal deviation present. No thyromegaly present.  Cardiovascular: Normal rate, regular rhythm, normal heart sounds and intact distal pulses.  Exam reveals no gallop and no friction rub.   No murmur heard. Respiratory: Effort normal and breath sounds normal. No respiratory  distress. She has no wheezes. She has no rales. She exhibits no tenderness.  GI: Soft. Bowel sounds are normal. She exhibits no distension and no mass. There is tenderness. There is no rebound and no guarding.  Genitourinary:       Vulva is normal without lesions Vagina is pink moist without discharge Cervix normal in appearance and pap is normal Uterus is normal size, contour, position, consistency, mobility, non-tender Adnexa is negative with normal sized ovaries by sonogram   Musculoskeletal: Normal range of motion. She exhibits no edema and no tenderness.  Neurological: She is alert and oriented to person, place, and time. She has normal reflexes. She displays normal reflexes. No cranial nerve deficit. She exhibits normal muscle tone. Coordination normal.  Skin: Skin is warm and dry. No rash noted. No erythema. No pallor.  Psychiatric: She has a normal mood and affect. Her behavior is normal. Judgment and thought content normal.    Labs: Results for orders placed or performed in visit on 08/21/17 (from the past 336 hour(s))  Rapid Strep Screen (Not at [facility])   Collection Time: 08/21/17  5:34 PM  Result Value Ref Range   Strep Gp A Ag, IA W/Reflex Negative Negative  Culture, Group A Strep   Collection Time: 08/21/17  5:34 PM  Result Value Ref Range   Strep A Culture CANCELED   Results for orders placed or performed in visit on 08/17/17 (from the past 336 hour(s))  Beta hCG quant (ref lab)   Collection Time: 08/17/17  9:36 AM  Result Value Ref Range   hCG Quant 26,030 mIU/mL    EKG: No orders found for this or any previous visit.  Imaging Studies: US Ob Comp Less 14 Wks  Result Date: 08/12/2017 FOLLOW UP SONOGRAM Catherine Haney is in the office for a follow up sonogram for viability. She is a 27 y.o. year old [id number] with Estimated Date of Delivery: 03/24/18 by early ultrasound now at  [redacted]w[redacted]d weeks gestation. Thus far the pregnancy has been complicated by enlarged YS,bradycardia. GESTATION: SINGLETON FETAL ACTIVITY:          Heart rate         No fht        . AMNIOTIC FLUID: The amniotic fluid volume is  normal CERVIX: Appears closed ADNEXA: The ovaries are normal. GESTATIONAL AGE AND  BIOMETRICS: Gestational criteria: Estimated Date of Delivery: 03/24/18 by early ultrasound now at [redacted]w[redacted]d Previous Scans:1 GESTATIONAL SAC           24.0 mm         7+3 weeks CROWN RUMP LENGTH           5.7 mm         6+2 weeks                                                                       AVERAGE EGA(BY THIS SCAN):  6+2 weeks                                             SUSPECTED ABNORMALITIES:  yes,no fht,enlarged YS QUALITY OF SCAN: satisfactory TECHNICIAN  COMMENTS: US 6+2 wks fetal pole,no fetal heart tones,enlarged YS,normal ovaries bilat,GS 24.0 mm,Kim discussed results w/pt A copy of this report including all images has been saved and backed up to a second source for retrieval if needed. All measures and details of the anatomical scan, placentation, fluid volume and pelvic anatomy are contained in that report. Amber Flora LippsJ Carl 08/09/2017 8:56 AM Clinical Impression and recommendations: I have reviewed the sonogram results above. Followup u/s due to prior u/s with fetal bradycardia. Current u/s confirms missed abortion. Combined with the patient's current clinical course, below are my impressions and any appropriate recommendations for management based on the sonographic findings: 1. Interval u/s shows absence for FHT and lack of fetal growth since prior u/s, with Missed Ab confirmed. Options if Cytotec have been discussed with patient and accepted. Tilda BurrowJohn V Ferguson      Assessment: Missed Ab, first trimester, s/p cytotec x 2 unsuccessfully   Patient Active Problem List   Diagnosis Date Noted  . Missed abortion 08/09/2017  . Yeast infection 01/19/2017  . Vitamin D deficiency 08/27/2015  . Hyperlipidemia 08/27/2015  . GERD (gastroesophageal reflux disease) 08/20/2015  . Generalized anxiety disorder 07/24/2015  . Other seasonal allergic rhinitis 03/18/2015  . Chronic pain of multiple joints 03/18/2015    Plan: Cervical dilation, suction/sharp uterine curettage  Lazaro ArmsLuther H Khaza Blansett 08/25/2017 7:04 AM

## 2017-08-25 NOTE — Discharge Instructions (Signed)
Miscarriage A miscarriage is the loss of an unborn baby (fetus) before the 20th week of pregnancy. The cause is often unknown. Follow these instructions at home:  You may need to stay in bed (bed rest), or you may be able to do light activity. Go about activity as told by your doctor.  Have help at home.  Write down how many pads you use each day. Write down how soaked they are.  Do not use tampons. Do not wash out your vagina (douche) or have sex (intercourse) until your doctor approves.  Only take medicine as told by your doctor.  Do not take aspirin.  Keep all doctor visits as told.  If you or your partner have problems with grieving, talk to your doctor. You can also try counseling. Give yourself time to grieve before trying to get pregnant again. Get help right away if:  You have bad cramps or pain in your back or belly (abdomen).  You have a fever.  You pass large clumps of blood (clots) from your vagina that are walnut-sized or larger. Save the clumps for your doctor to see.  You pass large amounts of tissue from your vagina. Save the tissue for your doctor to see.  You have more bleeding.  You have thick, bad-smelling fluid (discharge) coming from the vagina.  You get lightheaded, weak, or you pass out (faint).  You have chills. This information is not intended to replace advice given to you by your health care provider. Make sure you discuss any questions you have with your health care provider. Document Released: 08/01/2011 Document Revised: 10/15/2015 Document Reviewed: 06/09/2011 Elsevier Interactive Patient Education  2017 Elsevier Inc[employer]    PATIENT INSTRUCTIONS POST-ANESTHESIA  IMMEDIATELY FOLLOWING SURGERY:  Do not drive or operate machinery for the first twenty four hours after surgery.  Do not make any important decisions for twenty four hours after surgery or while taking narcotic pain medications or sedatives.  If you develop intractable nausea and  vomiting or a severe headache please notify your doctor immediately.  FOLLOW-UP:  Please make an appointment with your surgeon as instructed. You do not need to follow up with anesthesia unless specifically instructed to do so.  WOUND CARE INSTRUCTIONS (if applicable):  Keep a dry clean dressing on the anesthesia/puncture wound site if there is drainage.  Once the wound has quit draining you may leave it open to air.  Generally you should leave the bandage intact for twenty four hours unless there is drainage.  If the epidural site drains for more than 36-48 hours please call the anesthesia department.  QUESTIONS?:  Please feel free to call your physician or the hospital operator if you have any questions, and they will be happy to assist you.

## 2017-08-28 ENCOUNTER — Encounter (HOSPITAL_COMMUNITY): Payer: Self-pay | Admitting: Obstetrics & Gynecology

## 2017-08-29 ENCOUNTER — Telehealth: Payer: Self-pay | Admitting: Adult Health

## 2017-08-30 NOTE — Telephone Encounter (Signed)
Arabic interpreter ID [phone]  Patient states she had bleeding and nausea yesterday along with sharp cramping in her lower bleeding.  Bleeding as if she is starting her period and changing a pad about every 2 hours.  Informed patient that this could be her cycle but if bleeding continued past 5-7 days and became heavier, to let us know.  Also states she is still experiencing nausea.  States she is still taking the pain medication.  Informed patient that pain medication can cause nausea if taken on an empty stomach. Advised to eat when taking medication.  Verbalized understanding and all questions answered.

## 2017-09-05 ENCOUNTER — Encounter: Payer: Self-pay | Admitting: Obstetrics & Gynecology

## 2017-09-05 ENCOUNTER — Other Ambulatory Visit: Payer: Self-pay

## 2017-09-05 ENCOUNTER — Ambulatory Visit: Payer: BLUE CROSS/BLUE SHIELD | Admitting: Obstetrics & Gynecology

## 2017-09-05 VITALS — BP 102/64 | HR 72 | Wt 120.0 lb

## 2017-09-05 DIAGNOSIS — Z9889 Other specified postprocedural states: Secondary | ICD-10-CM

## 2017-09-05 NOTE — Progress Notes (Signed)
Interpreter [phone]#[id number] with [employer] used for interpretation

## 2017-09-05 NOTE — Progress Notes (Signed)
  HPI: Patient returns for routine postoperative follow-up having undergone D&C on 08/25/2017.  The patient's immediate postoperative recovery has been unremarkable. Since hospital discharge the patient reports no complaints except just feels tired.   Current Outpatient Medications: cetirizine (ZYRTEC) 10 MG tablet, Take 1 tablet (10 mg total) by mouth daily., Disp: 30 tablet, Rfl: 11 fluticasone (FLONASE) 50 MCG/ACT nasal spray, Place 2 sprays into both nostrils daily., Disp: 16 g, Rfl: 6 omeprazole (PRILOSEC) 20 MG capsule, Take 20 mg by mouth daily before breakfast., Disp: , Rfl:  HYDROcodone-acetaminophen (NORCO/VICODIN) 5-325 MG tablet, Take 1 tablet by mouth every 6 (six) hours as needed. (Patient not taking: Reported on 09/05/2017), Disp: 10 tablet, Rfl: 0 ketorolac (TORADOL) 10 MG tablet, Take 1 tablet (10 mg total) by mouth every 8 (eight) hours as needed. (Patient not taking: Reported on 09/05/2017), Disp: 15 tablet, Rfl: 0 loratadine (CLARITIN) 10 MG tablet, Take 10 mg by mouth at bedtime., Disp: , Rfl:   No current facility-administered medications for this visit.     Blood pressure 102/64, pulse 72, weight 120 lb (54.4 kg).  Physical Exam: Normal post D&C for missed Ab  Diagnostic Tests:   Pathology: benign  Impression: S/p D&C for missed AB first trimester  Plan: Pt does not desire birth control  Follow up: prn     Lazaro ArmsLuther H Kya Mayfield, MD

## 2017-10-06 IMAGING — US US TRANSVAGINAL NON-OB
1 series · 13 of 25 positions shown · non-contrast
Comparison: None

CLINICAL DATA: Initial evaluation for acute pelvic pain, vaginal
bleeding knee.

EXAM:
TRANSABDOMINAL AND TRANSVAGINAL ULTRASOUND OF PELVIS
TECHNIQUE: Both transabdominal and transvaginal ultrasound examinations of the
pelvis were performed. Transabdominal technique was performed for
global imaging of the pelvis including uterus, ovaries, adnexal
regions, and pelvic cul-de-sac. It was necessary to proceed with
endovaginal exam following the transabdominal exam to visualize the
uterus and ovaries.

[Series 1: us transvaginal non-ob · 0.17mm/px · 13 of 43 slices shown]
[im 1/43]
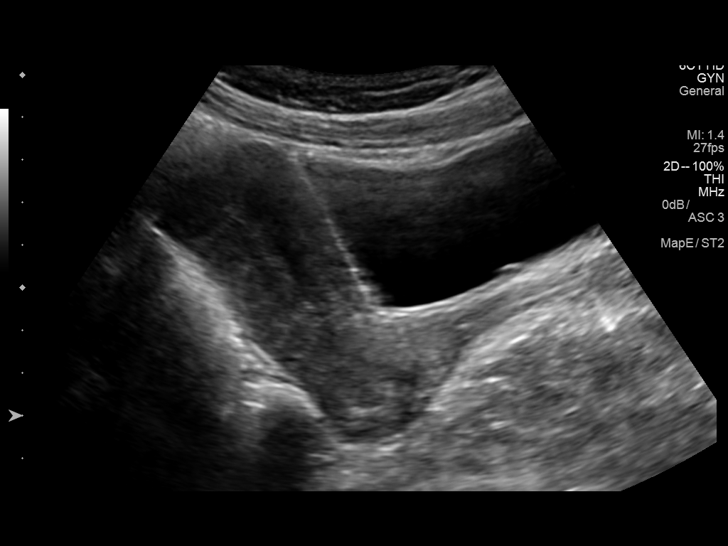
[im 4/43]
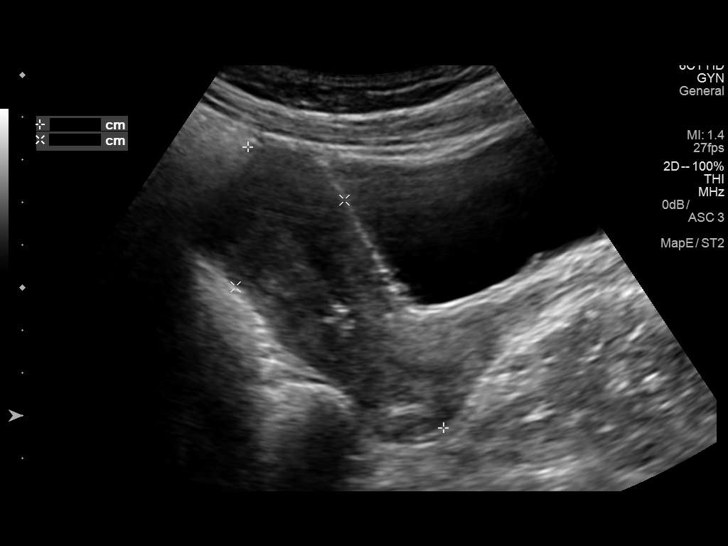
[im 8/43]
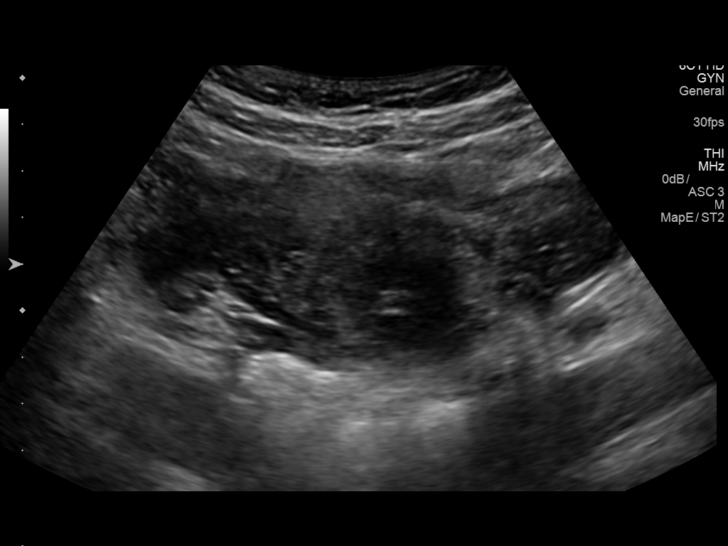
[im 11/43]
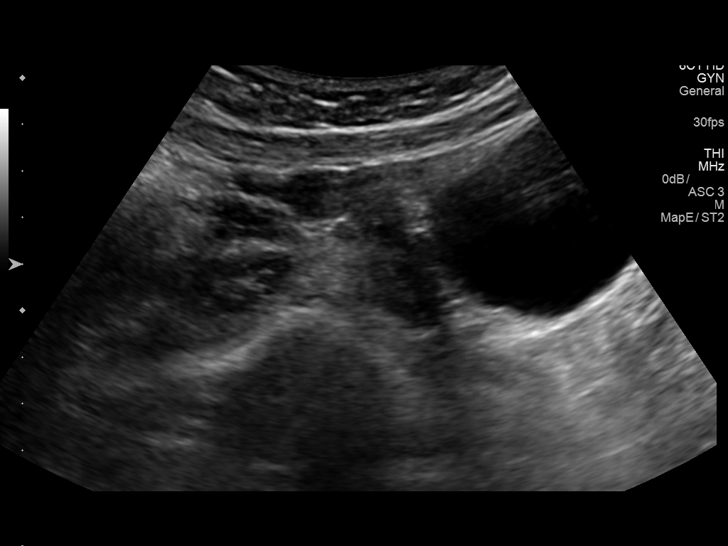
[im 15/43]
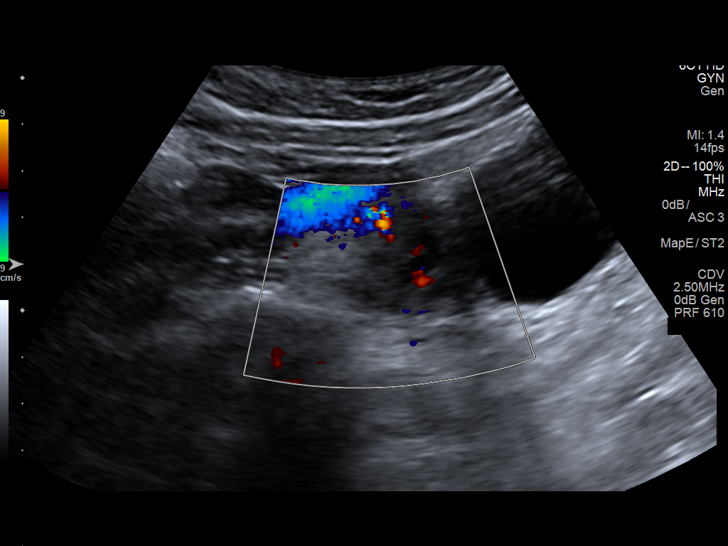
[im 18/43]
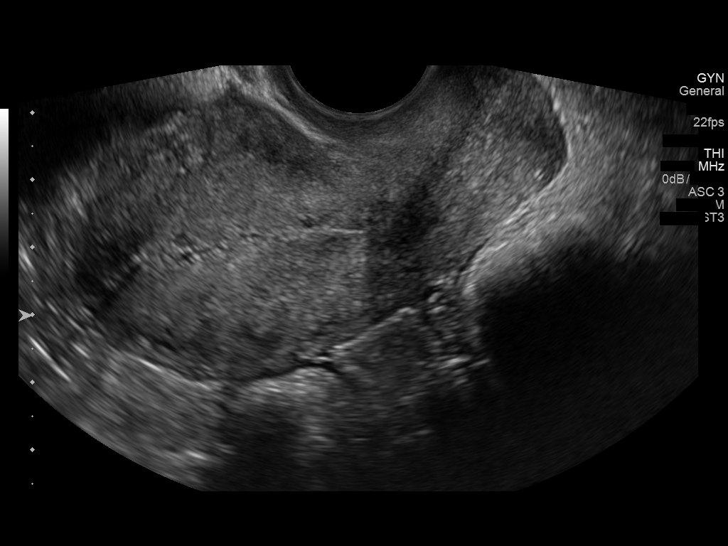
[im 22/43]
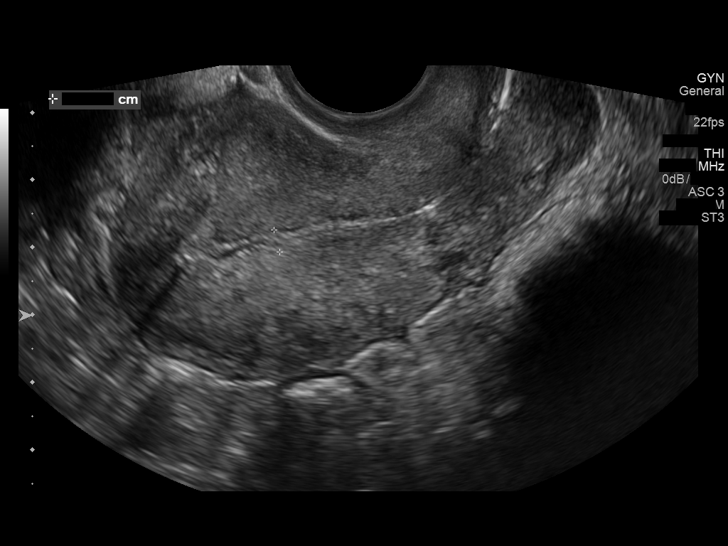
[im 25/43]
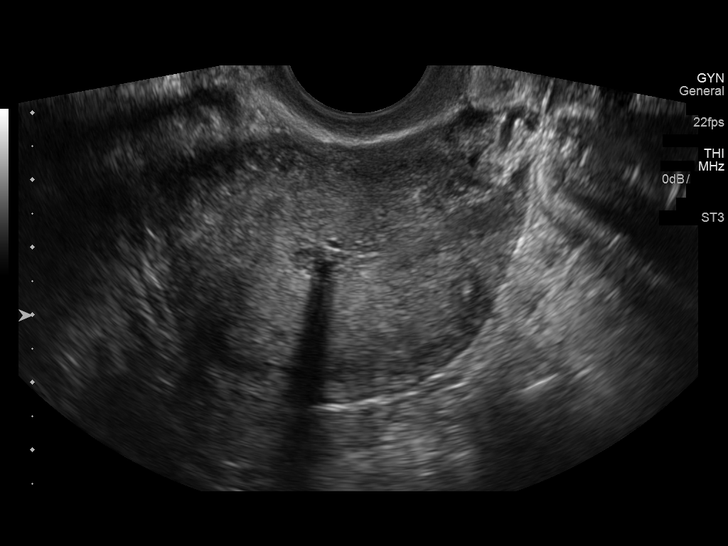
[im 29/43]
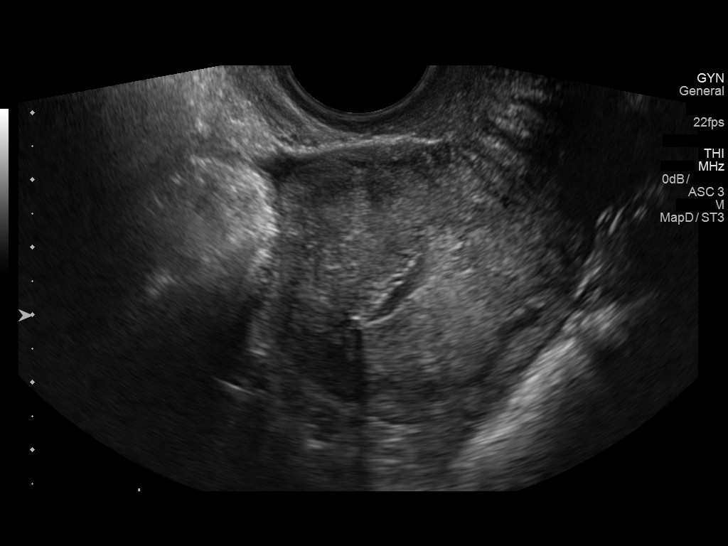
[im 32/43]
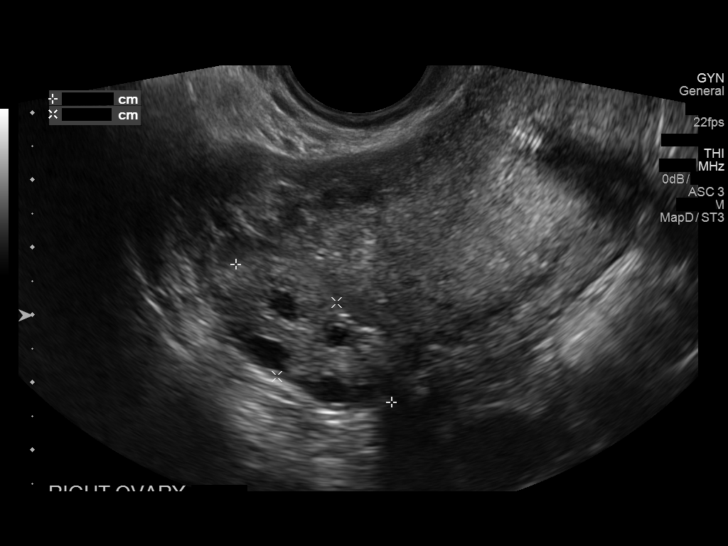
[im 36/43]
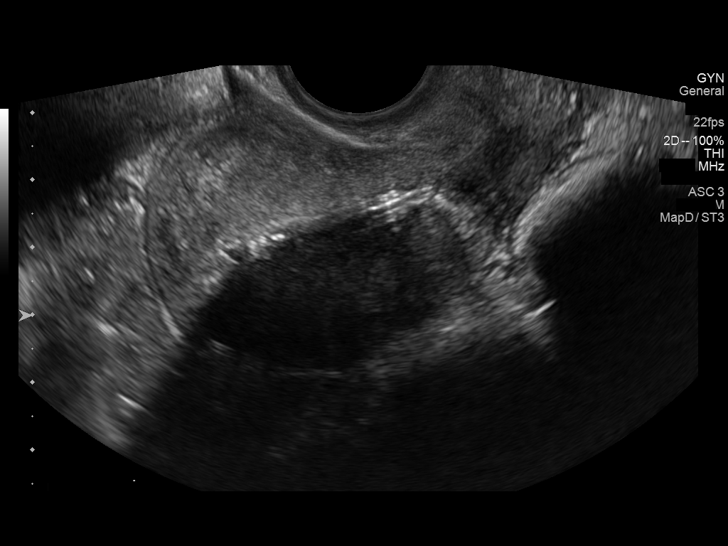
[im 39/43]
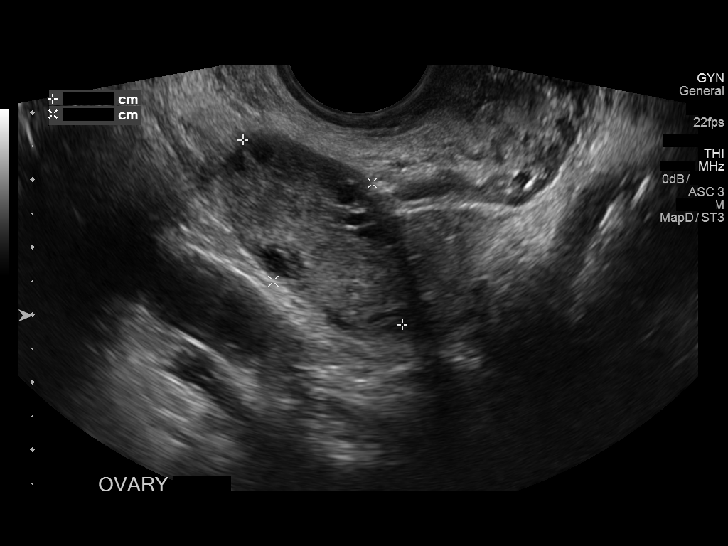
[im 43/43]
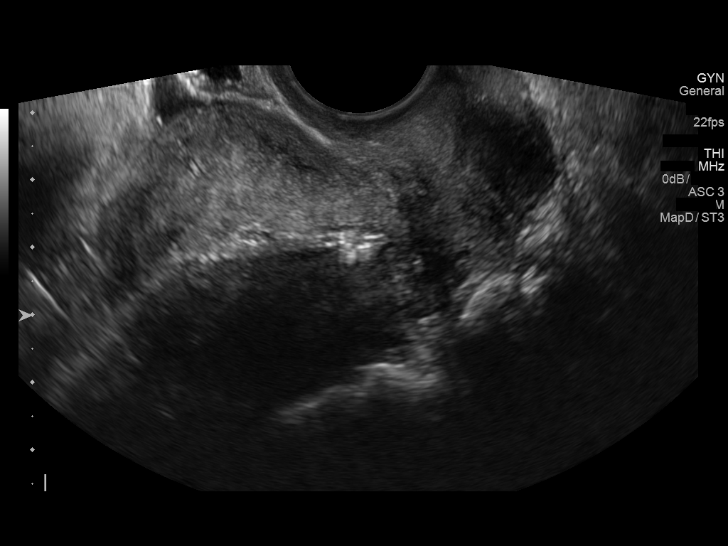

[13 of 25 positions shown; findings below may reference images not displayed]

FINDINGS: Uterus

Measurements: 7.6 x 4.4 x 5.1 cm. No fibroids or other mass
visualized.

Endometrium

Thickness: 3.4 mm. No focal abnormality visualized. IUD in place,
and appear to be appropriately positioned. Possible trace fluid
noted within the endometrial canal, likely related history of
vaginal bleeding, and felt to be of doubtful significance.

Right ovary

Measurements: 3.1 x 1.4 x 1.9 cm. Normal appearance/no adnexal mass.

Left ovary

Measurements: 3.6 x 2.1 x 2.3 cm. Normal appearance/no adnexal mass.

Other findings

Trace free fluid, likely physiologic.
IMPRESSION: 1. IUD in appropriate position within the uterus.
2. Otherwise unremarkable pelvic ultrasound. No acute abnormality
identified.

## 2017-10-12 ENCOUNTER — Telehealth: Payer: Self-pay | Admitting: Family Medicine

## 2017-10-12 NOTE — Telephone Encounter (Signed)
Line busy

## 2017-10-17 ENCOUNTER — Ambulatory Visit: Payer: BLUE CROSS/BLUE SHIELD | Admitting: Family Medicine

## 2017-10-17 ENCOUNTER — Ambulatory Visit (INDEPENDENT_AMBULATORY_CARE_PROVIDER_SITE_OTHER): Payer: BLUE CROSS/BLUE SHIELD | Admitting: Family Medicine

## 2017-10-17 ENCOUNTER — Telehealth: Payer: Self-pay | Admitting: *Deleted

## 2017-10-17 VITALS — BP 109/74 | HR 66 | Temp 97.8°F | Ht 60.0 in | Wt 115.0 lb

## 2017-10-17 DIAGNOSIS — R0789 Other chest pain: Secondary | ICD-10-CM | POA: Diagnosis not present

## 2017-10-17 DIAGNOSIS — R102 Pelvic and perineal pain: Secondary | ICD-10-CM

## 2017-10-17 DIAGNOSIS — T23102A Burn of first degree of left hand, unspecified site, initial encounter: Secondary | ICD-10-CM | POA: Diagnosis not present
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
## 2017-10-17 NOTE — Patient Instructions (Signed)
  .     .          .     Tylenol  Ibuprofen    .      Maderma  Vitamin E     . laqad qumt bi'iihalatik 'iilaa 'akhsayiyi alqalb waleawdat 'iilaa tabib alnisa' li'almak alhudi. sayatimu alaitisal bik litahdid maweid. laydak , aistamara fi tatbiq alkarim aldhy tama wasafah lik. [place] aistikhdam Tylenol 'aw Ibuprofen lil'alam 'iidha lazam al'amru. [place] albad' fi aistikhdam zayt Maderma 'aw Vitamin E litaqlil altanadub fi hawalay 'usbue.

## 2017-10-17 NOTE — Telephone Encounter (Signed)
[employer] interpreter # [phone] used to return pts call. Pt states that she has been experiencing some lower abdominal pain and having a watery discharge. She states that she is not having any vaginal itching and the discharge does not have an odor. Pt given an appt to have discharge evaluated.
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
## 2017-10-26 ENCOUNTER — Ambulatory Visit: Payer: BLUE CROSS/BLUE SHIELD | Admitting: Women's Health

## 2017-10-26 ENCOUNTER — Encounter: Payer: Self-pay | Admitting: Women's Health

## 2017-10-26 ENCOUNTER — Ambulatory Visit (INDEPENDENT_AMBULATORY_CARE_PROVIDER_SITE_OTHER): Payer: BLUE CROSS/BLUE SHIELD | Admitting: Women's Health

## 2017-10-26 VITALS — BP 101/62 | HR 88 | Ht 62.0 in | Wt 114.8 lb

## 2017-10-26 DIAGNOSIS — Z3202 Encounter for pregnancy test, result negative: Secondary | ICD-10-CM | POA: Diagnosis not present

## 2017-10-26 DIAGNOSIS — N719 Inflammatory disease of uterus, unspecified: Secondary | ICD-10-CM | POA: Diagnosis not present

## 2017-10-26 DIAGNOSIS — R109 Unspecified abdominal pain: Secondary | ICD-10-CM

## 2017-10-26 DIAGNOSIS — N809 Endometriosis, unspecified: Secondary | ICD-10-CM

## 2017-10-26 DIAGNOSIS — N949 Unspecified condition associated with female genital organs and menstrual cycle: Secondary | ICD-10-CM

## 2017-10-26 LAB — POCT WET PREP (WET MOUNT)
CLUE CELLS WET PREP WHIFF POC: NEGATIVE
Trichomonas Wet Prep HPF POC: ABSENT

## 2017-10-26 LAB — POCT URINE PREGNANCY: PREG TEST UR: NEGATIVE

## 2017-10-26 MED ORDER — DOXYCYCLINE HYCLATE 100 MG PO TABS
100.0000 mg | ORAL_TABLET | Freq: Two times a day (BID) | ORAL | 0 refills | Status: DC
Start: 2017-10-26 — End: 2018-02-16

## 2017-10-26 NOTE — Progress Notes (Signed)
GYN VISIT Patient name: Catherine Haney MRN [id number]  Date of birth: [birth year] Chief Complaint:   low abd pain (vaginal discharge)  History of Present Illness:   Catherine Haney is a 27 y.o. [phone] female being seen today for report of constant low abdominal pain and watery discharge since beginning of May. Denies itching/odor/irritation, fever/chills.  Had D&C for missed Ab 08/25/17. Not currently on contraception, using w/drawal method.     Patient's last menstrual period was 09/26/2017. The current method of family planning is coitus interruptus. Last pap early 2019 at PCP. Results were:  normal Review of Systems:   Pertinent items are noted in HPI Denies fever/chills, dizziness, headaches, visual disturbances, fatigue, shortness of breath, chest pain, abdominal pain, vomiting, abnormal vaginal discharge/itching/odor/irritation, problems with periods, bowel movements, urination, or intercourse unless otherwise stated above.  Pertinent History Reviewed:  Reviewed past medical,surgical, social, obstetrical and family history.  Reviewed problem list, medications and allergies. Physical Assessment:   Vitals:   10/26/17 1013  BP: 101/62  Pulse: 88  Weight: 114 lb 12.8 oz (52.1 kg)  Height: 5\' 2"  (1.575 m)  Body mass index is 21 kg/m.       Physical Examination:   General appearance: alert, well appearing, and in no distress  Mental status: alert, oriented to person, place, and time  Skin: warm & dry   Cardiovascular: normal heart rate noted  Respiratory: normal respiratory effort, no distress  Abdomen: soft, low mid abd tender to palpation   Pelvic: VULVA: normal appearing vulva with no masses, tenderness or lesions, VAGINA: normal appearing vagina with normal color and small amt thin nonodorous discharge, no lesions, CERVIX: normal appearing cervix without discharge or lesions, UTERUS: uterus is normal size, shape, consistency and +tenderness, ADNEXA: normal adnexa in size,  nontender and no masses  Extremities: no edema   Results for orders placed or performed in visit on 10/26/17 (from the past 24 hour(s))  POCT urine pregnancy   Collection Time: 10/26/17 10:59 AM  Result Value Ref Range   Preg Test, Ur Negative Negative  POCT Wet Prep [facility]([place]   Collection Time: 10/26/17  1:00 PM  Result Value Ref Range   Source Wet Prep POC vaginal    WBC, Wet Prep HPF POC many    Bacteria Wet Prep HPF POC Few Few   BACTERIA WET PREP MORPHOLOGY POC     Clue Cells Wet Prep HPF POC None None   Clue Cells Wet Prep Whiff POC Negative Whiff    Yeast Wet Prep HPF POC None None   KOH Wet Prep POC  None   Trichomonas Wet Prep HPF POC Absent Absent    Assessment & Plan:  1) Endometritis s/p D&C> discussed w/ [facility], rx doxycycline 100mg  BID x 14d, let us know if not improving  Meds:  Meds ordered this encounter  Medications  . doxycycline (VIBRA-TABS) 100 MG tablet    Sig: Take 1 tablet (100 mg total) by mouth 2 (two) times daily. X 14 days    Dispense:  28 tablet    Refill:  0    Order Specific Question:   Supervising Provider    Answer:   Duane LopeEURE, LUTHER H [[id number]]    Orders Placed This Encounter  Procedures  . GC/Chlamydia Probe Amp  . POCT urine pregnancy  . POCT Wet Prep [facility]([place])    Return for prn.   Arabic [facility] for encounter  Cheral MarkerKimberly R Domnique Vantine CNM, [facility] BankWHNP-BC 10/26/2017 1:01 PM

## 2017-10-28 LAB — GC/CHLAMYDIA PROBE AMP
Chlamydia trachomatis, NAA: NEGATIVE
Neisseria gonorrhoeae by PCR: NEGATIVE

## 2018-01-08 ENCOUNTER — Telehealth: Payer: Self-pay | Admitting: Family Medicine

## 2018-01-08 ENCOUNTER — Ambulatory Visit: Payer: BLUE CROSS/BLUE SHIELD | Admitting: Cardiovascular Disease

## 2018-01-08 MED ORDER — OMEPRAZOLE 20 MG PO CPDR: 20.0000 mg | DELAYED_RELEASE_CAPSULE | Freq: Every day | ORAL | 3 refills | Status: DC

## 2018-01-08 NOTE — Telephone Encounter (Signed)
Patient was last seen 5/28- states that ranitidine does not work and she would like for omeprazole be sent into the pharmacy. Please advise

## 2018-01-08 NOTE — Telephone Encounter (Signed)
I sent omeprazole for the patient, have her take it 30 minutes before breakfast in the morning

## 2018-01-09 NOTE — Telephone Encounter (Signed)
Left message for patient to call back  

## 2018-02-16 ENCOUNTER — Encounter: Payer: Self-pay | Admitting: Family Medicine

## 2018-02-16 ENCOUNTER — Ambulatory Visit: Payer: BLUE CROSS/BLUE SHIELD | Admitting: Family Medicine

## 2018-02-16 VITALS — BP 106/72 | HR 80 | Temp 97.1°F | Ht 62.0 in | Wt 122.8 lb

## 2018-02-16 DIAGNOSIS — R5383 Other fatigue: Secondary | ICD-10-CM

## 2018-02-16 DIAGNOSIS — M542 Cervicalgia: Secondary | ICD-10-CM

## 2018-02-16 DIAGNOSIS — M791 Myalgia, unspecified site: Secondary | ICD-10-CM

## 2018-02-16 DIAGNOSIS — M549 Dorsalgia, unspecified: Secondary | ICD-10-CM | POA: Diagnosis not present

## 2018-02-16 LAB — URINALYSIS, COMPLETE
BILIRUBIN UA: NEGATIVE
GLUCOSE, UA: NEGATIVE
LEUKOCYTES UA: NEGATIVE
Nitrite, UA: NEGATIVE
Specific Gravity, UA: >1.03 — ABNORMAL HIGH (ref 1.005–1.030)
UUROB: 0.2 mg/dL (ref 0.2–1.0)
pH, UA: 5.5 (ref 5.0–7.5)

## 2018-02-16 LAB — MICROSCOPIC EXAMINATION: RBC, UA: NONE SEEN /hpf (ref 0–2)

## 2018-02-16 LAB — PREGNANCY, URINE: Preg Test, Ur: NEGATIVE

## 2018-02-16 MED ORDER — CYCLOBENZAPRINE HCL 10 MG PO TABS: 10.0000 mg | ORAL_TABLET | Freq: Three times a day (TID) | ORAL | 1 refills | Status: DC | PRN

## 2018-02-16 NOTE — Progress Notes (Signed)
BP 106/72   Pulse 80   Temp (!) 97.1 F (36.2 C) (Oral)   Ht _0  (1.575 m)   Wt 122 lb 12.8 oz (55.7 kg)   BMI 22.46 kg/m    Subjective:    Patient ID: Catherine Haney, female    DOB: [birth year], 27 y.o.   MRN: [id number]  HPI: Catherine Haney is a 27 y.o. female presenting on 02/16/2018 for middle back pain (x 2 months - Has cardiology apt 10/01. Patient states she wants to sleep all the time); Fatigue; and Chest Pain   HPI Back pain and neck pain and fatigue and chest pain Patient comes in complaining of back pain and neck pain and fatigue and chest pain pressure.  She says it hurts more on the right side of her neck and down into her right upper back and into her right chest.  She says it does hurt when she touches it or when she turns to one side.  She says this is been going on for a couple months.  She has had issues with muscle aches and pains in her upper back and neck previously and Flexeril has worked but that it always comes back.  She does have a cardiology appointment on 02/20/2018 to rule out any possibilities of chest pain coming from a cardiac source although I do not think that where this pain is coming from.  She denies any shortness of breath or wheezing or pain with increased inspiration.  She says as the day goes on and she stretches out more the pain improves.  She denies any recent cough or cold symptoms.  She denies any tick bites that she knows of.  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  Patient does have a couple of children and had a recent miscarriage earlier this year and plans to have more children and possibly get pregnant in a month or 2  Relevant past medical, surgical, family and social history reviewed and updated as indicated. Interim medical history since our last visit reviewed. Allergies and medications reviewed and updated.  Review of Systems  Constitutional: Positive for fatigue. Negative for chills,  diaphoresis and fever.  Eyes: Negative for visual disturbance.  Respiratory: Negative for chest tightness and shortness of breath.   Cardiovascular: Positive for chest pain. Negative for palpitations and leg swelling.  Gastrointestinal: Negative for abdominal pain, nausea and vomiting.  Musculoskeletal: Positive for arthralgias, back pain and myalgias. Negative for gait problem.  Skin: Negative for color change and rash.  Neurological: Negative for dizziness, weakness, light-headedness, numbness and headaches.  Psychiatric/Behavioral: Negative for agitation and behavioral problems.  All other systems reviewed and are negative.   Per HPI unless specifically indicated above   Allergies as of 02/16/2018   No Known Allergies     Medication List        Accurate as of 02/16/18  4:26 PM. Always use your most recent med list.          omeprazole 20 MG capsule Commonly known as:  PRILOSEC Take 1 capsule (20 mg total) by mouth daily.          Objective:    BP 106/72   Pulse 80   Temp (!) 97.1 F (36.2 C) (Oral)   Ht _1  (1.575 m)   Wt 122 lb 12.8 oz (55.7 kg)   BMI 22.46 kg/m   Wt Readings from Last 3 Encounters:  02/16/18 122 lb  12.8 oz (55.7 kg)  10/26/17 114 lb 12.8 oz (52.1 kg)  10/17/17 115 lb (52.2 kg)    Physical Exam  Constitutional: She is oriented to person, place, and time. She appears well-developed and well-nourished. No distress.  Eyes: Conjunctivae are normal.  Cardiovascular: Normal rate, regular rhythm, normal heart sounds and intact distal pulses.  No murmur heard. Pulmonary/Chest: Effort normal and breath sounds normal. No respiratory distress. She has no wheezes.  Musculoskeletal: Normal range of motion. She exhibits tenderness (Neck muscle tenderness along the right side of her neck and some on the anterior part of her throat.  She does say she had trouble swallowing last week). She exhibits no edema.       Back:       Arms: Neurological: She is  alert and oriented to person, place, and time. Coordination normal.  Skin: Skin is warm and dry. No rash noted. She is not diaphoretic.  Psychiatric: She has a normal mood and affect. Her behavior is normal.  Nursing note and vitals reviewed.   Urinalysis: 0-5 WBCs, no RBCs, 0-10 epithelial cells, few bacteria, 1+ ketone, 1+ bilirubin  Urine pregnancy negative    Assessment & Plan:   Problem List Items Addressed This Visit    None    Visit Diagnoses    Back pain, unspecified back location, unspecified back pain laterality, unspecified chronicity    -  Primary   Relevant Medications   cyclobenzaprine (FLEXERIL) 10 MG tablet   Other Relevant Orders   Urinalysis, Complete (Completed)   Pregnancy, urine (Completed)   CBC with Differential/Platelet   CMP14+EGFR   Epstein-Barr virus VCA antibody panel   Thyroid Panel With TSH   Lyme Ab/Western Blot Reflex   Rocky mtn spotted fvr abs pnl(IgG+IgM)   Alpha-Gal Panel   VITAMIN D 25 Hydroxy (Vit-D Deficiency, Fractures)   Folate   Arthritis Panel   ANA Comprehensive Panel   Neck pain       Relevant Medications   cyclobenzaprine (FLEXERIL) 10 MG tablet   Other Relevant Orders   CBC with Differential/Platelet   CMP14+EGFR   Epstein-Barr virus VCA antibody panel   Thyroid Panel With TSH   Lyme Ab/Western Blot Reflex   Rocky mtn spotted fvr abs pnl(IgG+IgM)   Alpha-Gal Panel   VITAMIN D 25 Hydroxy (Vit-D Deficiency, Fractures)   Folate   Arthritis Panel   ANA Comprehensive Panel   Myalgia       Relevant Medications   cyclobenzaprine (FLEXERIL) 10 MG tablet   Other Relevant Orders   CBC with Differential/Platelet   CMP14+EGFR   Epstein-Barr virus VCA antibody panel   Thyroid Panel With TSH   Lyme Ab/Western Blot Reflex   Rocky mtn spotted fvr abs pnl(IgG+IgM)   Alpha-Gal Panel   VITAMIN D 25 Hydroxy (Vit-D Deficiency, Fractures)   Folate   Arthritis Panel   ANA Comprehensive Panel   Other fatigue       Relevant  Medications   cyclobenzaprine (FLEXERIL) 10 MG tablet   Other Relevant Orders   CBC with Differential/Platelet   CMP14+EGFR   Epstein-Barr virus VCA antibody panel   Thyroid Panel With TSH   Lyme Ab/Western Blot Reflex   Rocky mtn spotted fvr abs pnl(IgG+IgM)   Alpha-Gal Panel   VITAMIN D 25 Hydroxy (Vit-D Deficiency, Fractures)   Folate   Arthritis Panel   ANA Comprehensive Panel       Follow up plan: Return in about 2 weeks (around 03/02/2018), or if symptoms worsen  or fail to improve, for Follow-up labs and myalgias.  Counseling provided for all of the vaccine components Orders Placed This Encounter  Procedures  . Urinalysis, Complete  . Pregnancy, urine    Caryl Pina, MD [facility] 02/16/2018, 4:26 PM

## 2018-02-20 ENCOUNTER — Ambulatory Visit (INDEPENDENT_AMBULATORY_CARE_PROVIDER_SITE_OTHER): Payer: BLUE CROSS/BLUE SHIELD | Admitting: Cardiovascular Disease

## 2018-02-20 ENCOUNTER — Encounter: Payer: Self-pay | Admitting: Cardiovascular Disease

## 2018-02-20 VITALS — BP 107/72 | HR 93 | Ht 62.0 in | Wt 121.0 lb

## 2018-02-20 DIAGNOSIS — R0789 Other chest pain: Secondary | ICD-10-CM | POA: Diagnosis not present

## 2018-02-20 NOTE — Progress Notes (Signed)
CARDIOLOGY CONSULT NOTE  Patient ID: Catherine Haney MRN: [id number] DOB/AGE: 1990-07-06 27 y.o.  Admit date: (Not on file) Primary Physician: Dettinger, Elige Radon, MD Referring Physician: Raliegh Ip, DO  Reason for Consultation: Chest pain  HPI: Catherine Haney is a 27 y.o. female who is being seen today for the evaluation of chest pain at the request of Delynn Flavin M, DO.   I reviewed notes by her PCP.  It appears she has had at least a 2-month history of intermittent chest pains that occur on both sides of her chest.  They are not associated with exertion.  They are also not associated with nausea, vomiting, dizziness, or shortness of breath.  Symptoms occur about 2-3 times per week and each episode can last up to 2 days and spontaneously resolves.  ECG performed in the office today which I ordered and personally interpreted demonstrates normal sinus rhythm with no ischemic ST segment or T-wave abnormalities, nor any arrhythmias.  She is Arabic speaking and originally from [place].  She is here with a Nurse, learning disability.  She tells me she has had symptoms on and off for the past year.  Symptoms can last all day.  She says specifically "it is from my bones and muscles ".  She denies palpitations, shortness of breath, orthopnea, paroxysmal nocturnal dyspnea, leg swelling, lightheadedness, dizziness, and syncope.  She denies dysphagia for solids and liquids.  She does admit to GERD symptoms but is not taking any medications as per personal preference as she is trying to become pregnant.  She admits to chest wall tenderness.   No Known Allergies  No current outpatient medications on file.   No current facility-administered medications for this visit.     Past Medical History:  Diagnosis Date  . GERD (gastroesophageal reflux disease)     Past Surgical History:  Procedure Laterality Date  . DILATION AND CURETTAGE OF UTERUS N/A 08/25/2017   Procedure: SUCTION  DILATATION AND CURETTAGE;  Surgeon: Lazaro Arms, MD;  Location: [facility];  Service: Gynecology;  Laterality: N/A;    Social History   Socioeconomic History  . Marital status: Married    Spouse name: Not on file  . Number of children: Not on file  . Years of education: Not on file  . Highest education level: Not on file  Occupational History  . Not on file  Social Needs  . Financial resource strain: Not on file  . Food insecurity:    Worry: Not on file    Inability: Not on file  . Transportation needs:    Medical: Not on file    Non-medical: Not on file  Tobacco Use  . Smoking status: Never Smoker  . Smokeless tobacco: Never Used  Substance and Sexual Activity  . Alcohol use: No    Alcohol/week: 0.0 standard drinks  . Drug use: No  . Sexual activity: Yes    Birth control/protection: None  Lifestyle  . Physical activity:    Days per week: Not on file    Minutes per session: Not on file  . Stress: Not on file  Relationships  . Social connections:    Talks on phone: Not on file    Gets together: Not on file    Attends religious service: Not on file    Active member of club or organization: Not on file    Attends meetings of clubs or organizations: Not on file    Relationship status: Not on file  .  Intimate partner violence:    Fear of current or ex partner: Not on file    Emotionally abused: Not on file    Physically abused: Not on file    Forced sexual activity: Not on file  Other Topics Concern  . Not on file  Social History Narrative  . Not on file     No family history of premature CAD in 1st degree relatives.  No outpatient medications have been marked as taking for the 02/20/18 encounter (Office Visit) with Laqueta Linden, MD.      Review of systems complete and found to be negative unless listed above in HPI    Physical exam Blood pressure 107/72, pulse 93, height 5\' 2"  (1.575 m), weight 121 lb (54.9 kg), SpO2 99 %. General: NAD Neck: No  JVD, no thyromegaly or thyroid nodule.  Lungs: Clear to auscultation bilaterally with normal respiratory effort. CV: Nondisplaced PMI. Regular rate and rhythm, normal S1/S2, no S3/S4, no murmur.  + right sided intercostal tenderness in 2nd and 3rd IC spaces. No peripheral edema.  No carotid bruit.    Abdomen: Soft, nontender, no distention.  Skin: Intact without lesions or rashes.  Neurologic: Alert and oriented x 3.  Psych: Normal affect. Extremities: No clubbing or cyanosis.  HEENT: Normal.   ECG: Most recent ECG reviewed.   Labs: Lab Results  Component Value Date/Time   K 4.1 02/16/2018 04:35 PM   BUN 14 02/16/2018 04:35 PM   CREATININE 0.53 (L) 02/16/2018 04:35 PM   ALT 18 02/16/2018 04:35 PM   TSH 2.210 02/16/2018 04:35 PM   HGB 12.7 02/16/2018 04:35 PM     Lipids: Lab Results  Component Value Date/Time   LDLCALC 141 (H) 08/20/2015 11:16 AM   CHOL 199 08/20/2015 11:16 AM   TRIG 56 08/20/2015 11:16 AM   HDL 47 08/20/2015 11:16 AM        ASSESSMENT AND PLAN:   1.  Chest pain: This is noncardiac in etiology and appears to be musculoskeletal in nature.  She has clear intercostal tenderness.  She also has right-sided neck tenderness.  She admits to lifting heavy objects all day as she enjoys doing so.  I told her that her symptoms may be related to this.  I recommended some physical therapy in terms of stretching and avoidance of heavy lifting.  No cardiac testing is indicated at this time.    Disposition: Follow up as needed  Signed: Prentice Docker, M.D., F.A.C.C.  02/20/2018, 9:24 AM

## 2018-02-20 NOTE — Patient Instructions (Signed)
Medication Instructions:  Your physician recommends that you continue on your current medications as directed. Please refer to the Current Medication list given to you today.   Labwork: none  Testing/Procedures: None  Follow-Up: Your physician recommends that you schedule a follow-up appointment in: as needed    Any Other Special Instructions Will Be Listed Below (If Applicable).     If you need a refill on your cardiac medications before your next appointment, please call your pharmacy.   

## 2018-02-21 LAB — CMP14+EGFR
ALBUMIN: 4.2 g/dL (ref 3.5–5.5)
ALK PHOS: 75 IU/L (ref 39–117)
ALT: 18 IU/L (ref 0–32)
AST: 23 IU/L (ref 0–40)
Albumin/Globulin Ratio: 1.4 (ref 1.2–2.2)
BUN / CREAT RATIO: 26 — AB (ref 9–23)
BUN: 14 mg/dL (ref 6–20)
CHLORIDE: 101 mmol/L (ref 96–106)
CO2: 24 mmol/L (ref 20–29)
Calcium: 9 mg/dL (ref 8.7–10.2)
Creatinine, Ser: 0.53 mg/dL — ABNORMAL LOW (ref 0.57–1.00)
GFR calc Af Amer: 149 mL/min/{1.73_m2} (ref >59)
GFR calc non Af Amer: 130 mL/min/{1.73_m2} (ref >59)
GLOBULIN, TOTAL: 3.1 g/dL (ref 1.5–4.5)
GLUCOSE: 87 mg/dL (ref 65–99)
Potassium: 4.1 mmol/L (ref 3.5–5.2)
SODIUM: 138 mmol/L (ref 134–144)
Total Protein: 7.3 g/dL (ref 6.0–8.5)

## 2018-02-21 LAB — EPSTEIN-BARR VIRUS VCA ANTIBODY PANEL
EBV NA IgG: 185 U/mL — ABNORMAL HIGH (ref 0.0–17.9)
EBV VCA IgG: 24.5 U/mL — ABNORMAL HIGH (ref 0.0–17.9)

## 2018-02-21 LAB — ANA COMPREHENSIVE PANEL
Centromere Ab Screen: <0.2 AI (ref 0.0–0.9)
Chromatin Ab SerPl-aCnc: <0.2 AI (ref 0.0–0.9)
ENA RNP Ab: <0.2 AI (ref 0.0–0.9)
ENA SM Ab Ser-aCnc: <0.2 AI (ref 0.0–0.9)
ENA SSA (RO) Ab: 0.9 AI (ref 0.0–0.9)
ENA SSB (LA) Ab: <0.2 AI (ref 0.0–0.9)

## 2018-02-21 LAB — ARTHRITIS PANEL
BASOS ABS: 0.1 10*3/uL (ref 0.0–0.2)
Basos: 1 %
EOS (ABSOLUTE): 0.2 10*3/uL (ref 0.0–0.4)
EOS: 2 %
Hematocrit: 38.1 % (ref 34.0–46.6)
Hemoglobin: 12.7 g/dL (ref 11.1–15.9)
Immature Grans (Abs): 0 10*3/uL (ref 0.0–0.1)
Immature Granulocytes: 0 %
Lymphocytes Absolute: 2.6 10*3/uL (ref 0.7–3.1)
Lymphs: 38 %
MCH: 27.9 pg (ref 26.6–33.0)
MCHC: 33.3 g/dL (ref 31.5–35.7)
MCV: 84 fL (ref 79–97)
Monocytes Absolute: 0.5 10*3/uL (ref 0.1–0.9)
Monocytes: 7 %
Neutrophils Absolute: 3.6 10*3/uL (ref 1.4–7.0)
Neutrophils: 52 %
PLATELETS: 377 10*3/uL (ref 150–450)
RBC: 4.56 x10E6/uL (ref 3.77–5.28)
RDW: 12.5 % (ref 12.3–15.4)
Rhuematoid fact SerPl-aCnc: <10 IU/mL (ref 0.0–13.9)
Sed Rate: 27 mm/hr (ref 0–32)
Uric Acid: 3.4 mg/dL (ref 2.5–7.1)
WBC: 7 10*3/uL (ref 3.4–10.8)

## 2018-02-21 LAB — ALPHA-GAL PANEL
Beef (Bos spp) IgE: <0.1 kU/L (ref <0.35)
Class Interpretation: 0
Class Interpretation: 0
Lamb/Mutton (Ovis spp) IgE: <0.1 kU/L (ref <0.35)
PORK CLASS INTERPRETATION: 0
Pork (Sus spp) IgE: <0.1 kU/L (ref <0.35)

## 2018-02-21 LAB — THYROID PANEL WITH TSH
FREE THYROXINE INDEX: 1.8 (ref 1.2–4.9)
T3 UPTAKE RATIO: 21 % — AB (ref 24–39)
T4, Total: 8.8 ug/dL (ref 4.5–12.0)
TSH: 2.21 u[IU]/mL (ref 0.450–4.500)

## 2018-02-21 LAB — ROCKY MTN SPOTTED FVR ABS PNL(IGG+IGM)
RMSF IGG: NEGATIVE
RMSF IgM: 0.33 index (ref 0.00–0.89)

## 2018-02-21 LAB — LYME AB/WESTERN BLOT REFLEX: Lyme IgG/IgM Ab: <0.91 {ISR} (ref 0.00–0.90)

## 2018-02-21 LAB — VITAMIN D 25 HYDROXY (VIT D DEFICIENCY, FRACTURES): VIT D 25 HYDROXY: 14.7 ng/mL — AB (ref 30.0–100.0)

## 2018-02-21 LAB — FOLATE: Folate: 15.4 ng/mL (ref >3.0)

## 2018-03-07 ENCOUNTER — Ambulatory Visit: Payer: BLUE CROSS/BLUE SHIELD | Admitting: Family Medicine

## 2018-03-07 ENCOUNTER — Encounter: Payer: Self-pay | Admitting: Family Medicine

## 2018-03-07 VITALS — BP 119/79 | HR 82 | Temp 97.6°F | Ht 62.0 in | Wt 124.4 lb

## 2018-03-07 DIAGNOSIS — B2700 Gammaherpesviral mononucleosis without complication: Secondary | ICD-10-CM

## 2018-03-07 DIAGNOSIS — N926 Irregular menstruation, unspecified: Secondary | ICD-10-CM

## 2018-03-07 DIAGNOSIS — M542 Cervicalgia: Secondary | ICD-10-CM | POA: Diagnosis not present

## 2018-03-07 LAB — PREGNANCY, URINE: Preg Test, Ur: NEGATIVE

## 2018-03-07 NOTE — Progress Notes (Signed)
BP 119/79   Pulse 82   Temp 97.6 F (36.4 C) (Oral)   Ht _0  (1.575 m)   Wt 124 lb 6.4 oz (56.4 kg)   LMP 02/02/2018   BMI 22.75 kg/m    Subjective:    Patient ID: Catherine Haney, female    DOB: March 07, 1991, 27 y.o.   MRN: [id number]  HPI: Catherine Haney is a 27 y.o. female presenting on 03/07/2018 for Chest Pain (2 week follow - same); Neck Pain; Referral (PT); and Possible Pregnancy   HPI Continued bilateral neck and upper shoulder pain that is muscular and tight in nature that has been going on over the past 6 months.  She has been using Flexeril off and on for this.  We did do significant amount of blood work last time and she did come back as EBV positive in the past with IgG.  This may be associated with her symptoms or may not and it could be that she is just having a prolonged mononucleosis syndrome.  She denies any numbness or weakness or pain radiating down either of her arms or down her back or down into her legs.  She is trying to get pregnant currently and she thinks she is a little bit late for menstrual cycle and that is why she stopped the Flexeril and we are doing a urine pregnancy today.  She did see the cardiologist and he did not think it was cardiac in nature.  She also continues to have this tightness in her upper chest in front of her neck as well.  Relevant past medical, surgical, family and social history reviewed and updated as indicated. Interim medical history since our last visit reviewed. Allergies and medications reviewed and updated.  Review of Systems  Constitutional: Negative for chills and fever.  Eyes: Negative for visual disturbance.  Respiratory: Negative for chest tightness and shortness of breath.   Cardiovascular: Negative for chest pain and leg swelling.  Musculoskeletal: Positive for arthralgias, myalgias and neck pain. Negative for back pain and gait problem.  Skin: Negative for rash.  Neurological: Negative for weakness, light-headedness,  numbness and headaches.  Psychiatric/Behavioral: Negative for agitation and behavioral problems.  All other systems reviewed and are negative.   Per HPI unless specifically indicated above   Allergies as of 03/07/2018   No Known Allergies     Medication List    as of 03/07/2018  2:31 PM   You have not been prescribed any medications.        Objective:    BP 119/79   Pulse 82   Temp 97.6 F (36.4 C) (Oral)   Ht _1  (1.575 m)   Wt 124 lb 6.4 oz (56.4 kg)   LMP 02/02/2018   BMI 22.75 kg/m   Wt Readings from Last 3 Encounters:  03/07/18 124 lb 6.4 oz (56.4 kg)  02/20/18 121 lb (54.9 kg)  02/16/18 122 lb 12.8 oz (55.7 kg)    Physical Exam  Constitutional: She is oriented to person, place, and time. She appears well-developed and well-nourished. No distress.  Eyes: Conjunctivae are normal.  Neck: Neck supple. No thyromegaly present.  Cardiovascular: Normal rate, regular rhythm, normal heart sounds and intact distal pulses.  No murmur heard. Pulmonary/Chest: Effort normal and breath sounds normal. No respiratory distress. She has no wheezes.  Musculoskeletal: Normal range of motion. She exhibits tenderness (Positive for tenderness to palpation on both sides of her neck in the musculature but not on midline and in  upper chest and both upper shoulders). She exhibits no edema.  Lymphadenopathy:    She has no cervical adenopathy.  Neurological: She is alert and oriented to person, place, and time. Coordination normal.  Skin: Skin is warm and dry. No rash noted. She is not diaphoretic.  Psychiatric: She has a normal mood and affect. Her behavior is normal.  Nursing note and vitals reviewed.   Results for orders placed or performed in visit on 02/16/18  Microscopic Examination  Result Value Ref Range   WBC, UA 0-5 0 - 5 /hpf   RBC, UA None seen 0 - 2 /hpf   Epithelial Cells (non renal) 0-10 0 - 10 /hpf   Mucus, UA Present (A) Not Estab.   Bacteria, UA Few None  seen/Few  Urinalysis, Complete  Result Value Ref Range   Specific Gravity, UA >1.030 (H) 1.005 - 1.030   pH, UA 5.5 5.0 - 7.5   Color, UA Yellow Yellow   Appearance Ur Clear Clear   Leukocytes, UA Negative Negative   Protein, UA Trace (A) Negative/Trace   Glucose, UA Negative Negative   Ketones, UA 1+ (A) Negative   RBC, UA 1+ (A) Negative   Bilirubin, UA Negative Negative   Urobilinogen, Ur 0.2 0.2 - 1.0 mg/dL   Nitrite, UA Negative Negative   Microscopic Examination See below:   Pregnancy, urine  Result Value Ref Range   Preg Test, Ur Negative Negative  CMP14+EGFR  Result Value Ref Range   Glucose 87 65 - 99 mg/dL   BUN 14 6 - 20 mg/dL   Creatinine, Ser 0.53 (L) 0.57 - 1.00 mg/dL   GFR calc non Af Amer 130 >59 mL/min/1.73   GFR calc Af Amer 149 >59 mL/min/1.73   BUN/Creatinine Ratio 26 (H) 9 - 23   Sodium 138 134 - 144 mmol/L   Potassium 4.1 3.5 - 5.2 mmol/L   Chloride 101 96 - 106 mmol/L   CO2 24 20 - 29 mmol/L   Calcium 9.0 8.7 - 10.2 mg/dL   Total Protein 7.3 6.0 - 8.5 g/dL   Albumin 4.2 3.5 - 5.5 g/dL   Globulin, Total 3.1 1.5 - 4.5 g/dL   Albumin/Globulin Ratio 1.4 1.2 - 2.2   Bilirubin Total <0.2 0.0 - 1.2 mg/dL   Alkaline Phosphatase 75 39 - 117 IU/L   AST 23 0 - 40 IU/L   ALT 18 0 - 32 IU/L  Epstein-Barr virus VCA antibody panel  Result Value Ref Range   EBV VCA IgM <36.0 0.0 - 35.9 U/mL   EBV Early Antigen Ab, IgG <9.0 0.0 - 8.9 U/mL   EBV VCA IgG 24.5 (H) 0.0 - 17.9 U/mL   EBV NA IgG 185.0 (H) 0.0 - 17.9 U/mL   Interpretation: Comment   Thyroid Panel With TSH  Result Value Ref Range   TSH 2.210 0.450 - 4.500 uIU/mL   T4, Total 8.8 4.5 - 12.0 ug/dL   T3 Uptake Ratio 21 (L) 24 - 39 %   Free Thyroxine Index 1.8 1.2 - 4.9  Lyme Ab/Western Blot Reflex  Result Value Ref Range   Lyme IgG/IgM Ab <0.91 0.00 - 0.90 ISR   LYME DISEASE AB, QUANT, IGM <0.80 0.00 - 0.79 index  Rocky mtn spotted fvr abs pnl(IgG+IgM)  Result Value Ref Range   RMSF IgG Negative  Negative   RMSF IgM 0.33 0.00 - 0.89 index  Alpha-Gal Panel  Result Value Ref Range   Beef (Bos spp) IgE <0.10 <0.35 kU/L  Class Interpretation 0    Lamb/Mutton (Ovis spp) IgE <0.10 <0.35 kU/L   Class Interpretation 0    Pork (Sus spp) IgE <0.10 <0.35 kU/L   Class Interpretation 0    Alpha Gal IgE* <0.10 <0.10 kU/L  VITAMIN D 25 Hydroxy (Vit-D Deficiency, Fractures)  Result Value Ref Range   Vit D, 25-Hydroxy 14.7 (L) 30.0 - 100.0 ng/mL  Folate  Result Value Ref Range   Folate 15.4 >3.0 ng/mL  Arthritis Panel  Result Value Ref Range   Uric Acid 3.4 2.5 - 7.1 mg/dL   Rhuematoid fact SerPl-aCnc <10.0 0.0 - 13.9 IU/mL   WBC 7.0 3.4 - 10.8 x10E3/uL   RBC 4.56 3.77 - 5.28 x10E6/uL   Hemoglobin 12.7 11.1 - 15.9 g/dL   Hematocrit 38.1 34.0 - 46.6 %   MCV 84 79 - 97 fL   MCH 27.9 26.6 - 33.0 pg   MCHC 33.3 31.5 - 35.7 g/dL   RDW 12.5 12.3 - 15.4 %   Platelets 377 150 - 450 x10E3/uL   Neutrophils 52 Not Estab. %   Lymphs 38 Not Estab. %   Monocytes 7 Not Estab. %   Eos 2 Not Estab. %   Basos 1 Not Estab. %   Neutrophils Absolute 3.6 1.4 - 7.0 x10E3/uL   Lymphocytes Absolute 2.6 0.7 - 3.1 x10E3/uL   Monocytes Absolute 0.5 0.1 - 0.9 x10E3/uL   EOS (ABSOLUTE) 0.2 0.0 - 0.4 x10E3/uL   Basophils Absolute 0.1 0.0 - 0.2 x10E3/uL   Immature Granulocytes 0 Not Estab. %   Immature Grans (Abs) 0.0 0.0 - 0.1 x10E3/uL   Sed Rate 27 0 - 32 mm/hr  ANA Comprehensive Panel  Result Value Ref Range   dsDNA Ab <1 0 - 9 IU/mL   ENA RNP Ab <0.2 0.0 - 0.9 AI   ENA SM Ab Ser-aCnc <0.2 0.0 - 0.9 AI   Scleroderma SCL-70 <0.2 0.0 - 0.9 AI   ENA SSA (RO) Ab 0.9 0.0 - 0.9 AI   ENA SSB (LA) Ab <0.2 0.0 - 0.9 AI   Chromatin Ab SerPl-aCnc <0.2 0.0 - 0.9 AI   Anti JO-1 <0.2 0.0 - 0.9 AI   Centromere Ab Screen <0.2 0.0 - 0.9 AI   See below: Comment    Urine pregnancy negative    Assessment & Plan:   Problem List Items Addressed This Visit    None    Visit Diagnoses    EBV positive mononucleosis  syndrome    -  Primary   Missed menses       Relevant Orders   Pregnancy, urine   Neck pain       Relevant Orders   Ambulatory referral to Physical Therapy      Recommend blue ice and physical therapy and heating pads, patient is trying to get pregnant so we are going to keep the medication at minimal.  All of her symptoms could be related to an EBV mononucleosis syndrome which she should start to be getting over as its been 6 or 7 months and she has had it. Follow up plan: Return if symptoms worsen or fail to improve.  Counseling provided for all of the vaccine components Orders Placed This Encounter  Procedures  . Pregnancy, urine  . Ambulatory referral to Physical Therapy    Caryl Pina, MD [facility] 03/07/2018, 2:31 PM

## 2018-03-12 ENCOUNTER — Encounter: Payer: Self-pay | Admitting: Family Medicine

## 2018-03-12 ENCOUNTER — Ambulatory Visit: Payer: BLUE CROSS/BLUE SHIELD | Admitting: Family Medicine

## 2018-03-12 VITALS — BP 97/59 | HR 80 | Temp 97.4°F | Ht 62.0 in | Wt 124.6 lb

## 2018-03-12 DIAGNOSIS — J020 Streptococcal pharyngitis: Secondary | ICD-10-CM

## 2018-03-12 MED ORDER — AMOXICILLIN 500 MG PO CAPS: 500.0000 mg | ORAL_CAPSULE | Freq: Two times a day (BID) | ORAL | 0 refills | Status: DC

## 2018-03-12 NOTE — Progress Notes (Signed)
BP (!) 97/59   Pulse 80   Temp (!) 97.4 F (36.3 C) (Oral)   Ht 5\' 2"  (1.575 m)   Wt 124 lb 9.6 oz (56.5 kg)   BMI 22.79 kg/m    Subjective:    Patient ID: Catherine Haney, female    DOB: December 25, 1990, 27 y.o.   MRN: [id number]  HPI: Catherine Haney is a 27 y.o. female presenting on 03/12/2018 for No chief complaint on file.   HPI Sore throat and cough and congestion Patient comes in with sore throat and cough and congestion that has been going on for the past 3 days.  She says her child has been ill prior to her and she feels like she has the same thing.  He has had high fevers and she has felt febrile but has not had as high fevers.  She has felt chills and a sore throat and congestion and ear pain and pressure as well.  Tylenol and ibuprofen just like she has been using for him but it seems to be worsening.  Relevant past medical, surgical, family and social history reviewed and updated as indicated. Interim medical history since our last visit reviewed. Allergies and medications reviewed and updated.  Review of Systems  Constitutional: Positive for chills. Negative for fever.  HENT: Positive for congestion, ear pain, postnasal drip, rhinorrhea, sinus pressure and sore throat. Negative for ear discharge and sneezing.   Eyes: Negative for visual disturbance.  Respiratory: Positive for cough. Negative for chest tightness, shortness of breath and wheezing.   Cardiovascular: Negative for chest pain and leg swelling.  Genitourinary: Negative for difficulty urinating and dysuria.  Musculoskeletal: Positive for myalgias. Negative for back pain and gait problem.  Skin: Negative for rash.  Neurological: Negative for light-headedness and headaches.  Psychiatric/Behavioral: Negative for agitation and behavioral problems.  All other systems reviewed and are negative.   Per HPI unless specifically indicated above   Allergies as of 03/12/2018   No Known Allergies     Medication List        Accurate as of 03/12/18  3:53 PM. Always use your most recent med list.          amoxicillin 500 MG capsule Commonly known as:  AMOXIL Take 1 capsule (500 mg total) by mouth 2 (two) times daily.          Objective:    There were no vitals taken for this visit.  Wt Readings from Last 3 Encounters:  03/07/18 124 lb 6.4 oz (56.4 kg)  02/20/18 121 lb (54.9 kg)  02/16/18 122 lb 12.8 oz (55.7 kg)    Physical Exam  Constitutional: She is oriented to person, place, and time. She appears well-developed and well-nourished. No distress.  HENT:  Right Ear: Tympanic membrane, external ear and ear canal normal.  Left Ear: Tympanic membrane, external ear and ear canal normal.  Nose: Mucosal edema and rhinorrhea present. No epistaxis. Right sinus exhibits no maxillary sinus tenderness and no frontal sinus tenderness. Left sinus exhibits no maxillary sinus tenderness and no frontal sinus tenderness.  Mouth/Throat: Uvula is midline and mucous membranes are normal. Posterior oropharyngeal edema and posterior oropharyngeal erythema present. No oropharyngeal exudate or tonsillar abscesses.  Eyes: Conjunctivae and EOM are normal.  Cardiovascular: Normal rate, regular rhythm, normal heart sounds and intact distal pulses.  No murmur heard. Pulmonary/Chest: Effort normal and breath sounds normal. No respiratory distress. She has no wheezes.  Musculoskeletal: Normal range of motion. She exhibits no edema or  tenderness.  Neurological: She is alert and oriented to person, place, and time. Coordination normal.  Skin: Skin is warm and dry. No rash noted. She is not diaphoretic.  Psychiatric: She has a normal mood and affect. Her behavior is normal.  Vitals reviewed.       Assessment & Plan:   Problem List Items Addressed This Visit    None    Visit Diagnoses    Strep pharyngitis    -  Primary       Follow up plan: Return if symptoms worsen or fail to improve.  Counseling provided for  all of the vaccine components No orders of the defined types were placed in this encounter.   Arville Care, MD [facility] 03/12/2018, 3:53 PM

## 2018-03-14 ENCOUNTER — Other Ambulatory Visit: Payer: Self-pay

## 2018-03-14 ENCOUNTER — Encounter: Payer: Self-pay | Admitting: Physical Therapy

## 2018-03-14 ENCOUNTER — Ambulatory Visit: Payer: BLUE CROSS/BLUE SHIELD | Attending: Family Medicine | Admitting: Physical Therapy

## 2018-03-14 DIAGNOSIS — M6281 Muscle weakness (generalized): Secondary | ICD-10-CM

## 2018-03-14 DIAGNOSIS — R293 Abnormal posture: Secondary | ICD-10-CM | POA: Insufficient documentation

## 2018-03-14 DIAGNOSIS — M542 Cervicalgia: Secondary | ICD-10-CM | POA: Diagnosis present

## 2018-03-14 NOTE — Patient Instructions (Signed)
SCAPULAR RETRACTIONS Draw your shoulder blades back and down.               Marland Kitchen  RETRACTION / CHIN TUCK Slowly draw your head back so that your ears line up with your shoulders.    /            TRANSVERSE ABDOMINAL ACTIVATION TRAINING Place 2 fingers at either side of your abdomen area at height of your naval but wide enough apart that you are NOT on top of the rectus abdominis (6-pack muscles).   Sink your fingers in gently with a relaxed body. Next, tighten your transverse abdominal muscle by performing a bracing technique (tighten your stomach as if someone was going to punch you in the stomach). If performed correctly, your fingers will be pushed outward. Hold, then relax and repeat.      2                       (6- ).       . ,            (           ).           .     .

## 2018-03-14 NOTE — Therapy (Addendum)
[facility] [address] [place], [place], [address] Phone: [phone]   Fax:  [phone]  Physical Therapy Evaluation  Patient Details  Name: Catherine Haney MRN: [id number] Date of Birth: 10/31/1990 Referring Provider (PT): Arville Care, MD   Encounter Date: 03/14/2018  PT End of Session - 03/14/18 1312    Visit Number  1    Number of Visits  12    Date for PT Re-Evaluation  05/02/18    Authorization Type  Progress note every 10th visit    PT Start Time  1035    PT Stop Time  1115    PT Time Calculation (min)  40 min    Activity Tolerance  Patient limited by pain    Behavior During Therapy  [facility] for tasks assessed/performed       Past Medical History:  Diagnosis Date  . GERD (gastroesophageal reflux disease)     Past Surgical History:  Procedure Laterality Date  . DILATION AND CURETTAGE OF UTERUS N/A 08/25/2017   Procedure: SUCTION DILATATION AND CURETTAGE;  Surgeon: Lazaro Arms, MD;  Location: [facility];  Service: Gynecology;  Laterality: N/A;    There were no vitals filed for this visit.   Subjective Assessment - 03/14/18 2152    Subjective  Patient arrives to physical therapy with language interpreter with reports of neck, shoulder, and  right sternoclavicular pain that began about a year ago. Patient is unsure of cause of pain but stated possible infection (Epstien-Barr Virus Mononucleosis per medical chart) maybe a cause for pain per MD. Patient reports pain increases throughout the day, right arm movements especially flexion can cause an increase in right cervical and UT pain. Patient states right sternoclavicular pain increases with forced exhalation. Patient reports pain is limiting her ability to perform household activities as well as care for her children (ages 56 and 4). Patient reports pain at worst is 8/10 and describes her pain as her "bones are about to break." Patient reports pain at best is 5/10 with rest.   Patient's goals are to decrease pain, improve movement, and improve ability to perform household activities and care for children.     Patient is accompained by:  Interpreter   [employer] hold activities;Lifting    Patient Stated Goals  decrease pain and move better    Currently in Pain?  Yes    Pain Score  8     Pain Location  Neck    Pain Orientation  Posterior    Pain Descriptors / Indicators  Aching;Sore    Pain Type  Chronic pain    Pain Onset  More than a month ago    Pain Frequency  Constant    Aggravating Factors   increased movement    Pain Relieving Factors  rest         [facility] PT Assessment - 03/14/18 0001      Assessment   Medical Diagnosis  Neck pain    Referring Provider (PT)  Arville Care, MD    Onset Date/Surgical Date  --   one year   Hand Dominance  Right    Prior Therapy  no      Precautions   Precautions  None      Restrictions   Weight Bearing Restrictions  No      Balance Screen   Has the patient fallen in the past 6 months  No      Home Environment   Living  Environment  Private residence    Living Arrangements  Spouse/significant other;Children      Prior Function   Level of Independence  Independent      Posture/Postural Control   Posture/Postural Control  Postural limitations    Postural Limitations  Forward head;Decreased lumbar lordosis;Posterior pelvic tilt;Flexed trunk;Rounded Shoulders   in sitting     ROM / Strength   AROM / PROM / Strength  AROM;Strength      AROM   Overall AROM Comments  shoulder AROM WFL but reported with pain    AROM Assessment Site  Shoulder;Cervical    Cervical Flexion  50    Cervical Extension  30    Cervical - Right Side Bend  29    Cervical - Left Side Bend  35    Cervical - Right Rotation  55    Cervical - Left Rotation  56      Strength   Overall Strength  Due to pain    Strength Assessment Site  Shoulder    Right/Left Shoulder  Right;Left    Right Shoulder Flexion  3+/5     Right Shoulder ABduction  3+/5    Right Shoulder Internal Rotation  3+/5    Right Shoulder External Rotation  3+/5    Left Shoulder Flexion  3+/5    Left Shoulder ABduction  3+/5    Left Shoulder Internal Rotation  3+/5    Left Shoulder External Rotation  3+/5      Palpation   Palpation comment  very tender to palpation to right sternoclavicular joint, cervical paraspinals, bilateral UTs, right lateral arm and right sternoclavicular joint                Objective measurements completed on examination: See above findings.      [facility] Adult PT Treatment/Exercise - 03/14/18 0001      Exercises   Exercises  --             PT Education - 03/14/18 1311    Education Details  scapular retractions, chin tucks, transverse abdominis activiation    Person(s) Educated  Patient    Methods  Explanation;Demonstration;Handout    Comprehension  Verbalized understanding;Returned demonstration          PT Long Term Goals - 03/14/18 2132      PT LONG TERM GOAL #1   Title  Patient will be independent with HEP    Time  6    Period  Weeks    Status  New      PT LONG TERM GOAL #2   Title  Patient will report ability to peform ADLs and caregiving tasks with neck pain less than or equal to 3/10.    Time  6    Period  Weeks    Status  New      PT LONG TERM GOAL #3   Title  Patient will demonstrate 4/5 bilateral shoulder MMT to improve stability during functional tasks.     Time  6    Period  Weeks    Status  New             Plan - 03/14/18 2154    Clinical Impression Statement  Patient is a 27 year old female who presents to physical therapy with ongoing cervical, shoulder, and upper thoracic pain. Patient noted with [facility] cervical AROM despite reports of increased pain with all motions. Patient's shoulder AROM WFL and noted with fair plus shoulder MMT bilaterally. Patient very  tender to palpation to lower cervical paraspinals,  bilateral UTs, medial borders of the  bilateral scapulae, right sternoclavicular joint as well as right lateral arm. Patient noted with forward head, rounded shoulders and decreased lumbar lordosis in sitting. Patient would benefit from skilled physical therapy to address deficits and address patient's goals.     Clinical Presentation  Evolving    Clinical Presentation due to:  not improving    Clinical Decision Making  Low    Rehab Potential  Good    PT Frequency  2x / week    PT Duration  6 weeks    PT Treatment/Interventions  ADLs/Self Care Home Management;Iontophoresis 4mg /ml Dexamethasone;Moist Heat;Ultrasound;Traction;Cryotherapy;Electrical Stimulation;Therapeutic exercise;Therapeutic activities;Neuromuscular re-education;Passive range of motion;Manual techniques;Dry needling;Patient/family education;Taping    PT Next Visit Plan  gentle AROM, postural exercises, STW/M, modalities PRN for pain relief    Consulted and Agree with Plan of Care  Patient       Patient will benefit from skilled therapeutic intervention in order to improve the following deficits and impairments:  Pain, Postural dysfunction, Decreased strength, Impaired UE functional use, Decreased activity tolerance, Decreased endurance  Visit Diagnosis: Cervicalgia  Abnormal posture  Muscle weakness (generalized)     Problem List Patient Active Problem List   Diagnosis Date Noted  . Atypical chest pain 10/17/2017  . Missed abortion 08/09/2017  . Yeast infection 01/19/2017  . Vitamin D deficiency 08/27/2015  . Hyperlipidemia 08/27/2015  . GERD (gastroesophageal reflux disease) 08/20/2015  . Generalized anxiety disorder 07/24/2015  . Other seasonal allergic rhinitis 03/18/2015  . Chronic pain of multiple joints 03/18/2015   Guss Bunde, PT, DPT 03/14/2018, 10:11 PM  [facility] [address] [place], [place], [address] Phone: [phone]   Fax:  [phone]  Name: Callista Hoh MRN:  [id number] Date of Birth: 1990-10-07

## 2018-03-14 NOTE — Addendum Note (Signed)
Addended by: Guss Bunde on: 03/14/2018 10:14 PM   Modules accepted: Orders

## 2018-03-20 ENCOUNTER — Ambulatory Visit: Payer: BLUE CROSS/BLUE SHIELD | Admitting: Physical Therapy

## 2018-03-20 DIAGNOSIS — M542 Cervicalgia: Secondary | ICD-10-CM

## 2018-03-20 DIAGNOSIS — M6281 Muscle weakness (generalized): Secondary | ICD-10-CM

## 2018-03-20 DIAGNOSIS — R293 Abnormal posture: Secondary | ICD-10-CM

## 2018-03-20 NOTE — Therapy (Addendum)
[facility] [address] [place], [place], [address] Phone: [phone]   Fax:  [phone]  Physical Therapy Treatment  Patient Details  Name: Catherine Haney MRN: [id number] Date of Birth: [birth year] Referring Provider (PT): Arville Care, MD   Encounter Date: 03/20/2018  PT End of Session - 03/20/18 1109    Visit Number  2    Number of Visits  12    Date for PT Re-Evaluation  05/02/18    Authorization Type  Progress note every 10th visit    PT Start Time  1030    PT Stop Time  1118    PT Time Calculation (min)  48 min    Activity Tolerance  Patient limited by pain    Behavior During Therapy  [facility] for tasks assessed/performed       Past Medical History:  Diagnosis Date  . GERD (gastroesophageal reflux disease)     Past Surgical History:  Procedure Laterality Date  . DILATION AND CURETTAGE OF UTERUS N/A 08/25/2017   Procedure: SUCTION DILATATION AND CURETTAGE;  Surgeon: Lazaro Arms, MD;  Location: [facility];  Service: Gynecology;  Laterality: N/A;    There were no vitals filed for this visit.  Subjective Assessment - 03/20/18 1031    Subjective  Patient reports feeling the same and has been performing HEPs but noted with some increase of pain.     Patient is accompained by:  Interpreter   [employer] hold activities;Lifting    Patient Stated Goals  decrease pain and move better    Currently in Pain?  Yes    Pain Score  7     Pain Location  Neck    Pain Orientation  Posterior    Pain Descriptors / Indicators  Numbness    Pain Type  Chronic pain    Pain Radiating Towards  R UT and proximal shoulder    Pain Onset  More than a month ago    Pain Frequency  Constant                       [facility] Adult PT Treatment/Exercise - 03/20/18 0001      Exercises   Exercises  Neck      Neck Exercises: Seated   Neck Retraction  20 reps;5 secs    Cervical Rotation  Both;10 reps   3 second hold   Shoulder Rolls  Backwards;20 reps      Manual Therapy   Manual Therapy  Soft tissue mobilization;Passive ROM    Soft tissue mobilization  STW/M to R UT and deltoids to reduce pain and muscle tone    Passive ROM  PROM of cervical spine lat. flexion and PROM of R shoulder in flexion and ER to reduce stiffness followed by gentle levator scapulae stretch and UT stretch        Modalities: moist heat pack to cervical spine for 15 minutes; Pre-mod to R UT at 80-150 hz x15 mins to reduce pain and tone          PT Long Term Goals - 03/14/18 2132      PT LONG TERM GOAL #1   Title  Patient will be independent with HEP    Time  6    Period  Weeks    Status  New      PT LONG TERM GOAL #2   Title  Patient will report ability to peform ADLs and caregiving tasks with  neck pain less than or equal to 3/10.    Time  6    Period  Weeks    Status  New      PT LONG TERM GOAL #3   Title  Patient will demonstrate 4/5 bilateral shoulder MMT to improve stability during functional tasks.     Time  6    Period  Weeks    Status  New            Plan - 03/20/18 1109    Clinical Impression Statement  Patient was able to tolerate treatment fairly. Patient noted with intermittent facial grimmaces as she went through exercies. Patient instructed to begin gently and progress as tolerated. Patient was able to tolerate STW/M and gentle PROM and stretching of her cervical spine. Patient noted with pain in right shoulder during PROM flexion and noted it as "numbness". Patient noted with no adverse affects upon removal of modalities.     Clinical Presentation  Evolving    Clinical Decision Making  Low    Rehab Potential  Good    PT Frequency  2x / week    PT Duration  6 weeks    PT Treatment/Interventions  ADLs/Self Care Home Management;Iontophoresis 4mg /ml Dexamethasone;Moist Heat;Ultrasound;Traction;Cryotherapy;Electrical Stimulation;Therapeutic exercise;Therapeutic activities;Neuromuscular  re-education;Passive range of motion;Manual techniques;Dry needling;Patient/family education;Taping    PT Next Visit Plan  gentle AROM, postural exercises, STW/M, modalities PRN for pain relief    Consulted and Agree with Plan of Care  Patient       Patient will benefit from skilled therapeutic intervention in order to improve the following deficits and impairments:  Pain, Postural dysfunction, Decreased strength, Impaired UE functional use, Decreased activity tolerance, Decreased endurance  Visit Diagnosis: Cervicalgia  Abnormal posture  Muscle weakness (generalized)     Problem List Patient Active Problem List   Diagnosis Date Noted  . Atypical chest pain 10/17/2017  . Missed abortion 08/09/2017  . Yeast infection 01/19/2017  . Vitamin D deficiency 08/27/2015  . Hyperlipidemia 08/27/2015  . GERD (gastroesophageal reflux disease) 08/20/2015  . Generalized anxiety disorder 07/24/2015  . Other seasonal allergic rhinitis 03/18/2015  . Chronic pain of multiple joints 03/18/2015   Guss Bunde, PT, DPT 03/20/2018, 11:19 AM  [facility] [address] [place], [place], [address] Phone: [phone]   Fax:  [phone]  Name: Catherine Haney MRN: [id number] Date of Birth: March 30, 1991

## 2018-03-21 ENCOUNTER — Ambulatory Visit (INDEPENDENT_AMBULATORY_CARE_PROVIDER_SITE_OTHER): Payer: BLUE CROSS/BLUE SHIELD | Admitting: Adult Health

## 2018-03-21 ENCOUNTER — Encounter: Payer: Self-pay | Admitting: Adult Health

## 2018-03-21 VITALS — BP 105/73 | HR 88 | Ht 62.0 in | Wt 124.4 lb

## 2018-03-21 DIAGNOSIS — Z319 Encounter for procreative management, unspecified: Secondary | ICD-10-CM | POA: Diagnosis not present

## 2018-03-21 DIAGNOSIS — N926 Irregular menstruation, unspecified: Secondary | ICD-10-CM

## 2018-03-21 DIAGNOSIS — R232 Flushing: Secondary | ICD-10-CM | POA: Diagnosis not present

## 2018-03-21 NOTE — Progress Notes (Signed)
  Subjective:     Patient ID: Catherine Haney, female   DOB: 1991/01/09, 27 y.o.   MRN: [id number]  HPI Catherine Haney is a 27 year old Arabic female in complaining or irregular periods and wants to be get pregnant. PCP is Dr Louanne Skye.   Review of Systems Periods irregular for last 6 months, may skip periods, then will bleed for 1 day and then 2 weeks later may bleed 1 day +cramps at times  +hot flashes Wants to be pregnant Reviewed past medical,surgical, social and family history. Reviewed medications and allergies.     Objective:   Physical Exam BP 105/73 (BP Location: Left Arm, Patient Position: Sitting, Cuff Size: Normal)   Pulse 88   Ht 5\' 2"  (1.575 m)   Wt 124 lb 6.4 oz (56.4 kg)   LMP 03/09/2018   BMI 22.75 kg/m  Skin warm and dry. Neck: mid line trachea, normal thyroid, good ROM, no lymphadenopathy noted. Lungs: clear to ausculation bilaterally. Cardiovascular: regular rate and rhythm. Has arabic interpreter, Dione Housekeeper with her. Will get labs and [place], then have her return for results and options.  Facet time 15 minutes with 50% counseling and coordinating care.    Assessment:     1. Irregular periods   2. Hot flashes   3. Patient desires pregnancy       Plan:     Check CBC,FSH,Prolactin,LH and estradiol Return in 1 week for gyn [place] and then see me in 2 weeks later

## 2018-03-22 ENCOUNTER — Encounter: Payer: Self-pay | Admitting: Physical Therapy

## 2018-03-22 ENCOUNTER — Ambulatory Visit: Payer: BLUE CROSS/BLUE SHIELD | Admitting: Physical Therapy

## 2018-03-22 DIAGNOSIS — R293 Abnormal posture: Secondary | ICD-10-CM

## 2018-03-22 DIAGNOSIS — M542 Cervicalgia: Secondary | ICD-10-CM | POA: Diagnosis not present

## 2018-03-22 DIAGNOSIS — M6281 Muscle weakness (generalized): Secondary | ICD-10-CM

## 2018-03-22 LAB — CBC
HEMATOCRIT: 37.9 % (ref 34.0–46.6)
HEMOGLOBIN: 12.9 g/dL (ref 11.1–15.9)
MCH: 28.9 pg (ref 26.6–33.0)
MCHC: 34 g/dL (ref 31.5–35.7)
MCV: 85 fL (ref 79–97)
Platelets: 412 10*3/uL (ref 150–450)
RBC: 4.47 x10E6/uL (ref 3.77–5.28)
RDW: 12.6 % (ref 12.3–15.4)
WBC: 6.2 10*3/uL (ref 3.4–10.8)

## 2018-03-22 LAB — PROLACTIN: Prolactin: 11.9 ng/mL (ref 4.8–23.3)

## 2018-03-22 LAB — ESTRADIOL: Estradiol: 65.7 pg/mL

## 2018-03-22 LAB — LUTEINIZING HORMONE: LH: 12.8 m[IU]/mL

## 2018-03-22 LAB — FOLLICLE STIMULATING HORMONE: FSH: 5.8 m[IU]/mL

## 2018-03-22 NOTE — Therapy (Signed)
[facility] [address] [place], [place], [address] Phone: [phone]   Fax:  [phone]  Physical Therapy Treatment  Patient Details  Name: Catherine Haney MRN: [id number] Date of Birth: [birth year] Referring Provider (PT): Arville Care, MD   Encounter Date: 03/22/2018  PT End of Session - 03/22/18 1105    Visit Number  3    Number of Visits  12    Date for PT Re-Evaluation  05/02/18    Authorization Type  Progress note every 10th visit    PT Start Time  1035    PT Stop Time  1118    PT Time Calculation (min)  43 min    Activity Tolerance  Patient limited by pain;Patient tolerated treatment well    Behavior During Therapy  [facility] for tasks assessed/performed       Past Medical History:  Diagnosis Date  . GERD (gastroesophageal reflux disease)     Past Surgical History:  Procedure Laterality Date  . DILATION AND CURETTAGE OF UTERUS N/A 08/25/2017   Procedure: SUCTION DILATATION AND CURETTAGE;  Surgeon: Lazaro Arms, MD;  Location: [facility];  Service: Gynecology;  Laterality: N/A;    There were no vitals filed for this visit.  Subjective Assessment - 03/22/18 1035    Subjective  Patient reports pain is "better."    Limitations  House hold activities;Lifting    Patient Stated Goals  decrease pain and move better    Currently in Pain?  Yes   did not provide pain scale number                      [facility] Adult PT Treatment/Exercise - 03/22/18 0001      Exercises   Exercises  Neck      Neck Exercises: Seated   Neck Retraction  20 reps;5 secs    Cervical Rotation  Both;20 reps   flexion and extension     Modalities   Modalities  Electrical Stimulation      Moist Heat Therapy   Number Minutes Moist Heat  15 Minutes    Moist Heat Location  Cervical      Electrical Stimulation   Electrical Stimulation Location  R UT     Electrical Stimulation Action  pre-mod    Electrical Stimulation Parameters   80-150 hz x 15    Electrical Stimulation Goals  Pain;Tone      Manual Therapy   Manual Therapy  Soft tissue mobilization;Passive ROM    Soft tissue mobilization  STW/M to R UT and deltoids to reduce pain and muscle tone    Passive ROM  PROM of cervical spine lat. flexion and PROM of R shoulder in flexion and ER to reduce stiffness followed by gentle levator scapulae stretch and UT stretch      Neck Exercises: Stretches   Upper Trapezius Stretch  3 reps;30 seconds;Right;Left    Levator Stretch  3 reps;30 seconds;Right;Left    Chest Stretch  3 reps    Chest Stretch Limitations  30 seconds                  PT Long Term Goals - 03/14/18 2132      PT LONG TERM GOAL #1   Title  Patient will be independent with HEP    Time  6    Period  Weeks    Status  New      PT LONG TERM GOAL #2   Title  Patient  will report ability to peform ADLs and caregiving tasks with neck pain less than or equal to 3/10.    Time  6    Period  Weeks    Status  New      PT LONG TERM GOAL #3   Title  Patient will demonstrate 4/5 bilateral shoulder MMT to improve stability during functional tasks.     Time  6    Period  Weeks    Status  New            Plan - 03/22/18 1106    Clinical Impression Statement  Patient was able to tolerate treatment fairly well. Patient continues to demonstrate intermittent facial grimmaces with exercises but less than last visit. Gentle STW/M to right steroclavicular joint and along right clavicle. Patient noted with decreased right UT tone but noted with increased discomfort with deeper STW/M. Patient noted with improved right shoulder flexion PROM with reports of "a little bit of pain". Normal response to modalities at end of session.     Clinical Presentation  Evolving    Clinical Decision Making  Low    Rehab Potential  Good    PT Frequency  2x / week    PT Duration  6 weeks    PT Treatment/Interventions  ADLs/Self Care Home Management;Iontophoresis 4mg /ml  Dexamethasone;Moist Heat;Ultrasound;Traction;Cryotherapy;Electrical Stimulation;Therapeutic exercise;Therapeutic activities;Neuromuscular re-education;Passive range of motion;Manual techniques;Dry needling;Patient/family education;Taping    PT Next Visit Plan  gentle AROM, postural exercises, STW/M, modalities PRN for pain relief    Consulted and Agree with Plan of Care  Patient       Patient will benefit from skilled therapeutic intervention in order to improve the following deficits and impairments:  Pain, Postural dysfunction, Decreased strength, Impaired UE functional use, Decreased activity tolerance, Decreased endurance  Visit Diagnosis: Cervicalgia  Abnormal posture  Muscle weakness (generalized)     Problem List Patient Active Problem List   Diagnosis Date Noted  . Atypical chest pain 10/17/2017  . Missed abortion 08/09/2017  . Yeast infection 01/19/2017  . Vitamin D deficiency 08/27/2015  . Hyperlipidemia 08/27/2015  . GERD (gastroesophageal reflux disease) 08/20/2015  . Generalized anxiety disorder 07/24/2015  . Other seasonal allergic rhinitis 03/18/2015  . Chronic pain of multiple joints 03/18/2015   Guss Bunde, PT, DPT 03/22/2018, 11:30 AM  [facility] [address] [place], [place], [address] Phone: [phone]   Fax:  [phone]  Name: Catherine Haney MRN: [id number] Date of Birth: [birth year]

## 2018-03-27 ENCOUNTER — Ambulatory Visit: Payer: BLUE CROSS/BLUE SHIELD | Attending: Family Medicine | Admitting: Physical Therapy

## 2018-03-27 DIAGNOSIS — M6281 Muscle weakness (generalized): Secondary | ICD-10-CM | POA: Insufficient documentation

## 2018-03-27 DIAGNOSIS — M542 Cervicalgia: Secondary | ICD-10-CM | POA: Diagnosis present

## 2018-03-27 DIAGNOSIS — R293 Abnormal posture: Secondary | ICD-10-CM | POA: Diagnosis present

## 2018-03-27 NOTE — Patient Instructions (Signed)
Side Stretch    Lie on your back with your shoulders to the left and hips to the right. Move your straight left leg to the left and then try to bring the right leg to meet it. Stop when you feel a stretch in the right low back. Hold 1-5 min. 1-2 x/day.  You DO NOT have to raise arm overhead as shown.   Solon Palm, PT 03/27/18 11:11 AM [facility] [address] [place], [place], [address] Phone: [phone]   Fax:  [phone]

## 2018-03-27 NOTE — Therapy (Signed)
[facility] [address] [place], [place], [address] Phone: [phone]   Fax:  [phone]  Physical Therapy Treatment  Patient Details  Name: Verma Grothaus MRN: [id number] Date of Birth: 1991-02-11 Referring Provider (PT): Arville Care, MD   Encounter Date: 03/27/2018  PT End of Session - 03/27/18 1040    Visit Number  4    Number of Visits  12    Date for PT Re-Evaluation  05/02/18    Authorization Type  Progress note every 10th visit    PT Start Time  1040    PT Stop Time  1131    PT Time Calculation (min)  51 min    Activity Tolerance  Patient limited by pain;Patient tolerated treatment well    Behavior During Therapy  [facility] for tasks assessed/performed       Past Medical History:  Diagnosis Date  . GERD (gastroesophageal reflux disease)     Past Surgical History:  Procedure Laterality Date  . DILATION AND CURETTAGE OF UTERUS N/A 08/25/2017   Procedure: SUCTION DILATATION AND CURETTAGE;  Surgeon: Lazaro Arms, MD;  Location: [facility];  Service: Gynecology;  Laterality: N/A;    There were no vitals filed for this visit.  Subjective Assessment - 03/27/18 1040    Subjective  Patient states her neck is feeling better but still has pain at costochondral junction around rib 3 on R.    Patient is accompained by:  Interpreter    Patient Stated Goals  decrease pain and move better    Currently in Pain?  Yes    Pain Score  5     Pain Location  Neck    Pain Orientation  Posterior    Pain Descriptors / Indicators  --   stiffness                      [facility] Adult PT Treatment/Exercise - 03/27/18 0001      Exercises   Exercises  Neck      Neck Exercises: Supine   Other Supine Exercise  R QL stretch in supine (shoulders and feet to left/hips right) x 1 min      Modalities   Modalities  Electrical Stimulation;Moist Heat      Moist Heat Therapy   Number Minutes Moist Heat  15 Minutes    Moist Heat Location   Cervical      Electrical Stimulation   Electrical Stimulation Location  R UT and ant shoulder    Electrical Stimulation Action  premod    Electrical Stimulation Parameters  80-150 Hz x 15 min    Electrical Stimulation Goals  Pain      Manual Therapy   Manual Therapy  Soft tissue mobilization;Myofascial release;Joint mobilization    Manual therapy comments  prone    Joint Mobilization  PA mobs to cervical, thoracic and lumbar spine gdII/III    Soft tissue mobilization  to R UT/levator scap/scalenes/pectorals up to sternum    Myofascial Release  TPR to scalenes with deep breathing by patient in supine; TPR to levator in prone      Neck Exercises: Stretches   Corner Stretch Limitations  caused tingling into RUE    Chest Stretch  2 reps;20 seconds   doorway   Other Neck Stretches  ant neck stretch too painful             PT Education - 03/27/18 1248    Education Details  HEP - QL stretch,  MFR with tennis ball to chest wall    Person(s) Educated  Patient;Other (comment)    Methods  Explanation;Demonstration;Handout;Tactile cues;Verbal cues    Comprehension  Verbalized understanding;Returned demonstration          PT Long Term Goals - 03/14/18 2132      PT LONG TERM GOAL #1   Title  Patient will be independent with HEP    Time  6    Period  Weeks    Status  New      PT LONG TERM GOAL #2   Title  Patient will report ability to peform ADLs and caregiving tasks with neck pain less than or equal to 3/10.    Time  6    Period  Weeks    Status  New      PT LONG TERM GOAL #3   Title  Patient will demonstrate 4/5 bilateral shoulder MMT to improve stability during functional tasks.     Time  6    Period  Weeks    Status  New            Plan - 03/27/18 1249    Clinical Impression Statement  Patient reports her neck is feeling better overall, but she still has marked tightness in R scalenes, UT, and levator scapula. She also grimmaced with pain with IR of R  shoulder. She had some pain with PA mobs to R cervical, thoracic and left lumbar. She tolerated deep MFR to neck muscles fairly well. She did report some RUE tingling with doorway stretch for pecs.    PT Treatment/Interventions  ADLs/Self Care Home Management;Iontophoresis 4mg /ml Dexamethasone;Moist Heat;Ultrasound;Traction;Cryotherapy;Electrical Stimulation;Therapeutic exercise;Therapeutic activities;Neuromuscular re-education;Passive range of motion;Manual techniques;Dry needling;Patient/family education;Taping    PT Next Visit Plan  gentle AROM, postural exercises, STW/M, modalities PRN for pain relief    PT Home Exercise Plan  QL stretch and self MFR       Patient will benefit from skilled therapeutic intervention in order to improve the following deficits and impairments:  Pain, Postural dysfunction, Decreased strength, Impaired UE functional use, Decreased activity tolerance, Decreased endurance  Visit Diagnosis: Cervicalgia  Abnormal posture     Problem List Patient Active Problem List   Diagnosis Date Noted  . Atypical chest pain 10/17/2017  . Missed abortion 08/09/2017  . Yeast infection 01/19/2017  . Vitamin D deficiency 08/27/2015  . Hyperlipidemia 08/27/2015  . GERD (gastroesophageal reflux disease) 08/20/2015  . Generalized anxiety disorder 07/24/2015  . Other seasonal allergic rhinitis 03/18/2015  . Chronic pain of multiple joints 03/18/2015    Veer Elamin PT 03/27/2018, 12:55 PM  [facility] [address] [place], [place], [address] Phone: [phone]   Fax:  [phone]  Name: Jerolyn Flenniken MRN: [id number] Date of Birth: 01-Nov-1990

## 2018-03-28 ENCOUNTER — Telehealth: Payer: Self-pay | Admitting: *Deleted

## 2018-03-28 ENCOUNTER — Ambulatory Visit (INDEPENDENT_AMBULATORY_CARE_PROVIDER_SITE_OTHER): Payer: BLUE CROSS/BLUE SHIELD

## 2018-03-28 DIAGNOSIS — N926 Irregular menstruation, unspecified: Secondary | ICD-10-CM | POA: Diagnosis not present

## 2018-03-28 NOTE — Progress Notes (Signed)
[place] TA/TV: homogeneous anteverted uterus,wnl,EEC 8 mm,normal ovaries bilat,ovaries appear mobile,no pain during ultrasound chaperone:Peggy

## 2018-03-30 ENCOUNTER — Ambulatory Visit: Payer: BLUE CROSS/BLUE SHIELD | Admitting: Physical Therapy

## 2018-03-30 ENCOUNTER — Encounter: Payer: Self-pay | Admitting: Physical Therapy

## 2018-03-30 DIAGNOSIS — M542 Cervicalgia: Secondary | ICD-10-CM

## 2018-03-30 DIAGNOSIS — M6281 Muscle weakness (generalized): Secondary | ICD-10-CM

## 2018-03-30 DIAGNOSIS — R293 Abnormal posture: Secondary | ICD-10-CM

## 2018-03-30 NOTE — Therapy (Signed)
[facility] [address] [place], [place], [address] Phone: [phone]   Fax:  [phone]  Physical Therapy Treatment  Patient Details  Name: Catherine Haney MRN: [id number] Date of Birth: February 11, 1991 Referring Provider (PT): Arville Care, MD   Encounter Date: 03/30/2018  PT End of Session - 03/30/18 1038    Visit Number  5    Number of Visits  12    Date for PT Re-Evaluation  05/02/18    Authorization Type  Progress note every 10th visit    PT Start Time  1036    PT Stop Time  1120    PT Time Calculation (min)  44 min    Activity Tolerance  Patient limited by pain;Patient tolerated treatment well    Behavior During Therapy  [facility] for tasks assessed/performed       Past Medical History:  Diagnosis Date  . GERD (gastroesophageal reflux disease)     Past Surgical History:  Procedure Laterality Date  . DILATION AND CURETTAGE OF UTERUS N/A 08/25/2017   Procedure: SUCTION DILATATION AND CURETTAGE;  Surgeon: Lazaro Arms, MD;  Location: [facility];  Service: Gynecology;  Laterality: N/A;    There were no vitals filed for this visit.  Subjective Assessment - 03/30/18 1108    Subjective  Patient reported she had pain in her neck and right upper thoracic region yesterday but reports feels better today.    Patient is accompained by:  Interpreter   Bedor   Limitations  House hold activities;Lifting    Patient Stated Goals  decrease pain and move better    Currently in Pain?  Yes    Pain Score  5     Pain Location  Neck    Pain Orientation  Posterior    Pain Type  Chronic pain    Pain Onset  More than a month ago    Pain Frequency  Constant         [facility] PT Assessment - 03/30/18 0001      Assessment   Medical Diagnosis  Neck pain    Referring Provider (PT)  Arville Care, MD                   [facility] Adult PT Treatment/Exercise - 03/30/18 0001      Exercises   Exercises  Neck      Neck Exercises: Seated   Other Seated Exercise  scapular retractions x20 5" hold      Moist Heat Therapy   Number Minutes Moist Heat  15 Minutes    Moist Heat Location  Cervical      Electrical Stimulation   Electrical Stimulation Location  R UT    Electrical Stimulation Action  pre-mod    Electrical Stimulation Parameters  80-150 hz x15 min    Electrical Stimulation Goals  Pain      Manual Therapy   Manual Therapy  Soft tissue mobilization;Myofascial release;Joint mobilization    Soft tissue mobilization  to R UT/levator scap/scalenes/pectorals up to sternum in prone followed by STW/M to R medial border of the scapula and insertion point to levator scap      Neck Exercises: Stretches   Upper Trapezius Stretch  3 reps;30 seconds;Right;Left    Levator Stretch  3 reps;30 seconds;Right;Left                  PT Long Term Goals - 03/14/18 2132      PT LONG TERM GOAL #1   Title  Patient will be independent with HEP    Time  6    Period  Weeks    Status  New      PT LONG TERM GOAL #2   Title  Patient will report ability to peform ADLs and caregiving tasks with neck pain less than or equal to 3/10.    Time  6    Period  Weeks    Status  New      PT LONG TERM GOAL #3   Title  Patient will demonstrate 4/5 bilateral shoulder MMT to improve stability during functional tasks.     Time  6    Period  Weeks    Status  New            Plan - 03/30/18 1113    Clinical Impression Statement  Patient was able to tolerate treatment fairly well but noted with increased facial grimmaces with STW/M to pectoralis and sternum. Patient reported a decrease in pain after STW/M. Patient instructed to stretch to a comfortable position but not farther to cause pain. Patient reported understanding via interpreter. Normal response to modalities upon removal.     Clinical Presentation  Evolving    Clinical Decision Making  Low    Rehab Potential  Good    PT Frequency  2x / week    PT Duration  6 weeks    PT  Treatment/Interventions  ADLs/Self Care Home Management;Iontophoresis 4mg /ml Dexamethasone;Moist Heat;Ultrasound;Traction;Cryotherapy;Electrical Stimulation;Therapeutic exercise;Therapeutic activities;Neuromuscular re-education;Passive range of motion;Manual techniques;Dry needling;Patient/family education;Taping    PT Next Visit Plan  gentle AROM, postural exercises, STW/M, modalities PRN for pain relief    Consulted and Agree with Plan of Care  Patient       Patient will benefit from skilled therapeutic intervention in order to improve the following deficits and impairments:  Pain, Postural dysfunction, Decreased strength, Impaired UE functional use, Decreased activity tolerance, Decreased endurance  Visit Diagnosis: Cervicalgia  Abnormal posture  Muscle weakness (generalized)     Problem List Patient Active Problem List   Diagnosis Date Noted  . Atypical chest pain 10/17/2017  . Missed abortion 08/09/2017  . Yeast infection 01/19/2017  . Vitamin D deficiency 08/27/2015  . Hyperlipidemia 08/27/2015  . GERD (gastroesophageal reflux disease) 08/20/2015  . Generalized anxiety disorder 07/24/2015  . Other seasonal allergic rhinitis 03/18/2015  . Chronic pain of multiple joints 03/18/2015   Guss Bunde, PT, DPT 03/30/2018, 12:17 PM  [facility] [address] [place], [place], [address] Phone: [phone]   Fax:  [phone]  Name: Catherine Haney MRN: [id number] Date of Birth: April 19, 1991

## 2018-04-04 ENCOUNTER — Encounter: Payer: BLUE CROSS/BLUE SHIELD | Admitting: Physical Therapy

## 2018-04-06 ENCOUNTER — Encounter: Payer: BLUE CROSS/BLUE SHIELD | Admitting: Physical Therapy

## 2018-04-09 ENCOUNTER — Ambulatory Visit: Payer: BLUE CROSS/BLUE SHIELD | Admitting: Physical Therapy

## 2018-04-09 DIAGNOSIS — R293 Abnormal posture: Secondary | ICD-10-CM

## 2018-04-09 DIAGNOSIS — M542 Cervicalgia: Secondary | ICD-10-CM | POA: Diagnosis not present

## 2018-04-09 DIAGNOSIS — M6281 Muscle weakness (generalized): Secondary | ICD-10-CM

## 2018-04-09 NOTE — Therapy (Signed)
[facility] [address] [place], [place], [address] Phone: [phone]   Fax:  [phone]  Physical Therapy Treatment  Patient Details  Name: Catherine Haney MRN: [id number] Date of Birth: March 12, 1991 Referring Provider (PT): Arville Care, MD   Encounter Date: 04/09/2018  PT End of Session - 04/09/18 0820    Visit Number  6    Number of Visits  12    Date for PT Re-Evaluation  05/02/18    Authorization Type  Progress note every 10th visit    PT Start Time  0819    PT Stop Time  0903    PT Time Calculation (min)  44 min    Activity Tolerance  Patient limited by pain;Patient tolerated treatment well    Behavior During Therapy  [facility] for tasks assessed/performed       Past Medical History:  Diagnosis Date  . GERD (gastroesophageal reflux disease)     Past Surgical History:  Procedure Laterality Date  . DILATION AND CURETTAGE OF UTERUS N/A 08/25/2017   Procedure: SUCTION DILATATION AND CURETTAGE;  Surgeon: Lazaro Arms, MD;  Location: [facility];  Service: Gynecology;  Laterality: N/A;    There were no vitals filed for this visit.  Subjective Assessment - 04/09/18 0824    Subjective  Patient reports feeling good but is having more upper back/lower thoracic pain today.    Limitations  House hold activities;Lifting    Patient Stated Goals  decrease pain and move better    Currently in Pain?  Yes    Pain Score  7     Pain Location  Neck    Pain Orientation  Posterior;Lower    Pain Descriptors / Indicators  Discomfort    Pain Type  Chronic pain         [facility] PT Assessment - 04/09/18 0001      Assessment   Medical Diagnosis  Neck pain    Referring Provider (PT)  Arville Care, MD                   [facility] Adult PT Treatment/Exercise - 04/09/18 0001      Exercises   Exercises  Neck      Neck Exercises: Prone   Rows  10 reps      Modalities   Modalities  Electrical Stimulation;Moist Heat      Moist  Heat Therapy   Number Minutes Moist Heat  15 Minutes    Moist Heat Location  Cervical      Electrical Stimulation   Electrical Stimulation Location  R UT    Electrical Stimulation Action  pre-mod    Electrical Stimulation Parameters  80-150 hz x15 min    Electrical Stimulation Goals  Pain      Manual Therapy   Joint Mobilization  PA mobs to thoracic spine gdII/III    Soft tissue mobilization  to R UT/levator scap/scalenes/pectorals up to sternum in prone followed by STW/M to R medial border of the scapula and insertion point to levator scap      Neck Exercises: Stretches   Levator Stretch  3 reps;30 seconds;Right;Left    Neck Stretch  2 reps;30 seconds                  PT Long Term Goals - 04/09/18 0848      PT LONG TERM GOAL #1   Title  Patient will be independent with HEP    Period  Weeks    Status  On-going      PT LONG TERM GOAL #2   Title  Patient will report ability to peform ADLs and caregiving tasks with neck pain less than or equal to 3/10.    Time  6    Period  Weeks    Status  Unable to assess      PT LONG TERM GOAL #3   Title  Patient will demonstrate 4/5 bilateral shoulder MMT to improve stability during functional tasks.     Time  6    Period  Weeks    Status  On-going            Plan - 04/09/18 0849    Clinical Impression Statement  Patient was able to tolerate treatment fairly well. Patient provided with new strengthening exercises to which patient reported some pain but was able to complete repetitions. Patient noted with increased R UT tone to which decreased after STW/M. Patient's goals are ongoing at this time however LTG #2 was unable to be assessed secondary to not having a translator for this treatment. Normal response to modalities at end of session. Patient reported decrease in pain at end of session.     Clinical Presentation  Evolving    Clinical Decision Making  Low    Rehab Potential  Good    PT Frequency  2x / week    PT  Duration  6 weeks    PT Treatment/Interventions  ADLs/Self Care Home Management;Iontophoresis 4mg /ml Dexamethasone;Moist Heat;Ultrasound;Traction;Cryotherapy;Electrical Stimulation;Therapeutic exercise;Therapeutic activities;Neuromuscular re-education;Passive range of motion;Manual techniques;Dry needling;Patient/family education;Taping    PT Next Visit Plan  gentle AROM, postural exercises, STW/M, modalities PRN for pain relief    PT Home Exercise Plan  QL stretch and self MFR    Consulted and Agree with Plan of Care  Patient       Patient will benefit from skilled therapeutic intervention in order to improve the following deficits and impairments:  Pain, Postural dysfunction, Decreased strength, Impaired UE functional use, Decreased activity tolerance, Decreased endurance  Visit Diagnosis: Cervicalgia  Abnormal posture  Muscle weakness (generalized)     Problem List Patient Active Problem List   Diagnosis Date Noted  . Atypical chest pain 10/17/2017  . Missed abortion 08/09/2017  . Yeast infection 01/19/2017  . Vitamin D deficiency 08/27/2015  . Hyperlipidemia 08/27/2015  . GERD (gastroesophageal reflux disease) 08/20/2015  . Generalized anxiety disorder 07/24/2015  . Other seasonal allergic rhinitis 03/18/2015  . Chronic pain of multiple joints 03/18/2015    Guss BundeKrystle Jannely Henthorn, PT, DPT 04/09/2018, 9:12 AM  [facility] [address] [place], [place], [address] Phone: [phone]   Fax:  [phone]  Name: Catherine Haney MRN: [id number] Date of Birth: [birth year]

## 2018-04-11 ENCOUNTER — Ambulatory Visit (INDEPENDENT_AMBULATORY_CARE_PROVIDER_SITE_OTHER): Payer: BLUE CROSS/BLUE SHIELD | Admitting: Adult Health

## 2018-04-11 ENCOUNTER — Encounter: Payer: Self-pay | Admitting: Adult Health

## 2018-04-11 VITALS — BP 133/74 | HR 81 | Ht 62.0 in | Wt 128.0 lb

## 2018-04-11 DIAGNOSIS — Z319 Encounter for procreative management, unspecified: Secondary | ICD-10-CM

## 2018-04-11 NOTE — Progress Notes (Signed)
  Subjective:     Patient ID: Catherine Haney, female   DOB: 1990-07-23, 27 y.o.   MRN: [id number]  HPI Henrine ScrewsDunia is a 27 year old Arabic female, married, G3P2 in to discuss US and labs and wants to be pregnant.  Review of Systems Period on time this month Reviewed past medical,surgical, social and family history. Reviewed medications and allergies.     Objective:   Physical Exam BP 133/74 (BP Location: Left Arm, Patient Position: Sitting, Cuff Size: Normal)   Pulse 81   Ht 5\' 2"  (1.575 m)   Wt 128 lb (58.1 kg)   LMP 04/09/2018 (Exact Date)   BMI 23.41 kg/m   Talk  Only: has Arabic interrupter with her. US was normal. CBC was normal,FSH was 5.8, Prolactin was 11.9, LH was 12.8 and estradiol was 65.7 which were all normal. Discussed timing of sex and could take 6-18 months of active trying.    Assessment:     1. Patient desires pregnancy       Plan:     Have sex every other day, day 7-24 of cycle Void before sex and lay there 30 minutes after Continue folic acid Call with +HPT,she is aware that could take 6-18 months of active trying. F/U prn

## 2018-04-13 ENCOUNTER — Ambulatory Visit: Payer: BLUE CROSS/BLUE SHIELD | Admitting: Physical Therapy

## 2018-04-13 ENCOUNTER — Encounter: Payer: Self-pay | Admitting: Physical Therapy

## 2018-04-13 DIAGNOSIS — M542 Cervicalgia: Secondary | ICD-10-CM | POA: Diagnosis not present

## 2018-04-13 DIAGNOSIS — M6281 Muscle weakness (generalized): Secondary | ICD-10-CM

## 2018-04-13 DIAGNOSIS — R293 Abnormal posture: Secondary | ICD-10-CM

## 2018-04-13 NOTE — Therapy (Signed)
[facility] [address] [place], [place], [address] Phone: [phone]   Fax:  [phone]  Physical Therapy Treatment  Patient Details  Name: Catherine Haney MRN: [id number] Date of Birth: [birth year] Referring Provider (PT): Arville Care, MD   Encounter Date: 04/13/2018  PT End of Session - 04/13/18 0829    Visit Number  7    Number of Visits  12    Date for PT Re-Evaluation  05/02/18    Authorization Type  Progress note every 10th visit    PT Start Time  0815    PT Stop Time  0906    PT Time Calculation (min)  51 min    Activity Tolerance  Patient limited by pain;Patient tolerated treatment well    Behavior During Therapy  [facility] for tasks assessed/performed       Past Medical History:  Diagnosis Date  . GERD (gastroesophageal reflux disease)     Past Surgical History:  Procedure Laterality Date  . DILATION AND CURETTAGE OF UTERUS N/A 08/25/2017   Procedure: SUCTION DILATATION AND CURETTAGE;  Surgeon: Lazaro Arms, MD;  Location: [facility];  Service: Gynecology;  Laterality: N/A;    There were no vitals filed for this visit.  Subjective Assessment - 04/13/18 6962    Subjective  Patient reports feeling much better; both her neck and upper back are better.    Patient is accompained by:  Interpreter   Bedor   Limitations  House hold activities;Lifting    Patient Stated Goals  decrease pain and move better    Currently in Pain?  Yes    Pain Score  --   did not provide pain scale        [facility] PT Assessment - 04/13/18 0001      Assessment   Medical Diagnosis  Neck pain    Referring Provider (PT)  Arville Care, MD                   [facility] Adult PT Treatment/Exercise - 04/13/18 0001      Exercises   Exercises  Neck      Neck Exercises: Theraband   Scapula Retraction  10 reps    Scapula Retraction Limitations  yellow,  supine    Rows  20 reps   red   Horizontal ABduction  20 reps   yellow     Neck Exercises: Supine   Shoulder Flexion  Both;20 reps      Moist Heat Therapy   Number Minutes Moist Heat  15 Minutes    Moist Heat Location  Cervical      Electrical Stimulation   Electrical Stimulation Location  L UT    Electrical Stimulation Action  premod    Electrical Stimulation Parameters  80-150 hz x15 min    Electrical Stimulation Goals  Pain      Manual Therapy   Joint Mobilization  PA mobs to thoracic spine gdII/III    Soft tissue mobilization  to L UT/levator scap in prone Trigger point release to TP in insertion point of left levator scapulae                  PT Long Term Goals - 04/09/18 0848      PT LONG TERM GOAL #1   Title  Patient will be independent with HEP    Period  Weeks    Status  On-going      PT LONG TERM GOAL #2   Title  Patient will report ability to peform ADLs and caregiving tasks with neck pain less than or equal to 3/10.    Time  6    Period  Weeks    Status  Unable to assess      PT LONG TERM GOAL #3   Title  Patient will demonstrate 4/5 bilateral shoulder MMT to improve stability during functional tasks.     Time  6    Period  Weeks    Status  On-going            Plan - 04/13/18 0934    Clinical Impression Statement  Patient was able to tolerate progression of treatment well. Patient noted with facial grimmaces with strengthening exercises but patient stated she felt some pain but was tolerable. Patient noted with increased L UT tone in comparison to right which reduced after STW/M and trigger point release. Normal response to modalities at end of session.     Clinical Presentation  Evolving    Clinical Decision Making  Low    Rehab Potential  Good    PT Frequency  2x / week    PT Duration  6 weeks    PT Treatment/Interventions  ADLs/Self Care Home Management;Iontophoresis 4mg /ml Dexamethasone;Moist Heat;Ultrasound;Traction;Cryotherapy;Electrical Stimulation;Therapeutic exercise;Therapeutic activities;Neuromuscular  re-education;Passive range of motion;Manual techniques;Dry needling;Patient/family education;Taping    PT Next Visit Plan  gentle AROM, postural exercises, STW/M, modalities PRN for pain relief    Consulted and Agree with Plan of Care  Patient       Patient will benefit from skilled therapeutic intervention in order to improve the following deficits and impairments:  Pain, Postural dysfunction, Decreased strength, Impaired UE functional use, Decreased activity tolerance, Decreased endurance  Visit Diagnosis: Cervicalgia  Abnormal posture  Muscle weakness (generalized)     Problem List Patient Active Problem List   Diagnosis Date Noted  . Atypical chest pain 10/17/2017  . Missed abortion 08/09/2017  . Yeast infection 01/19/2017  . Vitamin D deficiency 08/27/2015  . Hyperlipidemia 08/27/2015  . GERD (gastroesophageal reflux disease) 08/20/2015  . Generalized anxiety disorder 07/24/2015  . Other seasonal allergic rhinitis 03/18/2015  . Chronic pain of multiple joints 03/18/2015   Guss BundeKrystle Lorin Gawron, PT, DPT 04/13/2018, 5:00 PM  [facility] [address] [place], [place], [address] Phone: [phone]   Fax:  [phone]  Name: Catherine Haney MRN: [id number] Date of Birth: 02-Sep-1990

## 2018-04-17 ENCOUNTER — Encounter: Payer: BLUE CROSS/BLUE SHIELD | Admitting: Physical Therapy

## 2018-04-23 ENCOUNTER — Ambulatory Visit: Payer: BLUE CROSS/BLUE SHIELD | Attending: Family Medicine | Admitting: Physical Therapy

## 2018-04-23 ENCOUNTER — Encounter: Payer: Self-pay | Admitting: Physical Therapy

## 2018-04-23 DIAGNOSIS — R293 Abnormal posture: Secondary | ICD-10-CM

## 2018-04-23 DIAGNOSIS — M6281 Muscle weakness (generalized): Secondary | ICD-10-CM | POA: Insufficient documentation

## 2018-04-23 DIAGNOSIS — M542 Cervicalgia: Secondary | ICD-10-CM

## 2018-04-23 NOTE — Therapy (Signed)
[facility] [address] [place], [place], [address] Phone: [phone]   Fax:  [phone]  Physical Therapy Treatment  Patient Details  Name: Catherine Haney MRN: [id number] Date of Birth: [birth year] Referring Provider (PT): Arville CareJoshua Dettinger, MD   Encounter Date: 04/23/2018  PT End of Session - 04/23/18 0909    Visit Number  8    Number of Visits  12    Date for PT Re-Evaluation  05/02/18    Authorization Type  Progress note every 10th visit    PT Start Time  0815    PT Stop Time  0907    PT Time Calculation (min)  52 min    Activity Tolerance  Patient tolerated treatment well    Behavior During Therapy  [facility] for tasks assessed/performed       Past Medical History:  Diagnosis Date  . GERD (gastroesophageal reflux disease)     Past Surgical History:  Procedure Laterality Date  . DILATION AND CURETTAGE OF UTERUS N/A 08/25/2017   Procedure: SUCTION DILATATION AND CURETTAGE;  Surgeon: Lazaro ArmsEure, Luther H, MD;  Location: [facility];  Service: Gynecology;  Laterality: N/A;    There were no vitals filed for this visit.  Subjective Assessment - 04/23/18 0855    Subjective  Patient reported overall improvement yet ongoing discomfort    Patient is accompained by:  Interpreter    Limitations  House hold activities;Lifting    Patient Stated Goals  decrease pain and move better    Currently in Pain?  Yes    Pain Score  4     Pain Location  Neck    Pain Orientation  Left;Right;Lower    Pain Descriptors / Indicators  Discomfort;Tightness    Pain Type  Chronic pain    Pain Radiating Towards  into upper postural mucscles    Pain Onset  More than a month ago    Pain Frequency  Constant    Aggravating Factors   increased activity or movement    Pain Relieving Factors  rest and therapy                       [facility] Adult PT Treatment/Exercise - 04/23/18 0001      Modalities   Modalities  Electrical Stimulation;Moist  Heat;Ultrasound      Moist Heat Therapy   Number Minutes Moist Heat  15 Minutes    Moist Heat Location  Cervical      Electrical Stimulation   Electrical Stimulation Location  L UT    Electrical Stimulation Action  IFC    Electrical Stimulation Parameters  80-150hz  x15    Electrical Stimulation Goals  Pain      Ultrasound   Ultrasound Location  bil cervical UT paraspinals    Ultrasound Parameters  1.2w/cm2/50%/41mhz x1812min    Ultrasound Goals  Pain      Manual Therapy   Manual Therapy  Myofascial release;Soft tissue mobilization    Manual therapy comments  Manual STW/MFR to bil cervical paraspinals and scapular muscles alond scap boardser to reduce tightness and pain    Soft tissue mobilization  to BIL UT/levator scap Tpr                  PT Long Term Goals - 04/23/18 0910      PT LONG TERM GOAL #1   Title  Patient will be independent with HEP    Time  6    Period  Weeks  Status  On-going      PT LONG TERM GOAL #2   Title  Patient will report ability to peform ADLs and caregiving tasks with neck pain less than or equal to 3/10.    Time  6    Period  Weeks    Status  On-going   pain reported 4/10 with ADL's at this time 04/23/18     PT LONG TERM GOAL #3   Title  Patient will demonstrate 4/5 bilateral shoulder MMT to improve stability during functional tasks.     Time  6    Period  Weeks    Status  On-going            Plan - 04/23/18 0910    Clinical Impression Statement  Patient tolerated treatment well today. Patient has reported some improvement each visit yet has ongoing dicomfort around a 4/10. Patient has tightness in bil cervical/UT and down into bil scapula area. Patient has more discomfort on right todat than left. Patient progresing toward goals yet ongoing due to discomfort. Patient responded well to treatment today.     Rehab Potential  Good    PT Frequency  2x / week    PT Duration  6 weeks    PT Treatment/Interventions  ADLs/Self Care  Home Management;Iontophoresis 4mg /ml Dexamethasone;Moist Heat;Ultrasound;Traction;Cryotherapy;Electrical Stimulation;Therapeutic exercise;Therapeutic activities;Neuromuscular re-education;Passive range of motion;Manual techniques;Dry needling;Patient/family education;Taping    PT Next Visit Plan  cont with POC for gentle AROM, postural exercises, STW/M, modalities PRN for pain relief    Consulted and Agree with Plan of Care  Patient       Patient will benefit from skilled therapeutic intervention in order to improve the following deficits and impairments:  Pain, Postural dysfunction, Decreased strength, Impaired UE functional use, Decreased activity tolerance, Decreased endurance  Visit Diagnosis: Cervicalgia  Abnormal posture  Muscle weakness (generalized)     Problem List Patient Active Problem List   Diagnosis Date Noted  . Atypical chest pain 10/17/2017  . Missed abortion 08/09/2017  . Yeast infection 01/19/2017  . Vitamin D deficiency 08/27/2015  . Hyperlipidemia 08/27/2015  . GERD (gastroesophageal reflux disease) 08/20/2015  . Generalized anxiety disorder 07/24/2015  . Other seasonal allergic rhinitis 03/18/2015  . Chronic pain of multiple joints 03/18/2015    ,  P, PTA 04/23/2018, 9:13 AM  [facility] [address] [place], [place], [address] Phone: [phone]   Fax:  [phone]  Name: Catherine Haney MRN: [id number] Date of Birth: 1991/03/08

## 2018-05-01 ENCOUNTER — Ambulatory Visit: Payer: BLUE CROSS/BLUE SHIELD | Admitting: Physical Therapy

## 2018-05-01 ENCOUNTER — Encounter: Payer: Self-pay | Admitting: Physical Therapy

## 2018-05-01 DIAGNOSIS — M542 Cervicalgia: Secondary | ICD-10-CM

## 2018-05-01 DIAGNOSIS — R293 Abnormal posture: Secondary | ICD-10-CM

## 2018-05-01 DIAGNOSIS — M6281 Muscle weakness (generalized): Secondary | ICD-10-CM

## 2018-05-01 NOTE — Therapy (Signed)
[facility] [address] [place], [place], [address] Phone: [phone]   Fax:  [phone]  Physical Therapy Treatment  Patient Details  Name: Catherine Haney MRN: [id number] Date of Birth: [birth year] Referring Provider (PT): Arville Care, MD   Encounter Date: 05/01/2018  PT End of Session - 05/01/18 1203    Visit Number  9    Number of Visits  12    Date for PT Re-Evaluation  05/02/18    Authorization Type  Progress note every 10th visit    PT Start Time  1118    PT Stop Time  1200    PT Time Calculation (min)  42 min    Activity Tolerance  Patient tolerated treatment well    Behavior During Therapy  [facility] for tasks assessed/performed       Past Medical History:  Diagnosis Date  . GERD (gastroesophageal reflux disease)     Past Surgical History:  Procedure Laterality Date  . DILATION AND CURETTAGE OF UTERUS N/A 08/25/2017   Procedure: SUCTION DILATATION AND CURETTAGE;  Surgeon: Lazaro Arms, MD;  Location: [facility];  Service: Gynecology;  Laterality: N/A;    There were no vitals filed for this visit.  Subjective Assessment - 05/01/18 1159    Subjective  Reports that she is better but havng some burning. Reported chest pain with looking at her phone. Reports being more comfortable.    Patient is accompained by:  Interpreter    Limitations  House hold activities;Lifting    Patient Stated Goals  decrease pain and move better    Currently in Pain?  Yes    Pain Score  7     Pain Location  Chest    Pain Descriptors / Indicators  Burning    Pain Type  Chronic pain    Pain Onset  More than a month ago         [facility] PT Assessment - 05/01/18 0001      Assessment   Medical Diagnosis  Neck pain    Referring Provider (PT)  Arville Care, MD    Hand Dominance  Right    Prior Therapy  no      Precautions   Precautions  None      Restrictions   Weight Bearing Restrictions  No                   [facility]  Adult PT Treatment/Exercise - 05/01/18 0001      Modalities   Modalities  Electrical Stimulation;Moist Heat;Ultrasound      Moist Heat Therapy   Number Minutes Moist Heat  15 Minutes    Moist Heat Location  Cervical      Electrical Stimulation   Electrical Stimulation Location  B UT/mid trap    Electrical Stimulation Action  IFC    Electrical Stimulation Parameters  80-150 hz x15 min    Electrical Stimulation Goals  Pain;Tone      Ultrasound   Ultrasound Location  B UT    Ultrasound Parameters  1.5 w/cm2, 100%, 1 mhz x10 min    Ultrasound Goals  Pain      Manual Therapy   Manual Therapy  Myofascial release;Soft tissue mobilization    Soft tissue mobilization  STW to B UT, mid trap to reduce pain and muscle tightness                  PT Long Term Goals - 04/23/18 3244  PT LONG TERM GOAL #1   Title  Patient will be independent with HEP    Time  6    Period  Weeks    Status  On-going      PT LONG TERM GOAL #2   Title  Patient will report ability to peform ADLs and caregiving tasks with neck pain less than or equal to 3/10.    Time  6    Period  Weeks    Status  On-going   pain reported 4/10 with ADL's at this time 04/23/18     PT LONG TERM GOAL #3   Title  Patient will demonstrate 4/5 bilateral shoulder MMT to improve stability during functional tasks.     Time  6    Period  Weeks    Status  On-going            Plan - 05/01/18 1204    Clinical Impression Statement  Patient presented in clinic with reports of feeling better and more comfortable but reported chest pain as well. Increased muscle tightness palpable in B UT with L > R. No complaints were reported by patient/interpreter of any tenderness or pain with manual therapy. Normal modalities response noted following removal of the modalities.    Rehab Potential  Good    PT Frequency  2x / week    PT Duration  6 weeks    PT Treatment/Interventions  ADLs/Self Care Home Management;Iontophoresis  4mg /ml Dexamethasone;Moist Heat;Ultrasound;Traction;Cryotherapy;Electrical Stimulation;Therapeutic exercise;Therapeutic activities;Neuromuscular re-education;Passive range of motion;Manual techniques;Dry needling;Patient/family education;Taping    PT Next Visit Plan  cont with POC for gentle AROM, postural exercises, STW/M, modalities PRN for pain relief    PT Home Exercise Plan  QL stretch and self MFR    Consulted and Agree with Plan of Care  Patient       Patient will benefit from skilled therapeutic intervention in order to improve the following deficits and impairments:  Pain, Postural dysfunction, Decreased strength, Impaired UE functional use, Decreased activity tolerance, Decreased endurance  Visit Diagnosis: Cervicalgia  Abnormal posture  Muscle weakness (generalized)     Problem List Patient Active Problem List   Diagnosis Date Noted  . Atypical chest pain 10/17/2017  . Missed abortion 08/09/2017  . Yeast infection 01/19/2017  . Vitamin D deficiency 08/27/2015  . Hyperlipidemia 08/27/2015  . GERD (gastroesophageal reflux disease) 08/20/2015  . Generalized anxiety disorder 07/24/2015  . Other seasonal allergic rhinitis 03/18/2015  . Chronic pain of multiple joints 03/18/2015    Marvell FullerKelsey P Kennon, PTA 05/01/2018, 12:12 PM  [facility] [address] [place], [place], [address] Phone: [phone]   Fax:  [phone]  Name: Reginia FortsDunia Fluitt MRN: [id number] Date of Birth: 10/16/1990

## 2018-05-04 ENCOUNTER — Ambulatory Visit: Payer: BLUE CROSS/BLUE SHIELD | Admitting: Physical Therapy

## 2018-05-04 ENCOUNTER — Encounter: Payer: BLUE CROSS/BLUE SHIELD | Admitting: Physical Therapy

## 2018-05-04 ENCOUNTER — Encounter: Payer: Self-pay | Admitting: Physical Therapy

## 2018-05-04 DIAGNOSIS — M542 Cervicalgia: Secondary | ICD-10-CM

## 2018-05-04 DIAGNOSIS — M6281 Muscle weakness (generalized): Secondary | ICD-10-CM

## 2018-05-04 DIAGNOSIS — R293 Abnormal posture: Secondary | ICD-10-CM

## 2018-05-04 NOTE — Therapy (Signed)
[facility] [address] [place], [place], [address] Phone: [phone]   Fax:  [phone]  Physical Therapy Treatment  Patient Details  Name: Catherine Haney MRN: [id number] Date of Birth: 1990-07-29 Referring Provider (PT): Arville Care, MD   Encounter Date: 05/04/2018  PT End of Session - 05/04/18 1113    Visit Number  10    Number of Visits  12    Date for PT Re-Evaluation  05/02/18    Authorization Type  Progress note every 10th visit    PT Start Time  1116    PT Stop Time  1158    PT Time Calculation (min)  42 min    Activity Tolerance  Patient tolerated treatment well    Behavior During Therapy  [facility] for tasks assessed/performed       Past Medical History:  Diagnosis Date  . GERD (gastroesophageal reflux disease)     Past Surgical History:  Procedure Laterality Date  . DILATION AND CURETTAGE OF UTERUS N/A 08/25/2017   Procedure: SUCTION DILATATION AND CURETTAGE;  Surgeon: Lazaro Arms, MD;  Location: [facility];  Service: Gynecology;  Laterality: N/A;    There were no vitals filed for this visit.  Subjective Assessment - 05/04/18 1113    Subjective  Reports that her neck is better wiht only a little pain.    Patient is accompained by:  Interpreter    Limitations  House hold activities;Lifting    Patient Stated Goals  decrease pain and move better    Currently in Pain?  Yes    Pain Score  4     Pain Location  Neck    Pain Orientation  Left;Right    Pain Descriptors / Indicators  Tightness;Sharp    Pain Type  Chronic pain    Pain Onset  More than a month ago         [facility] PT Assessment - 05/04/18 0001      Assessment   Medical Diagnosis  Neck pain    Referring Provider (PT)  Arville Care, MD    Hand Dominance  Right    Next MD Visit  None    Prior Therapy  no      Precautions   Precautions  None      Restrictions   Weight Bearing Restrictions  No                   [facility] Adult PT  Treatment/Exercise - 05/04/18 0001      Modalities   Modalities  Electrical Stimulation;Moist Heat;Ultrasound      Moist Heat Therapy   Number Minutes Moist Heat  15 Minutes    Moist Heat Location  Cervical      Electrical Stimulation   Electrical Stimulation Location  B UT/mid trap    Electrical Stimulation Action  IFC    Electrical Stimulation Parameters  80-150 hz x15 min    Electrical Stimulation Goals  Pain;Tone      Ultrasound   Ultrasound Location  B UT     Ultrasound Parameters  1.5 w/cm2, 100%, 1 mhz x10 min    Ultrasound Goals  Pain      Manual Therapy   Manual Therapy  Soft tissue mobilization    Soft tissue mobilization  STW to B UT, mid trap to reduce pain and muscle tightness                  PT Long Term Goals - 04/23/18 1324  PT LONG TERM GOAL #1   Title  Patient will be independent with HEP    Time  6    Period  Weeks    Status  On-going      PT LONG TERM GOAL #2   Title  Patient will report ability to peform ADLs and caregiving tasks with neck pain less than or equal to 3/10.    Time  6    Period  Weeks    Status  On-going   pain reported 4/10 with ADL's at this time 04/23/18     PT LONG TERM GOAL #3   Title  Patient will demonstrate 4/5 bilateral shoulder MMT to improve stability during functional tasks.     Time  6    Period  Weeks    Status  On-going            Plan - 05/04/18 1152    Clinical Impression Statement  Patient presented in clinic with reports of overall improvement with cervical symptoms. Continued tightness palpable in superior fibers of the B UT but no complaints of pain or tenderness during manual therapy reported. Normal modalities response noted following removal of the modalities. No complaints following end of treatment.    Rehab Potential  Good    PT Frequency  2x / week    PT Duration  6 weeks    PT Treatment/Interventions  ADLs/Self Care Home Management;Iontophoresis 4mg /ml Dexamethasone;Moist  Heat;Ultrasound;Traction;Cryotherapy;Electrical Stimulation;Therapeutic exercise;Therapeutic activities;Neuromuscular re-education;Passive range of motion;Manual techniques;Dry needling;Patient/family education;Taping    PT Next Visit Plan  cont with POC for gentle AROM, postural exercises, STW/M, modalities PRN for pain relief    PT Home Exercise Plan  QL stretch and self MFR    Consulted and Agree with Plan of Care  Patient       Patient will benefit from skilled therapeutic intervention in order to improve the following deficits and impairments:  Pain, Postural dysfunction, Decreased strength, Impaired UE functional use, Decreased activity tolerance, Decreased endurance  Visit Diagnosis: Cervicalgia  Abnormal posture  Muscle weakness (generalized)     Problem List Patient Active Problem List   Diagnosis Date Noted  . Atypical chest pain 10/17/2017  . Missed abortion 08/09/2017  . Yeast infection 01/19/2017  . Vitamin D deficiency 08/27/2015  . Hyperlipidemia 08/27/2015  . GERD (gastroesophageal reflux disease) 08/20/2015  . Generalized anxiety disorder 07/24/2015  . Other seasonal allergic rhinitis 03/18/2015  . Chronic pain of multiple joints 03/18/2015    Marvell FullerKelsey P Caylon Saine, PTA 05/04/2018, 12:01 PM  [facility] [address] [place], [place], [address] Phone: [phone]   Fax:  [phone]  Name: Catherine Haney MRN: [id number] Date of Birth: 03-Oct-1990

## 2018-05-10 ENCOUNTER — Ambulatory Visit: Payer: BLUE CROSS/BLUE SHIELD | Admitting: Physical Therapy

## 2018-05-11 ENCOUNTER — Encounter: Payer: Self-pay | Admitting: Family Medicine

## 2018-05-11 ENCOUNTER — Encounter: Payer: Self-pay | Admitting: Cardiovascular Disease

## 2018-05-15 ENCOUNTER — Encounter: Payer: Self-pay | Admitting: Physical Therapy

## 2018-05-15 ENCOUNTER — Ambulatory Visit: Payer: BLUE CROSS/BLUE SHIELD | Admitting: Physical Therapy

## 2018-05-15 DIAGNOSIS — R293 Abnormal posture: Secondary | ICD-10-CM

## 2018-05-15 DIAGNOSIS — M542 Cervicalgia: Secondary | ICD-10-CM

## 2018-05-15 DIAGNOSIS — M6281 Muscle weakness (generalized): Secondary | ICD-10-CM

## 2018-05-15 NOTE — Therapy (Addendum)
[facility] [place], [place], [address] Phone: [phone]   Fax:  [phone]  Physical Therapy Treatment/Discharge  PHYSICAL THERAPY DISCHARGE SUMMARY  Visits from Start of Care: 11  Current functional level related to goals / functional outcomes: See below   Remaining deficits: See goals   Education / Equipment: HEP  Plan: Patient agrees to discharge.  Patient goals were partially met. Patient is being discharged due to lack of progress.  ?????    Gabriela Eves, PT, DPT 08/22/18  Patient Details  Name: Catherine Haney MRN: [id number] Date of Birth: 20-Sep-1990 Referring Provider (PT): Caryl Pina, MD   Encounter Date: 05/15/2018  PT End of Session - 05/15/18 1033    Visit Number  11    Number of Visits  12    Date for PT Re-Evaluation  05/02/18    Authorization Type  Progress note every 10th visit    PT Start Time  1033    PT Stop Time  1118    PT Time Calculation (min)  45 min    Activity Tolerance  Patient tolerated treatment well    Behavior During Therapy  [facility] for tasks assessed/performed       Past Medical History:  Diagnosis Date  . GERD (gastroesophageal reflux disease)     Past Surgical History:  Procedure Laterality Date  . DILATION AND CURETTAGE OF UTERUS N/A 08/25/2017   Procedure: SUCTION DILATATION AND CURETTAGE;  Surgeon: Florian Buff, MD;  Location: [facility];  Service: Gynecology;  Laterality: N/A;    There were no vitals filed for this visit.  Subjective Assessment - 05/15/18 1032    Subjective  Reports that she thinks she could benefit from more visits due to pain.    Limitations  House hold activities;Lifting    Patient Stated Goals  decrease pain and move better    Currently in Pain?  Yes    Pain Score  5     Pain Location  Neck    Pain Orientation  Right;Left    Pain Descriptors / Indicators  Discomfort    Pain Type  Chronic pain    Pain Onset  More than a month ago          [facility] PT Assessment - 05/15/18 0001      Assessment   Medical Diagnosis  Neck pain    Referring Provider (PT)  Caryl Pina, MD    Hand Dominance  Right    Next MD Visit  None    Prior Therapy  no      Precautions   Precautions  None      Restrictions   Weight Bearing Restrictions  No                   [facility] Adult PT Treatment/Exercise - 05/15/18 0001      Modalities   Modalities  Electrical Stimulation;Moist Heat;Ultrasound      Moist Heat Therapy   Number Minutes Moist Heat  15 Minutes    Moist Heat Location  Cervical      Electrical Stimulation   Electrical Stimulation Location  B UT    Electrical Stimulation Action  Pre-Mod    Electrical Stimulation Parameters  80-150 hz x15 min    Electrical Stimulation Goals  Pain;Tone      Ultrasound   Ultrasound Location  B UT    Ultrasound Parameters  1.5 w/cm2, 100%, 1 mhz x10 min    Ultrasound Goals  Pain  Manual Therapy   Manual Therapy  Soft tissue mobilization    Soft tissue mobilization  STW to B UT, mid trap to reduce pain and muscle tightness                  PT Long Term Goals - 05/15/18 1103      PT LONG TERM GOAL #1   Title  Patient will be independent with HEP    Time  6    Period  Weeks    Status  Achieved      PT LONG TERM GOAL #2   Title  Patient will report ability to peform ADLs and caregiving tasks with neck pain less than or equal to 3/10.    Time  6    Period  Weeks    Status  On-going   8/10 reported at times 05/15/2018     PT LONG TERM GOAL #3   Title  Patient will demonstrate 4/5 bilateral shoulder MMT to improve stability during functional tasks.     Time  6    Period  Weeks    Status  On-going            Plan - 05/15/18 1109    Clinical Impression Statement  Patient presented in clinic with continued reports of cervical pain and muscle tightness. Patient indicated that with ADLs and caregiving that at times pain can increase to 8/10.  Patient also looking down at her phone a lot throughout the day per patient report. Increased UT tightness palpable especially in R UT today. Normal modalities response noted following removal of the modalities.    Rehab Potential  Good    PT Frequency  2x / week    PT Duration  6 weeks    PT Treatment/Interventions  ADLs/Self Care Home Management;Iontophoresis 65m/ml Dexamethasone;Moist Heat;Ultrasound;Traction;Cryotherapy;Electrical Stimulation;Therapeutic exercise;Therapeutic activities;Neuromuscular re-education;Passive range of motion;Manual techniques;Dry needling;Patient/family education;Taping    PT Next Visit Plan  cont with POC for gentle AROM, postural exercises, STW/M, modalities PRN for pain relief    PT Home Exercise Plan  QL stretch and self MFR    Consulted and Agree with Plan of Care  Patient       Patient will benefit from skilled therapeutic intervention in order to improve the following deficits and impairments:  Pain, Postural dysfunction, Decreased strength, Impaired UE functional use, Decreased activity tolerance, Decreased endurance  Visit Diagnosis: Cervicalgia  Abnormal posture  Muscle weakness (generalized)     Problem List Patient Active Problem List   Diagnosis Date Noted  . Atypical chest pain 10/17/2017  . Missed abortion 08/09/2017  . Yeast infection 01/19/2017  . Vitamin D deficiency 08/27/2015  . Hyperlipidemia 08/27/2015  . GERD (gastroesophageal reflux disease) 08/20/2015  . Generalized anxiety disorder 07/24/2015  . Other seasonal allergic rhinitis 03/18/2015  . Chronic pain of multiple joints 03/18/2015    KStandley Brooking PTA 05/15/2018, 11:20 AM  [facility] [address]Phone: [phone]  Fax:  [phone] Name: Catherine ElfordMRN: [id number] of Birth: [birth year]

## 2018-05-23 NOTE — L&D Delivery Note (Signed)
OB/GYN Faculty Practice Delivery Note  Catherine Haney is a 28 y.o. now [id number] s/p SVD at [redacted]w[redacted]d who was admitted for SROM @ 1230.   ROM: 9h 66m with clear fluid GBS Status: Negative Maximum Maternal Temperature: 98.5  Delivery Note At 9:18 PM a viable female was delivered via Vaginal, Spontaneous (Presentation: LOA).  APGAR: 9, 9; weight pending.   Placenta status: spontaneous via Delena Bali, intact.  Cord: 3VC with no complications.  Cord pH: n/a  Anesthesia: Epidural Episiotomy: None Lacerations: small perineal, hemostatic Suture Repair: none QBL (mL): 12     Postpartum Planning [x]  Mom to postpartum.  Baby to Couplet care / Skin to Skin..  [x]  plans to breast feed [x]  message to sent to schedule follow-up  [x]  vaccines -- declined all  Laury Deep, MSN, CNM 01/13/19, 9:36 PM

## 2018-05-26 ENCOUNTER — Ambulatory Visit: Payer: Self-pay | Admitting: Cardiovascular Disease

## 2018-05-26 ENCOUNTER — Ambulatory Visit: Payer: Self-pay | Admitting: Family Medicine

## 2018-05-28 ENCOUNTER — Ambulatory Visit: Payer: BLUE CROSS/BLUE SHIELD | Admitting: Family

## 2018-05-28 ENCOUNTER — Ambulatory Visit (INDEPENDENT_AMBULATORY_CARE_PROVIDER_SITE_OTHER): Payer: PRIVATE HEALTH INSURANCE | Admitting: Family Medicine

## 2018-05-28 ENCOUNTER — Encounter: Payer: Self-pay | Admitting: Family Medicine

## 2018-05-28 VITALS — BP 110/62 | HR 89 | Temp 98.6°F | Ht 62.0 in | Wt 124.0 lb

## 2018-05-28 DIAGNOSIS — J069 Acute upper respiratory infection, unspecified: Secondary | ICD-10-CM

## 2018-05-28 DIAGNOSIS — Z3201 Encounter for pregnancy test, result positive: Secondary | ICD-10-CM | POA: Diagnosis not present

## 2018-05-28 LAB — PREGNANCY, URINE: Preg Test, Ur: POSITIVE — AB

## 2018-05-28 MED ORDER — PRENATAL 1 30-0.975-200 MG PO CAPS: 1.0000 | ORAL_CAPSULE | Freq: Every day | ORAL | 0 refills | Status: DC

## 2018-05-28 NOTE — Patient Instructions (Signed)
Safe Medications in Pregnancy   Acne: Benzoyl Peroxide Salicylic Acid  Backache/Headache: Tylenol: 2 regular strength every 4 hours OR              2 Extra strength every 6 hours  Colds/Coughs/Allergies: Benadryl (alcohol free) 25 mg every 6 hours as needed Breath right strips Claritin Cepacol throat lozenges Chloraseptic throat spray Cold-Eeze- up to three times per day Cough drops, alcohol free Flonase (by prescription only) Guaifenesin Mucinex Robitussin DM (plain only, alcohol free) Saline nasal spray/drops Sudafed (pseudoephedrine) & Actifed ** use only after [redacted] weeks gestation and if you do not have high blood pressure Tylenol Vicks Vaporub Zinc lozenges Zyrtec   Constipation: Colace Ducolax suppositories Fleet enema Glycerin suppositories Metamucil Milk of magnesia Miralax Senokot Smooth move tea  Diarrhea: Kaopectate Imodium A-D  *NO pepto Bismol  Hemorrhoids: Anusol Anusol HC Preparation H Tucks  Indigestion: Tums Maalox Mylanta Zantac  Pepcid  Insomnia: Benadryl (alcohol free) 25mg  every 6 hours as needed Tylenol PM Unisom, no Gelcaps  Leg Cramps: Tums MagGel  Nausea/Vomiting:  Bonine Dramamine Emetrol Ginger extract Sea bands Meclizine  Nausea medication to take during pregnancy:  Unisom (doxylamine succinate 25 mg tablets) Take one tablet daily at bedtime. If symptoms are not adequately controlled, the dose can be increased to a maximum recommended dose of two tablets daily (1/2 tablet in the morning, 1/2 tablet mid-afternoon and one at bedtime). Vitamin B6 100mg  tablets. Take one tablet twice a day (up to 200 mg per day).  Skin Rashes: Aveeno products Benadryl cream or 25mg  every 6 hours as needed Calamine Lotion 1% cortisone cream  Yeast infection: Gyne-lotrimin 7 Monistat 7   **If taking multiple medications, please check labels to avoid duplicating the same active ingredients **take medication as directed on  the label ** Do not exceed 4000 mg of tylenol in 24 hours **Do not take medications that contain aspirin or ibuprofen      Upper Respiratory Infection, Adult An upper respiratory infection (URI) affects the nose, throat, and upper air passages. URIs are caused by germs (viruses). The most common type of URI is often called "the common cold." Medicines cannot cure URIs, but you can do things at home to relieve your symptoms. URIs usually get better within 7-10 days. Follow these instructions at home: Activity  Rest as needed.  If you have a fever, stay home from work or school until your fever is gone, or until your doctor says you may return to work or school. ? You should stay home until you cannot spread the infection anymore (you are not contagious). ? Your doctor may have you wear a face mask so you have less risk of spreading the infection. Relieving symptoms  Gargle with a salt-water mixture 3-4 times a day or as needed. To make a salt-water mixture, completely dissolve -1 tsp of salt in 1 cup of warm water.  Use a cool-mist humidifier to add moisture to the air. This can help you breathe more easily. Eating and drinking   Drink enough fluid to keep your pee (urine) pale yellow.  Eat soups and other clear broths. General instructions   Take over-the-counter and prescription medicines only as told by your doctor. These include cold medicines, fever reducers, and cough suppressants.  Do not use any products that contain nicotine or tobacco. These include cigarettes and e-cigarettes. If you need help quitting, ask your doctor.  Avoid being where people are smoking (avoid secondhand smoke).  Make sure you get regular  shots and get the flu shot every year.  Keep all follow-up visits as told by your doctor. This is important. How to avoid spreading infection to others   Wash your hands often with soap and water. If you do not have soap and water, use hand  sanitizer.  Avoid touching your mouth, face, eyes, or nose.  Cough or sneeze into a tissue or your sleeve or elbow. Do not cough or sneeze into your hand or into the air. Contact a doctor if:  You are getting worse, not better.  You have any of these: ? A fever. ? Chills. ? Brown or red mucus in your nose. ? Yellow or brown fluid (discharge)coming from your nose. ? Pain in your face, especially when you bend forward. ? Swollen neck glands. ? Pain with swallowing. ? White areas in the back of your throat. Get help right away if:  You have shortness of breath that gets worse.  You have very bad or constant: ? Headache. ? Ear pain. ? Pain in your forehead, behind your eyes, and over your cheekbones (sinus pain). ? Chest pain.  You have long-lasting (chronic) lung disease along with any of these: ? Wheezing. ? Long-lasting cough. ? Coughing up blood. ? A change in your usual mucus.  You have a stiff neck.  You have changes in your: ? Vision. ? Hearing. ? Thinking. ? Mood. Summary  An upper respiratory infection (URI) is caused by a germ called a virus. The most common type of URI is often called "the common cold."  URIs usually get better within 7-10 days.  Take over-the-counter and prescription medicines only as told by your doctor. This information is not intended to replace advice given to you by your health care provider. Make sure you discuss any questions you have with your health care provider. Document Released: 10/26/2007 Document Revised: 12/30/2016 Document Reviewed: 12/30/2016 Elsevier Interactive Patient Education  2019 [employer].

## 2018-05-28 NOTE — Progress Notes (Signed)
Subjective:    Patient ID: Catherine Haney, female    DOB: 27-May-1990, 28 y.o.   MRN: [id number]  Chief Complaint:  Positive pregnancy test at home and URI (interpreter Husam #[id number]; headache, congestion, sinus drainage, cough)  Stratus: Unice Cobble #[id number] for evaluation and discharge plan.   HPI: Catherine Haney is a 28 y.o. female presenting on 05/28/2018 for Positive pregnancy test at home and URI (interpreter Husam #[id number]; headache, congestion, sinus drainage, cough)  Pt presents today for positive home pregnancy test and cold symptoms. Pt states her LMP was 04/09/18. She has been taking folic acid at home but no other vitamins. Pt states she has had nausea daily without vomiting. Pt states she has had cold symptoms for 3 days. She has had cough, congestion, and sore throat. She denies fever or chills. She has had rhinorrhea and slight headache. She has not taken anything for her symptoms.  She has an appointment with her OB/GYN in 2 weeks.   Relevant past medical, surgical, family, and social history reviewed and updated as indicated.  Allergies and medications reviewed and updated.   Past Medical History:  Diagnosis Date  . GERD (gastroesophageal reflux disease)     Past Surgical History:  Procedure Laterality Date  . DILATION AND CURETTAGE OF UTERUS N/A 08/25/2017   Procedure: SUCTION DILATATION AND CURETTAGE;  Surgeon: Lazaro Arms, MD;  Location: [facility];  Service: Gynecology;  Laterality: N/A;    Social History   Socioeconomic History  . Marital status: Married    Spouse name: Not on file  . Number of children: Not on file  . Years of education: Not on file  . Highest education level: Not on file  Occupational History  . Not on file  Social Needs  . Financial resource strain: Not on file  . Food insecurity:    Worry: Not on file    Inability: Not on file  . Transportation needs:    Medical: Not on file    Non-medical: Not on file  Tobacco Use  . Smoking status:  Never Smoker  . Smokeless tobacco: Never Used  Substance and Sexual Activity  . Alcohol use: No    Alcohol/week: 0.0 standard drinks  . Drug use: No  . Sexual activity: Yes    Birth control/protection: None  Lifestyle  . Physical activity:    Days per week: Not on file    Minutes per session: Not on file  . Stress: Not on file  Relationships  . Social connections:    Talks on phone: Not on file    Gets together: Not on file    Attends religious service: Not on file    Active member of club or organization: Not on file    Attends meetings of clubs or organizations: Not on file    Relationship status: Not on file  . Intimate partner violence:    Fear of current or ex partner: Not on file    Emotionally abused: Not on file    Physically abused: Not on file    Forced sexual activity: Not on file  Other Topics Concern  . Not on file  Social History Narrative  . Not on file    Outpatient Encounter Medications as of 05/28/2018  Medication Sig  . amoxicillin (AMOXIL) 500 MG tablet Take 500 mg by mouth 2 (two) times daily.  . folic acid (FOLVITE) 1 MG tablet Take 1 mg by mouth daily.  . Prenatal MV-Min-Fe Fum-FA-DHA (PRENATAL 1)  30-0.975-200 MG CAPS Take 1 capsule by mouth daily.   No facility-administered encounter medications on file as of 05/28/2018.     No Known Allergies  Review of Systems  Constitutional: Positive for chills and fatigue. Negative for fever.  HENT: Positive for congestion, rhinorrhea and sore throat. Negative for postnasal drip, sinus pressure and sinus pain.   Respiratory: Positive for cough. Negative for chest tightness and shortness of breath.   Cardiovascular: Negative for chest pain, palpitations and leg swelling.  Gastrointestinal: Positive for nausea. Negative for abdominal pain and vomiting.  Genitourinary: Negative for pelvic pain, vaginal bleeding, vaginal discharge and vaginal pain.  Musculoskeletal: Negative for myalgias.  Neurological: Positive  for headaches. Negative for dizziness, weakness and light-headedness.  Psychiatric/Behavioral: Negative for confusion.  All other systems reviewed and are negative.       Objective:    BP 110/62   Pulse 89   Temp 98.6 F (37 C) (Oral)   Ht 5\' 2"  (1.575 m)   Wt 124 lb (56.2 kg)   LMP 04/09/2018 (Exact Date)   BMI 22.68 kg/m    Wt Readings from Last 3 Encounters:  05/28/18 124 lb (56.2 kg)  04/11/18 128 lb (58.1 kg)  03/21/18 124 lb 6.4 oz (56.4 kg)    Physical Exam Vitals signs and nursing note reviewed.  Constitutional:      General: She is not in acute distress.    Appearance: Normal appearance. She is well-developed, well-groomed and normal weight. She is not ill-appearing.  HENT:     Head: Normocephalic and atraumatic.     Jaw: There is normal jaw occlusion.     Right Ear: Hearing, tympanic membrane, ear canal and external ear normal.     Left Ear: Hearing, tympanic membrane, ear canal and external ear normal.     Nose: Congestion and rhinorrhea present. Rhinorrhea is clear.     Right Turbinates: Swollen.     Left Turbinates: Swollen.     Right Sinus: No maxillary sinus tenderness or frontal sinus tenderness.     Left Sinus: No maxillary sinus tenderness or frontal sinus tenderness.     Mouth/Throat:     Lips: Pink.     Mouth: Mucous membranes are moist.     Pharynx: Posterior oropharyngeal erythema present. No pharyngeal swelling, oropharyngeal exudate or uvula swelling.     Tonsils: No tonsillar exudate or tonsillar abscesses.  Eyes:     General: Lids are normal.     Conjunctiva/sclera: Conjunctivae normal.     Pupils: Pupils are equal, round, and reactive to light.  Neck:     Musculoskeletal: Neck supple.     Trachea: Trachea and phonation normal.  Cardiovascular:     Rate and Rhythm: Normal rate and regular rhythm.     Heart sounds: Normal heart sounds. No murmur. No friction rub. No gallop.   Pulmonary:     Effort: Pulmonary effort is normal.      Breath sounds: Normal breath sounds.  Abdominal:     General: Bowel sounds are normal.     Tenderness: There is no abdominal tenderness.  Lymphadenopathy:     Cervical: No cervical adenopathy.  Skin:    General: Skin is warm and dry.     Capillary Refill: Capillary refill takes less than 2 seconds.  Neurological:     General: No focal deficit present.     Mental Status: She is alert and oriented to person, place, and time.  Psychiatric:  Mood and Affect: Mood normal.        Behavior: Behavior normal. Behavior is cooperative.        Thought Content: Thought content normal.        Judgment: Judgment normal.     Results for orders placed or performed in visit on 05/28/18  Pregnancy, urine  Result Value Ref Range   Preg Test, Ur Positive (A) Negative       Pertinent labs & imaging results that were available during my care of the patient were reviewed by me and considered in my medical decision making.  Assessment & Plan:  Henrine ScrewsDunia was seen today for positive pregnancy test at home and uri.  Diagnoses and all orders for this visit:  Positive pregnancy test Keep appointment with OB/GYN. Prenatal vitamins as prescribed. Only take over the counter medications that are safe during pregnancy as discussed.  -     Pregnancy, urine -     Prenatal MV-Min-Fe Fum-FA-DHA (PRENATAL 1) 30-0.975-200 MG CAPS; Take 1 capsule by mouth daily.  URI with cough and congestion Symptomatic care. Pregnancy safe over the counter medications as discussed. Increase fluid intake. Increase humidity in the air.     Continue all other maintenance medications.  Follow up plan: Return if symptoms worsen or fail to improve.  Educational handout given for Pregnancy safe medications, URI  The above assessment and management plan was discussed with the patient. The patient verbalized understanding of and has agreed to the management plan. Patient is aware to call the clinic if symptoms persist or worsen.  Patient is aware when to return to the clinic for a follow-up visit. Patient educated on when it is appropriate to go to the emergency department.   Kari BaarsMichelle Lyvia Mondesir, FNP-C [facility] [phone]

## 2018-06-05 ENCOUNTER — Ambulatory Visit: Payer: BLUE CROSS/BLUE SHIELD | Admitting: Adult Health

## 2018-06-07 ENCOUNTER — Encounter: Payer: Self-pay | Admitting: Internal Medicine

## 2018-06-09 ENCOUNTER — Other Ambulatory Visit: Payer: Self-pay

## 2018-06-09 ENCOUNTER — Encounter: Payer: Self-pay | Admitting: Internal Medicine

## 2018-06-09 ENCOUNTER — Ambulatory Visit: Payer: Self-pay | Admitting: Internal Medicine

## 2018-06-09 VITALS — BP 102/63 | HR 77 | Temp 96.8°F | Ht 62.0 in | Wt 125.0 lb

## 2018-06-09 NOTE — Progress Notes (Deleted)
History of gastric pain. Prescribed On Omeprazol (20mg ). Pregnant in 1st trimester. Pain is increasing. Stopped medication because of pregnancy.

## 2018-06-09 NOTE — Progress Notes (Signed)
History of gastric pain. Prescribed On Omeprazol (20mg). Pregnant in 1st trimester. Pain is increasing. Stopped medication because of pregnancy.  

## 2018-06-12 ENCOUNTER — Encounter: Payer: Self-pay | Admitting: Adult Health

## 2018-06-12 ENCOUNTER — Ambulatory Visit: Payer: PRIVATE HEALTH INSURANCE | Admitting: Adult Health

## 2018-06-12 VITALS — BP 109/70 | HR 79 | Ht 62.0 in | Wt 125.0 lb

## 2018-06-12 DIAGNOSIS — N926 Irregular menstruation, unspecified: Secondary | ICD-10-CM | POA: Diagnosis not present

## 2018-06-12 DIAGNOSIS — Z3201 Encounter for pregnancy test, result positive: Secondary | ICD-10-CM | POA: Diagnosis not present

## 2018-06-12 DIAGNOSIS — R110 Nausea: Secondary | ICD-10-CM

## 2018-06-12 DIAGNOSIS — Z3A09 9 weeks gestation of pregnancy: Secondary | ICD-10-CM | POA: Insufficient documentation

## 2018-06-12 DIAGNOSIS — O3680X0 Pregnancy with inconclusive fetal viability, not applicable or unspecified: Secondary | ICD-10-CM | POA: Insufficient documentation

## 2018-06-12 LAB — POCT URINE PREGNANCY: PREG TEST UR: POSITIVE — AB

## 2018-06-12 MED ORDER — PRENATAL PLUS 27-1 MG PO TABS: 1.0000 | ORAL_TABLET | Freq: Every day | ORAL | 12 refills | Status: DC

## 2018-06-12 MED ORDER — PROMETHAZINE HCL 25 MG PO TABS: 25.0000 mg | ORAL_TABLET | Freq: Four times a day (QID) | ORAL | 1 refills | Status: DC | PRN

## 2018-06-12 NOTE — Progress Notes (Signed)
Patient ID: Catherine Haney, female   DOB: 1991-04-13, 28 y.o.   MRN: [id number] History of Present Illness:  Catherine Haney is a 28 year old Arabic female, married in for UPT, she has been trying and missed periods and had 2+HPTs.  PCP is Dr Louanne Skye.   Current Medications, Allergies, Past Medical History, Past Surgical History, Family History and Social History were reviewed in [name] record.     Review of Systems: +missed periods with 2+HPTs +nausea Foam in mouth    Physical Exam:BP 109/70 (BP Location: Left Arm, Patient Position: Sitting, Cuff Size: Normal)   Pulse 79   Ht 5\' 2"  (1.575 m)   Wt 125 lb (56.7 kg)   LMP 04/09/2018   BMI 22.86 kg/m   UPT+, about 29+1 week by LMP with EDD 01/15/2019 General:  Well developed, well nourished, no acute distress Skin:  Warm and dry Neck:  Midline trachea, normal thyroid, good ROM, no lymphadenopathy Lungs; Clear to auscultation bilaterally Cardiovascular: Regular rate and rhythm Abdomen:  Soft, non tender Psych:  No mood changes, alert and cooperative,seems happy Fall risk is low. Has interpretor  with her.   Impression: 1. Pregnancy examination or test, positive result   2. [redacted] weeks gestation of pregnancy   3. Encounter to determine fetal viability of pregnancy, single or unspecified fetus   4. Nausea       Plan: Eat small amounts often Meds ordered this encounter  Medications  . prenatal vitamin w/FE, FA (PRENATAL 1 + 1) 27-1 MG TABS tablet    Sig: Take 1 tablet by mouth daily at 12 noon.    Dispense:  30 each    Refill:  12    Order Specific Question:   Supervising Provider    Answer:   Despina Hidden, LUTHER H [[id number]]  . promethazine (PHENERGAN) 25 MG tablet    Sig: Take 1 tablet (25 mg total) by mouth every 6 (six) hours as needed for nausea or vomiting.    Dispense:  30 tablet    Refill:  1    Order Specific Question:   Supervising Provider    Answer:   Lazaro Arms [[id number]]   Review handouts on First  trimester and by [facility] Return in 1 week for dating US/ 2 weeks for New OB

## 2018-06-12 NOTE — Patient Instructions (Signed)
1First Trimester of Pregnancy The first trimester of pregnancy is from week 1 until the end of week 13 (months 1 through 3). A week after a sperm fertilizes an egg, the egg will implant on the wall of the uterus. This embryo will begin to develop into a baby. Genes from you and your partner will form the baby. The female genes will determine whether the baby will be a boy or a girl. At 6-8 weeks, the eyes and face will be formed, and the heartbeat can be seen on ultrasound. At the end of 12 weeks, all the baby's organs will be formed. Now that you are pregnant, you will want to do everything you can to have a healthy baby. Two of the most important things are to get good prenatal care and to follow your health care provider's instructions. Prenatal care is all the medical care you receive before the baby's birth. This care will help prevent, find, and treat any problems during the pregnancy and childbirth. Body changes during your first trimester Your body goes through many changes during pregnancy. The changes vary from woman to woman.  You may gain or lose a couple of pounds at first.  You may feel sick to your stomach (nauseous) and you may throw up (vomit). If the vomiting is uncontrollable, call your health care provider.  You may tire easily.  You may develop headaches that can be relieved by medicines. All medicines should be approved by your health care provider.  You may urinate more often. Painful urination may mean you have a bladder infection.  You may develop heartburn as a result of your pregnancy.  You may develop constipation because certain hormones are causing the muscles that push stool through your intestines to slow down.  You may develop hemorrhoids or swollen veins (varicose veins).  Your breasts may begin to grow larger and become tender. Your nipples may stick out more, and the tissue that surrounds them (areola) may become darker.  Your gums may bleed and may be  sensitive to brushing and flossing.  Dark spots or blotches (chloasma, mask of pregnancy) may develop on your face. This will likely fade after the baby is born.  Your menstrual periods will stop.  You may have a loss of appetite.  You may develop cravings for certain kinds of food.  You may have changes in your emotions from day to day, such as being excited to be pregnant or being concerned that something may go wrong with the pregnancy and baby.  You may have more vivid and strange dreams.  You may have changes in your hair. These can include thickening of your hair, rapid growth, and changes in texture. Some women also have hair loss during or after pregnancy, or hair that feels dry or thin. Your hair will most likely return to normal after your baby is born. What to expect at prenatal visits During a routine prenatal visit:  You will be weighed to make sure you and the baby are growing normally.  Your blood pressure will be taken.  Your abdomen will be measured to track your baby's growth.  The fetal heartbeat will be listened to between weeks 10 and 14 of your pregnancy.  Test results from any previous visits will be discussed. Your health care provider may ask you:  How you are feeling.  If you are feeling the baby move.  If you have had any abnormal symptoms, such as leaking fluid, bleeding, severe headaches, or abdominal  cramping.  If you are using any tobacco products, including cigarettes, chewing tobacco, and electronic cigarettes.  If you have any questions. Other tests that may be performed during your first trimester include:  Blood tests to find your blood type and to check for the presence of any previous infections. The tests will also be used to check for low iron levels (anemia) and protein on red blood cells (Rh antibodies). Depending on your risk factors, or if you previously had diabetes during pregnancy, you may have tests to check for high blood sugar  that affects pregnant women (gestational diabetes).  Urine tests to check for infections, diabetes, or protein in the urine.  An ultrasound to confirm the proper growth and development of the baby.  Fetal screens for spinal cord problems (spina bifida) and Down syndrome.  HIV (human immunodeficiency virus) testing. Routine prenatal testing includes screening for HIV, unless you choose not to have this test.  You may need other tests to make sure you and the baby are doing well. Follow these instructions at home: Medicines  Follow your health care provider's instructions regarding medicine use. Specific medicines may be either safe or unsafe to take during pregnancy.  Take a prenatal vitamin that contains at least 600 micrograms (mcg) of folic acid.  If you develop constipation, try taking a stool softener if your health care provider approves. Eating and drinking   Eat a balanced diet that includes fresh fruits and vegetables, whole grains, good sources of protein such as meat, eggs, or tofu, and low-fat dairy. Your health care provider will help you determine the amount of weight gain that is right for you.  Avoid raw meat and uncooked cheese. These carry germs that can cause birth defects in the baby.  Eating four or five small meals rather than three large meals a day may help relieve nausea and vomiting. If you start to feel nauseous, eating a few soda crackers can be helpful. Drinking liquids between meals, instead of during meals, also seems to help ease nausea and vomiting.  Limit foods that are high in fat and processed sugars, such as fried and sweet foods.  To prevent constipation: ? Eat foods that are high in fiber, such as fresh fruits and vegetables, whole grains, and beans. ? Drink enough fluid to keep your urine clear or pale yellow. Activity  Exercise only as directed by your health care provider. Most women can continue their usual exercise routine during  pregnancy. Try to exercise for 30 minutes at least 5 days a week. Exercising will help you: ? Control your weight. ? Stay in shape. ? Be prepared for labor and delivery.  Experiencing pain or cramping in the lower abdomen or lower back is a good sign that you should stop exercising. Check with your health care provider before continuing with normal exercises.  Try to avoid standing for long periods of time. Move your legs often if you must stand in one place for a long time.  Avoid heavy lifting.  Wear low-heeled shoes and practice good posture.  You may continue to have sex unless your health care provider tells you not to. Relieving pain and discomfort  Wear a good support bra to relieve breast tenderness.  Take warm sitz baths to soothe any pain or discomfort caused by hemorrhoids. Use hemorrhoid cream if your health care provider approves.  Rest with your legs elevated if you have leg cramps or low back pain.  If you develop varicose veins in   your legs, wear support hose. Elevate your feet for 15 minutes, 3-4 times a day. Limit salt in your diet. Prenatal care  Schedule your prenatal visits by the twelfth week of pregnancy. They are usually scheduled monthly at first, then more often in the last 2 months before delivery.  Write down your questions. Take them to your prenatal visits.  Keep all your prenatal visits as told by your health care provider. This is important. Safety  Wear your seat belt at all times when driving.  Make a list of emergency phone numbers, including numbers for family, friends, the hospital, and police and fire departments. General instructions  Ask your health care provider for a referral to a local prenatal education class. Begin classes no later than the beginning of month 6 of your pregnancy.  Ask for help if you have counseling or nutritional needs during pregnancy. Your health care provider can offer advice or refer you to specialists for help  with various needs.  Do not use hot tubs, steam rooms, or saunas.  Do not douche or use tampons or scented sanitary pads.  Do not cross your legs for long periods of time.  Avoid cat litter boxes and soil used by cats. These carry germs that can cause birth defects in the baby and possibly loss of the fetus by miscarriage or stillbirth.  Avoid all smoking, herbs, alcohol, and medicines not prescribed by your health care provider. Chemicals in these products affect the formation and growth of the baby.  Do not use any products that contain nicotine or tobacco, such as cigarettes and e-cigarettes. If you need help quitting, ask your health care provider. You may receive counseling support and other resources to help you quit.  Schedule a dentist appointment. At home, brush your teeth with a soft toothbrush and be gentle when you floss. Contact a health care provider if:  You have dizziness.  You have mild pelvic cramps, pelvic pressure, or nagging pain in the abdominal area.  You have persistent nausea, vomiting, or diarrhea.  You have a bad smelling vaginal discharge.  You have pain when you urinate.  You notice increased swelling in your face, hands, legs, or ankles.  You are exposed to fifth disease or chickenpox.  You are exposed to [place] measles (rubella) and have never had it. Get help right away if:  You have a fever.  You are leaking fluid from your vagina.  You have spotting or bleeding from your vagina.  You have severe abdominal cramping or pain.  You have rapid weight gain or loss.  You vomit blood or material that looks like coffee grounds.  You develop a severe headache.  You have shortness of breath.  You have any kind of trauma, such as from a fall or a car accident. Summary  The first trimester of pregnancy is from week 1 until the end of week 13 (months 1 through 3).  Your body goes through many changes during pregnancy. The changes vary from  woman to woman.  You will have routine prenatal visits. During those visits, your health care provider will examine you, discuss any test results you may have, and talk with you about how you are feeling. This information is not intended to replace advice given to you by your health care provider. Make sure you discuss any questions you have with your health care provider. Document Released: 05/03/2001 Document Revised: 04/20/2016 Document Reviewed: 04/20/2016 Elsevier Interactive Patient Education  2019 [employer].

## 2018-06-20 ENCOUNTER — Ambulatory Visit (INDEPENDENT_AMBULATORY_CARE_PROVIDER_SITE_OTHER): Payer: PRIVATE HEALTH INSURANCE

## 2018-06-20 DIAGNOSIS — O3680X0 Pregnancy with inconclusive fetal viability, not applicable or unspecified: Secondary | ICD-10-CM | POA: Diagnosis not present

## 2018-06-20 NOTE — Progress Notes (Signed)
[place] 10+2 wks,single IUP w/ys,positive fht 173 bpm,small subchorionic hemorrhage 1.5 x .4 x  1.8 cm,crl 31.39 mm,normal ovaries bilat

## 2018-06-23 ENCOUNTER — Ambulatory Visit: Payer: Self-pay | Admitting: Adult Health

## 2018-06-23 NOTE — Patient Instructions (Signed)
Patient left against medical advice.

## 2018-06-23 NOTE — Progress Notes (Signed)
Patient left the clinic before being seen against medical advice.

## 2018-06-25 ENCOUNTER — Ambulatory Visit: Payer: PRIVATE HEALTH INSURANCE | Admitting: Family

## 2018-06-25 ENCOUNTER — Ambulatory Visit (INDEPENDENT_AMBULATORY_CARE_PROVIDER_SITE_OTHER): Payer: PRIVATE HEALTH INSURANCE | Admitting: Physician Assistant

## 2018-06-25 ENCOUNTER — Encounter: Payer: Self-pay | Admitting: Physician Assistant

## 2018-06-25 VITALS — BP 108/71 | HR 89 | Temp 97.0°F | Ht 62.0 in | Wt 121.2 lb

## 2018-06-25 DIAGNOSIS — R509 Fever, unspecified: Secondary | ICD-10-CM

## 2018-06-25 DIAGNOSIS — J101 Influenza due to other identified influenza virus with other respiratory manifestations: Secondary | ICD-10-CM | POA: Diagnosis not present

## 2018-06-25 MED ORDER — OSELTAMIVIR PHOSPHATE 75 MG PO CAPS: 75.0000 mg | ORAL_CAPSULE | Freq: Two times a day (BID) | ORAL | 0 refills | Status: DC

## 2018-06-25 NOTE — Patient Instructions (Signed)

## 2018-06-26 LAB — VERITOR FLU A/B WAIVED
Influenza A: NEGATIVE
Influenza B: POSITIVE — AB

## 2018-06-26 NOTE — Progress Notes (Signed)
BP 108/71   Pulse 89   Temp (!) 97 F (36.1 C) (Oral)   Ht 5\' 2"  (1.575 m)   Wt 121 lb 3.2 oz (55 kg)   LMP 04/09/2018   BMI 22.17 kg/m    Subjective:    Patient ID: Catherine Haney, female    DOB: 11-07-1990, 28 y.o.   MRN: [id number]  HPI: Catherine Haney is a 28 y.o. female presenting on 06/25/2018 for Fever (around 3 months pregnant) and Generalized Body Aches  This patient has had less than 2 days severe fever, chills, myalgias.  Complains of sinus headache and postnasal drainage. There is copious drainage at times. Associated sore throat, decreased appetite and headache.  Has been exposed to influenza.    Past Medical History:  Diagnosis Date  . GERD (gastroesophageal reflux disease)    Relevant past medical, surgical, family and social history reviewed and updated as indicated. Interim medical history since our last visit reviewed. Allergies and medications reviewed and updated. DATA REVIEWED: CHART IN EPIC  Family History reviewed for pertinent findings.  Review of Systems  Constitutional: Positive for appetite change, chills, fatigue and fever. Negative for activity change.  HENT: Positive for congestion, postnasal drip and sore throat.   Eyes: Negative.   Respiratory: Negative for cough and wheezing.   Cardiovascular: Negative.  Negative for chest pain, palpitations and leg swelling.  Gastrointestinal: Negative.   Genitourinary: Negative.   Musculoskeletal: Positive for myalgias.  Skin: Negative.   Neurological: Positive for headaches.    Allergies as of 06/25/2018   No Known Allergies     Medication List       Accurate as of June 25, 2018 11:59 PM. Always use your most recent med list.        folic acid 1 MG tablet Commonly known as:  FOLVITE Take 1 mg by mouth daily.   oseltamivir 75 MG capsule Commonly known as:  TAMIFLU Take 1 capsule (75 mg total) by mouth 2 (two) times daily.   prenatal vitamin w/FE, FA 27-1 MG Tabs tablet Take 1 tablet by  mouth daily at 12 noon.   promethazine 25 MG tablet Commonly known as:  PHENERGAN Take 1 tablet (25 mg total) by mouth every 6 (six) hours as needed for nausea or vomiting.          Objective:    BP 108/71   Pulse 89   Temp (!) 97 F (36.1 C) (Oral)   Ht 5\' 2"  (1.575 m)   Wt 121 lb 3.2 oz (55 kg)   LMP 04/09/2018   BMI 22.17 kg/m   No Known Allergies  Wt Readings from Last 3 Encounters:  06/25/18 121 lb 3.2 oz (55 kg)  06/12/18 125 lb (56.7 kg)  06/09/18 125 lb (56.7 kg)    Physical Exam Vitals signs and nursing note reviewed.  Constitutional:      General: She is not in acute distress.    Appearance: She is well-developed.  HENT:     Head: Normocephalic and atraumatic.     Right Ear: Tympanic membrane normal.     Left Ear: Tympanic membrane normal.     Nose: Mucosal edema and rhinorrhea present.     Right Sinus: No frontal sinus tenderness.     Left Sinus: No frontal sinus tenderness.     Mouth/Throat:     Pharynx: Posterior oropharyngeal erythema present. No oropharyngeal exudate.     Tonsils: No tonsillar abscesses.  Eyes:     Conjunctiva/sclera: Conjunctivae  normal.     Pupils: Pupils are equal, round, and reactive to light.  Neck:     Musculoskeletal: Normal range of motion.  Cardiovascular:     Rate and Rhythm: Normal rate and regular rhythm.     Pulses: Normal pulses.     Heart sounds: Normal heart sounds.  Pulmonary:     Effort: Pulmonary effort is normal. No respiratory distress.     Breath sounds: Normal breath sounds.  Abdominal:     General: Bowel sounds are normal.     Palpations: Abdomen is soft.  Skin:    General: Skin is warm and dry.     Findings: No rash.  Neurological:     Mental Status: She is alert and oriented to person, place, and time.     Deep Tendon Reflexes: Reflexes are normal and symmetric.  Psychiatric:        Behavior: Behavior normal.        Thought Content: Thought content normal.        Judgment: Judgment normal.      Results for orders placed or performed in visit on 06/25/18  Veritor Flu A/B Waived  Result Value Ref Range   Influenza A Negative Negative   Influenza B Positive (A) Negative      Assessment & Plan:   1. Fever, unspecified fever cause - Veritor Flu A/B Waived  2. Influenza B - oseltamivir (TAMIFLU) 75 MG capsule; Take 1 capsule (75 mg total) by mouth 2 (two) times daily.  Dispense: 10 capsule; Refill: 0   Continue all other maintenance medications as listed above.  Follow up plan: No follow-ups on file.  Educational handout given for survey  Remus Loffler PA-C [facility] [address]  [place], [place] [address] [phone]   06/26/2018, 9:13 PM

## 2018-06-28 ENCOUNTER — Ambulatory Visit: Payer: PRIVATE HEALTH INSURANCE | Admitting: Family Medicine

## 2018-06-29 ENCOUNTER — Encounter: Payer: PRIVATE HEALTH INSURANCE | Admitting: Women's Health

## 2018-06-29 ENCOUNTER — Ambulatory Visit: Payer: PRIVATE HEALTH INSURANCE | Admitting: *Deleted

## 2018-07-02 ENCOUNTER — Ambulatory Visit: Payer: PRIVATE HEALTH INSURANCE | Admitting: Family Medicine

## 2018-07-04 ENCOUNTER — Encounter: Payer: Self-pay | Admitting: Family Medicine

## 2018-07-04 ENCOUNTER — Ambulatory Visit (INDEPENDENT_AMBULATORY_CARE_PROVIDER_SITE_OTHER): Payer: PRIVATE HEALTH INSURANCE | Admitting: Family Medicine

## 2018-07-04 VITALS — BP 104/62 | HR 85 | Temp 97.2°F | Ht 62.0 in | Wt 122.0 lb

## 2018-07-04 DIAGNOSIS — M542 Cervicalgia: Secondary | ICD-10-CM | POA: Diagnosis not present

## 2018-07-04 DIAGNOSIS — K219 Gastro-esophageal reflux disease without esophagitis: Secondary | ICD-10-CM | POA: Diagnosis not present

## 2018-07-04 DIAGNOSIS — M549 Dorsalgia, unspecified: Secondary | ICD-10-CM

## 2018-07-04 DIAGNOSIS — R0981 Nasal congestion: Secondary | ICD-10-CM | POA: Diagnosis not present

## 2018-07-04 MED ORDER — RANITIDINE HCL 150 MG PO TABS: 150.0000 mg | ORAL_TABLET | Freq: Two times a day (BID) | ORAL | 3 refills | Status: DC

## 2018-07-04 MED ORDER — FLUTICASONE PROPIONATE 50 MCG/ACT NA SUSP: 1.0000 | Freq: Two times a day (BID) | NASAL | 6 refills | Status: DC | PRN

## 2018-07-04 NOTE — Progress Notes (Signed)
BP 104/62   Pulse 85   Temp (!) 97.2 F (36.2 C) (Oral)   Ht 5\' 2"  (1.575 m)   Wt 122 lb (55.3 kg)   LMP 04/09/2018   BMI 22.31 kg/m    Subjective:    Patient ID: Catherine Haney, female    DOB: 05-Jan-1991, 28 y.o.   MRN: [id number]  HPI: Catherine Haney is a 27 y.o. female presenting on 07/04/2018 for Dizziness (x 10 days- Patient is pregnant); Nausea; and Abdominal Pain   HPI Sinus congestion and postnasal drainage and abdominal discomfort and nausea Patient is currently around [redacted] weeks pregnant and she comes in complaining of sinus congestion and postnasal drainage that is been going on for the past couple days and some dizziness and pressure in her head that is been going on for over a week and then she has been developing some nausea and upper abdominal pain and some vomiting over the past few days as well.  Patient has not used anything over-the-counter because she did not know what she could use during pregnancy  Patient continues to have low back pain and did some physical therapy and wants to know if she can get a new referral for the physical therapy because her back pain is started hurting over the last month especially with pregnancy as well.  She says the pain is mild to moderate in severity  Relevant past medical, surgical, family and social history reviewed and updated as indicated. Interim medical history since our last visit reviewed. Allergies and medications reviewed and updated.  Review of Systems  Constitutional: Negative for chills and fever.  HENT: Positive for congestion, postnasal drip and rhinorrhea. Negative for ear discharge, ear pain, sinus pressure, sneezing and sore throat.   Eyes: Negative for pain, redness and visual disturbance.  Respiratory: Positive for cough. Negative for chest tightness and shortness of breath.   Cardiovascular: Negative for chest pain and leg swelling.  Gastrointestinal: Positive for abdominal pain. Negative for constipation,  diarrhea, nausea and vomiting.  Musculoskeletal: Negative for back pain and gait problem.  Skin: Negative for rash.  Neurological: Negative for light-headedness and headaches.  Psychiatric/Behavioral: Negative for agitation and behavioral problems.  All other systems reviewed and are negative.   Per HPI unless specifically indicated above   Allergies as of 07/04/2018   No Known Allergies     Medication List       Accurate as of July 04, 2018  9:29 AM. Always use your most recent med list.        fluticasone 50 MCG/ACT nasal spray Commonly known as:  FLONASE Place 1 spray into both nostrils 2 (two) times daily as needed for allergies or rhinitis.   folic acid 1 MG tablet Commonly known as:  FOLVITE Take 1 mg by mouth daily.   prenatal vitamin w/FE, FA 27-1 MG Tabs tablet Take 1 tablet by mouth daily at 12 noon.   promethazine 25 MG tablet Commonly known as:  PHENERGAN Take 1 tablet (25 mg total) by mouth every 6 (six) hours as needed for nausea or vomiting.   ranitidine 150 MG tablet Commonly known as:  ZANTAC Take 1 tablet (150 mg total) by mouth 2 (two) times daily.          Objective:    BP 104/62   Pulse 85   Temp (!) 97.2 F (36.2 C) (Oral)   Ht 5\' 2"  (1.575 m)   Wt 122 lb (55.3 kg)   LMP 04/09/2018  BMI 22.31 kg/m   Wt Readings from Last 3 Encounters:  07/04/18 122 lb (55.3 kg)  06/25/18 121 lb 3.2 oz (55 kg)  06/12/18 125 lb (56.7 kg)    Physical Exam Vitals signs and nursing note reviewed.  Constitutional:      General: She is not in acute distress.    Appearance: She is well-developed. She is not diaphoretic.  HENT:     Right Ear: Tympanic membrane normal.     Left Ear: Tympanic membrane normal.     Nose: Congestion present. No rhinorrhea.     Mouth/Throat:     Mouth: Mucous membranes are moist.     Pharynx: No oropharyngeal exudate or posterior oropharyngeal erythema.  Eyes:     Conjunctiva/sclera: Conjunctivae normal.    Cardiovascular:     Rate and Rhythm: Normal rate and regular rhythm.     Heart sounds: Normal heart sounds. No murmur.  Pulmonary:     Effort: Pulmonary effort is normal. No respiratory distress.     Breath sounds: Normal breath sounds. No wheezing.  Abdominal:     General: Abdomen is flat. Bowel sounds are normal. There is no distension.     Palpations: There is no mass.     Tenderness: There is abdominal tenderness (Epigastric tenderness, mild). There is no guarding or rebound.     Hernia: No hernia is present.  Musculoskeletal:        General: Tenderness (Mild lower back tenderness in a bandlike) present.  Skin:    General: Skin is warm and dry.     Findings: No rash.  Neurological:     Mental Status: She is alert and oriented to person, place, and time.     Coordination: Coordination normal.  Psychiatric:        Behavior: Behavior normal.         Assessment & Plan:   Problem List Items Addressed This Visit      Digestive   GERD (gastroesophageal reflux disease)   Relevant Medications   ranitidine (ZANTAC) 150 MG tablet    Other Visit Diagnoses    Sinus congestion    -  Primary   Relevant Medications   fluticasone (FLONASE) 50 MCG/ACT nasal spray   Back pain, unspecified back location, unspecified back pain laterality, unspecified chronicity       Relevant Orders   Ambulatory referral to Physical Therapy   Neck pain       Relevant Orders   Ambulatory referral to Physical Therapy    Will do Zantac and Flonase  Follow up plan: Return if symptoms worsen or fail to improve.  Counseling provided for all of the vaccine components Orders Placed This Encounter  Procedures  . Ambulatory referral to Physical Therapy    Arville CareJoshua Chayim Bialas, MD [facility] 07/04/2018, 9:29 AM

## 2018-07-09 ENCOUNTER — Ambulatory Visit: Payer: PRIVATE HEALTH INSURANCE | Admitting: Family Medicine

## 2018-07-10 ENCOUNTER — Other Ambulatory Visit: Payer: Self-pay

## 2018-07-10 ENCOUNTER — Encounter: Payer: Self-pay | Admitting: Women's Health

## 2018-07-10 ENCOUNTER — Ambulatory Visit: Payer: PRIVATE HEALTH INSURANCE | Admitting: *Deleted

## 2018-07-10 ENCOUNTER — Ambulatory Visit (INDEPENDENT_AMBULATORY_CARE_PROVIDER_SITE_OTHER): Payer: PRIVATE HEALTH INSURANCE | Admitting: Women's Health

## 2018-07-10 VITALS — BP 108/71 | HR 73 | Wt 119.0 lb

## 2018-07-10 DIAGNOSIS — Z3482 Encounter for supervision of other normal pregnancy, second trimester: Secondary | ICD-10-CM | POA: Diagnosis not present

## 2018-07-10 DIAGNOSIS — Z1389 Encounter for screening for other disorder: Secondary | ICD-10-CM

## 2018-07-10 DIAGNOSIS — Z3490 Encounter for supervision of normal pregnancy, unspecified, unspecified trimester: Secondary | ICD-10-CM | POA: Insufficient documentation

## 2018-07-10 DIAGNOSIS — Z3A13 13 weeks gestation of pregnancy: Secondary | ICD-10-CM

## 2018-07-10 DIAGNOSIS — Z331 Pregnant state, incidental: Secondary | ICD-10-CM

## 2018-07-10 HISTORY — DX: Encounter for supervision of normal pregnancy, unspecified, unspecified trimester: Z34.90

## 2018-07-10 LAB — POCT URINALYSIS DIPSTICK OB
Blood, UA: NEGATIVE
Glucose, UA: NEGATIVE
Leukocytes, UA: NEGATIVE
NITRITE UA: NEGATIVE

## 2018-07-10 MED ORDER — DOXYLAMINE-PYRIDOXINE 10-10 MG PO TBEC: DELAYED_RELEASE_TABLET | ORAL | 6 refills | Status: DC

## 2018-07-10 NOTE — Patient Instructions (Addendum)
Catherine Haney, I greatly value your feedback.  If you receive a survey following your visit with [place] today, we appreciate you taking the time to fill it out.  Thanks, Catherine Haney, CNM, [facility]  [facility] IS MOVING!!! to [facility] ([address] [place], [place] [address]) on Sunday July 15, 2018 at 5:00am It will be called the [facility], and it is located off of [address]. DO NOT GO TO [address] (the current [facility]) on February 23rd or after, no one will be there!   For Headaches:   Stay well hydrated, drink enough water so that your urine is clear, sometimes if you are dehydrated you can get headaches  Eat small frequent meals and snacks, sometimes if you are hungry you can get headaches  Sometimes you get headaches during pregnancy from the pregnancy hormones  You can try tylenol (1-2 regular strength 325mg  or 1-2 extra strength 500mg ) as directed on the box. The least amount of medication that works is best.   Cool compresses (cool wet washcloth or ice pack) to area of head that is hurting  You can also try drinking a caffeinated drink to see if this will help  If not helping, try below:  For Prevention of Headaches/Migraines:  CoQ10 100mg  three times daily  Vitamin B2 400mg  daily  Magnesium Oxide 400-600mg  daily  If You Get a Bad Headache/Migraine:  Benadryl 25mg    Magnesium Oxide  1 large Gatorade  2 extra strength Tylenol (1,000mg  total)  1 cup coffee or Coke  If this doesn't help please call us @ [phone]     Nausea & Vomiting  Have saltine crackers or pretzels by your bed and eat a few bites before you raise your head out of bed in the morning  Eat small frequent meals throughout the day instead of large meals  Drink plenty of fluids throughout the day to stay hydrated, just don't drink a lot of fluids with your meals.  This can make your stomach fill up faster making you feel sick  Do not  brush your teeth right after you eat  Products with real ginger are good for nausea, like ginger ale and ginger hard candy Make sure it says made with real ginger!  Sucking on sour candy like lemon heads is also good for nausea  If your prenatal vitamins make you nauseated, take them at night so you will sleep through the nausea  Sea Bands  If you feel like you need medicine for the nausea & vomiting please let us know  If you are unable to keep any fluids or food down please let us know   Constipation  Drink plenty of fluid, preferably water, throughout the day  Eat foods high in fiber such as fruits, vegetables, and grains  Exercise, such as walking, is a good way to keep your bowels regular  Drink warm fluids, especially warm prune juice, or decaf coffee  Eat a 1/2 cup of real oatmeal (not instant), 1/2 cup applesauce, and 1/2-1 cup warm prune juice every day  If needed, you may take Colace (docusate sodium) stool softener once or twice a day to help keep the stool soft. If you are pregnant, wait until you are out of your first trimester (12-14 weeks of pregnancy)  If you still are having problems with constipation, you may take Miralax once daily as needed to help keep your bowels regular.  If you are pregnant, wait  until you are out of your first trimester (12-14 weeks of pregnancy)   First Trimester of Pregnancy The first trimester of pregnancy is from week 1 until the end of week 12 (months 1 through 3). A week after a sperm fertilizes an egg, the egg will implant on the wall of the uterus. This embryo will begin to develop into a baby. Genes from you and your partner are forming the baby. The female genes determine whether the baby is a boy or a girl. At 6-8 weeks, the eyes and face are formed, and the heartbeat can be seen on ultrasound. At the end of 12 weeks, all the baby's organs are formed.  Now that you are pregnant, you will want to do everything you can to have a healthy  baby. Two of the most important things are to get good prenatal care and to follow your health care provider's instructions. Prenatal care is all the medical care you receive before the baby's birth. This care will help prevent, find, and treat any problems during the pregnancy and childbirth. BODY CHANGES Your body goes through many changes during pregnancy. The changes vary from woman to woman.   You may gain or lose a couple of pounds at first.  You may feel sick to your stomach (nauseous) and throw up (vomit). If the vomiting is uncontrollable, call your health care provider.  You may tire easily.  You may develop headaches that can be relieved by medicines approved by your health care provider.  You may urinate more often. Painful urination may mean you have a bladder infection.  You may develop heartburn as a result of your pregnancy.  You may develop constipation because certain hormones are causing the muscles that push waste through your intestines to slow down.  You may develop hemorrhoids or swollen, bulging veins (varicose veins).  Your breasts may begin to grow larger and become tender. Your nipples may stick out more, and the tissue that surrounds them (areola) may become darker.  Your gums may bleed and may be sensitive to brushing and flossing.  Dark spots or blotches (chloasma, mask of pregnancy) may develop on your face. This will likely fade after the baby is born.  Your menstrual periods will stop.  You may have a loss of appetite.  You may develop cravings for certain kinds of food.  You may have changes in your emotions from day to day, such as being excited to be pregnant or being concerned that something may go wrong with the pregnancy and baby.  You may have more vivid and strange dreams.  You may have changes in your hair. These can include thickening of your hair, rapid growth, and changes in texture. Some women also have hair loss during or after  pregnancy, or hair that feels dry or thin. Your hair will most likely return to normal after your baby is born. WHAT TO EXPECT AT YOUR PRENATAL VISITS During a routine prenatal visit:  You will be weighed to make sure you and the baby are growing normally.  Your blood pressure will be taken.  Your abdomen will be measured to track your baby's growth.  The fetal heartbeat will be listened to starting around week 10 or 12 of your pregnancy.  Test results from any previous visits will be discussed. Your health care provider may ask you:  How you are feeling.  If you are feeling the baby move.  If you have had any abnormal symptoms, such as leaking  fluid, bleeding, severe headaches, or abdominal cramping.  If you have any questions. Other tests that may be performed during your first trimester include:  Blood tests to find your blood type and to check for the presence of any previous infections. They will also be used to check for low iron levels (anemia) and Rh antibodies. Later in the pregnancy, blood tests for diabetes will be done along with other tests if problems develop.  Urine tests to check for infections, diabetes, or protein in the urine.  An ultrasound to confirm the proper growth and development of the baby.  An amniocentesis to check for possible genetic problems.  Fetal screens for spina bifida and Down syndrome.  You may need other tests to make sure you and the baby are doing well. HOME CARE INSTRUCTIONS  Medicines  Follow your health care provider's instructions regarding medicine use. Specific medicines may be either safe or unsafe to take during pregnancy.  Take your prenatal vitamins as directed.  If you develop constipation, try taking a stool softener if your health care provider approves. Diet  Eat regular, well-balanced meals. Choose a variety of foods, such as meat or vegetable-based protein, fish, milk and low-fat dairy products, vegetables, fruits,  and whole grain breads and cereals. Your health care provider will help you determine the amount of weight gain that is right for you.  Avoid raw meat and uncooked cheese. These carry germs that can cause birth defects in the baby.  Eating four or five small meals rather than three large meals a day may help relieve nausea and vomiting. If you start to feel nauseous, eating a few soda crackers can be helpful. Drinking liquids between meals instead of during meals also seems to help nausea and vomiting.  If you develop constipation, eat more high-fiber foods, such as fresh vegetables or fruit and whole grains. Drink enough fluids to keep your urine clear or pale yellow. Activity and Exercise  Exercise only as directed by your health care provider. Exercising will help you:  Control your weight.  Stay in shape.  Be prepared for labor and delivery.  Experiencing pain or cramping in the lower abdomen or low back is a good sign that you should stop exercising. Check with your health care provider before continuing normal exercises.  Try to avoid standing for long periods of time. Move your legs often if you must stand in one place for a long time.  Avoid heavy lifting.  Wear low-heeled shoes, and practice good posture.  You may continue to have sex unless your health care provider directs you otherwise. Relief of Pain or Discomfort  Wear a good support bra for breast tenderness.   Take warm sitz baths to soothe any pain or discomfort caused by hemorrhoids. Use hemorrhoid cream if your health care provider approves.   Rest with your legs elevated if you have leg cramps or low back pain.  If you develop varicose veins in your legs, wear support hose. Elevate your feet for 15 minutes, 3-4 times a day. Limit salt in your diet. Prenatal Care  Schedule your prenatal visits by the twelfth week of pregnancy. They are usually scheduled monthly at first, then more often in the last 2 months  before delivery.  Write down your questions. Take them to your prenatal visits.  Keep all your prenatal visits as directed by your health care provider. Safety  Wear your seat belt at all times when driving.  Make a list of emergency phone  numbers, including numbers for family, friends, the hospital, and police and fire departments. General Tips  Ask your health care provider for a referral to a local prenatal education class. Begin classes no later than at the beginning of month 6 of your pregnancy.  Ask for help if you have counseling or nutritional needs during pregnancy. Your health care provider can offer advice or refer you to specialists for help with various needs.  Do not use hot tubs, steam rooms, or saunas.  Do not douche or use tampons or scented sanitary pads.  Do not cross your legs for long periods of time.  Avoid cat litter boxes and soil used by cats. These carry germs that can cause birth defects in the baby and possibly loss of the fetus by miscarriage or stillbirth.  Avoid all smoking, herbs, alcohol, and medicines not prescribed by your health care provider. Chemicals in these affect the formation and growth of the baby.  Schedule a dentist appointment. At home, brush your teeth with a soft toothbrush and be gentle when you floss. SEEK MEDICAL CARE IF:   You have dizziness.  You have mild pelvic cramps, pelvic pressure, or nagging pain in the abdominal area.  You have persistent nausea, vomiting, or diarrhea.  You have a bad smelling vaginal discharge.  You have pain with urination.  You notice increased swelling in your face, hands, legs, or ankles. SEEK IMMEDIATE MEDICAL CARE IF:   You have a fever.  You are leaking fluid from your vagina.  You have spotting or bleeding from your vagina.  You have severe abdominal cramping or pain.  You have rapid weight gain or loss.  You vomit blood or material that looks like coffee grounds.  You are  exposed to [place] measles and have never had them.  You are exposed to fifth disease or chickenpox.  You develop a severe headache.  You have shortness of breath.  You have any kind of trauma, such as from a fall or a car accident. Document Released: 05/03/2001 Document Revised: 09/23/2013 Document Reviewed: 03/19/2013 [facility] Patient Information 2015 [place], [place]. This information is not intended to replace advice given to you by your health care provider. Make sure you discuss any questions you have with your health care provider.

## 2018-07-10 NOTE — Progress Notes (Signed)
INITIAL OBSTETRICAL VISIT Patient name: Catherine Haney MRN [id number]  Date of birth: 02-23-1991 Chief Complaint:   Initial Prenatal Visit  History of Present Illness:   Catherine Haney is a 28 y.o. [id number] female at [redacted]w[redacted]d by LMP c/w 10wk u/s, with an Estimated Date of Delivery: 01/14/19 being seen today for her initial obstetrical visit.   Her obstetrical history is significant for term uncomplicated SVB x 2, SAB x 1.   Today she reports headaches, n/v- requests meds.  Patient's last menstrual period was 04/09/2018. Last pap 2019 w/ PCP. Results were: normal Review of Systems:   Pertinent items are noted in HPI Denies cramping/contractions, leakage of fluid, vaginal bleeding, abnormal vaginal discharge w/ itching/odor/irritation, headaches, visual changes, shortness of breath, chest pain, abdominal pain, severe nausea/vomiting, or problems with urination or bowel movements unless otherwise stated above.  Pertinent History Reviewed:  Reviewed past medical,surgical, social, obstetrical and family history.  Reviewed problem list, medications and allergies. OB History  Gravida Para Term Preterm AB Living  4 2 2   1 2   SAB TAB Ectopic Multiple Live Births  1       2    # Outcome Date GA Lbr Len/2nd Weight Sex Delivery Anes PTL Lv  4 Current           3 SAB 08/2017 [redacted]w[redacted]d            Birth Comments: Missed Ab, CRL [redacted]w[redacted]d, cytotec  2 Term 05/03/13 [redacted]w[redacted]d  9 lb (4.082 kg) M Vag-Spont None N LIV  1 Term 07/23/10 [redacted]w[redacted]d  7 lb (3.175 kg) M Vag-Spont EPI  LIV   Physical Assessment:   Vitals:   07/10/18 0937  BP: 108/71  Pulse: 73  Weight: 119 lb (54 kg)  Body mass index is 21.77 kg/m.       Physical Examination:  General appearance - well appearing, and in no distress  Mental status - alert, oriented to person, place, and time  Psych:  She has a normal mood and affect  Skin - warm and dry, normal color, no suspicious lesions noted  Chest - effort normal, all lung fields clear to auscultation  bilaterally  Heart - normal rate and regular rhythm  Abdomen - soft, nontender  Extremities:  No swelling or varicosities noted  Thin prep pap is not done  Fetal Heart Rate (bpm): +u/s via informal transabdominal u/s, +active fetus  Results for orders placed or performed in visit on 07/10/18 (from the past 24 hour(s))  POC Urinalysis Dipstick OB   Collection Time: 07/10/18 10:50 AM  Result Value Ref Range   Color, UA     Clarity, UA     Glucose, UA Negative Negative   Bilirubin, UA     Ketones, UA trace    Spec Grav, UA     Blood, UA neg    pH, UA     POC,PROTEIN,UA Trace Negative, Trace, Small (1+), Moderate (2+), Large (3+), 4+   Urobilinogen, UA     Nitrite, UA neg    Leukocytes, UA Negative Negative   Appearance     Odor      Assessment & Plan:  1) Low-Risk Pregnancy [id number] at [redacted]w[redacted]d with an Estimated Date of Delivery: 01/14/19   2) Initial OB visit  3) Headaches> gave printed prevention/relief measures   4) N/V> rx diclegis, Bonjesta was non-preferred w/ her insurance  Meds:  Meds ordered this encounter  Medications  . Doxylamine-Pyridoxine 10-10 MG TBEC    Sig: 2  tablets q hs, if sx persist add 1 tab q am on day 3, if sx persist add 1 tab q afternoon on day 4    Dispense:  100 tablet    Refill:  6    Order Specific Question:   Supervising Provider    Answer:   Lazaro Arms [[id number]]    Initial labs obtained Continue prenatal vitamins Reviewed n/v relief measures and warning s/s to report Reviewed recommended weight gain based on pre-gravid BMI Encouraged well-balanced diet Genetic Screening discussed Integrated Screen: declined Cystic fibrosis, Fragile X, SMA screening discussed declined Ultrasound discussed; fetal survey: requested CCNC completed> planning to apply for preg mcaid, PCM not here  Follow-up: Return in about 3 weeks (around 07/31/2018) for LROB.   Orders Placed This Encounter  Procedures  . GC/Chlamydia Probe Amp  . Urine Culture  .  Obstetric Panel, Including HIV  . Urinalysis, Routine w reflex microscopic  . Sickle cell screen  . Pain Management Screening Profile (10S)  . POC Urinalysis Dipstick OB    Cheral Marker CNM, [facility] 07/10/2018 12:29 PM

## 2018-07-11 LAB — PMP SCREEN PROFILE (10S), URINE
Amphetamine Scrn, Ur: NEGATIVE ng/mL
BARBITURATE SCREEN URINE: NEGATIVE ng/mL
BENZODIAZEPINE SCREEN, URINE: NEGATIVE ng/mL
CANNABINOIDS UR QL SCN: NEGATIVE ng/mL
Cocaine (Metab) Scrn, Ur: NEGATIVE ng/mL
Creatinine(Crt), U: 215.8 mg/dL (ref 20.0–300.0)
Methadone Screen, Urine: NEGATIVE ng/mL
OPIATE SCREEN URINE: NEGATIVE ng/mL
OXYCODONE+OXYMORPHONE UR QL SCN: NEGATIVE ng/mL
PHENCYCLIDINE QUANTITATIVE URINE: NEGATIVE ng/mL
Ph of Urine: 6.2 (ref 4.5–8.9)
Propoxyphene Scrn, Ur: NEGATIVE ng/mL

## 2018-07-12 LAB — OBSTETRIC PANEL, INCLUDING HIV
Antibody Screen: NEGATIVE
BASOS ABS: 0 10*3/uL (ref 0.0–0.2)
Basos: 0 %
EOS (ABSOLUTE): 0.1 10*3/uL (ref 0.0–0.4)
Eos: 3 %
HEP B S AG: NEGATIVE
HIV Screen 4th Generation wRfx: NONREACTIVE
Hematocrit: 33 % — ABNORMAL LOW (ref 34.0–46.6)
Hemoglobin: 11.7 g/dL (ref 11.1–15.9)
Immature Grans (Abs): 0 10*3/uL (ref 0.0–0.1)
Immature Granulocytes: 0 %
Lymphocytes Absolute: 1.7 10*3/uL (ref 0.7–3.1)
Lymphs: 33 %
MCH: 29.8 pg (ref 26.6–33.0)
MCHC: 35.5 g/dL (ref 31.5–35.7)
MCV: 84 fL (ref 79–97)
Monocytes Absolute: 0.3 10*3/uL (ref 0.1–0.9)
Monocytes: 6 %
NEUTROS PCT: 58 %
Neutrophils Absolute: 2.9 10*3/uL (ref 1.4–7.0)
Platelets: 297 10*3/uL (ref 150–450)
RBC: 3.93 x10E6/uL (ref 3.77–5.28)
RDW: 12.6 % (ref 11.7–15.4)
RPR Ser Ql: NONREACTIVE
Rh Factor: POSITIVE
Rubella Antibodies, IGG: 12.2 index (ref >0.99)
WBC: 5 10*3/uL (ref 3.4–10.8)

## 2018-07-12 LAB — URINALYSIS, ROUTINE W REFLEX MICROSCOPIC
Bilirubin, UA: NEGATIVE
Glucose, UA: NEGATIVE
Leukocytes, UA: NEGATIVE
NITRITE UA: NEGATIVE
RBC UA: NEGATIVE
Specific Gravity, UA: >=1.03 — AB (ref 1.005–1.030)
Urobilinogen, Ur: 0.2 mg/dL (ref 0.2–1.0)
pH, UA: 6 (ref 5.0–7.5)

## 2018-07-12 LAB — URINE CULTURE: Organism ID, Bacteria: NO GROWTH

## 2018-07-12 LAB — SICKLE CELL SCREEN: Sickle Cell Screen: NEGATIVE

## 2018-07-12 LAB — GC/CHLAMYDIA PROBE AMP
Chlamydia trachomatis, NAA: NEGATIVE
Neisseria gonorrhoeae by PCR: NEGATIVE

## 2018-07-16 ENCOUNTER — Telehealth: Payer: Self-pay | Admitting: *Deleted

## 2018-07-16 NOTE — Telephone Encounter (Signed)
Diclegis approved by insurance. Pharmacy made aware

## 2018-07-23 NOTE — Telephone Encounter (Signed)
Sent to nurse

## 2018-07-25 ENCOUNTER — Ambulatory Visit (INDEPENDENT_AMBULATORY_CARE_PROVIDER_SITE_OTHER): Payer: PRIVATE HEALTH INSURANCE | Admitting: Physician Assistant

## 2018-07-25 ENCOUNTER — Encounter: Payer: Self-pay | Admitting: Physician Assistant

## 2018-07-25 VITALS — BP 94/63 | HR 76 | Temp 98.3°F | Ht 62.0 in | Wt 120.4 lb

## 2018-07-25 DIAGNOSIS — J0100 Acute maxillary sinusitis, unspecified: Secondary | ICD-10-CM | POA: Diagnosis not present

## 2018-07-25 MED ORDER — AMOXICILLIN 500 MG PO CAPS: 500.0000 mg | ORAL_CAPSULE | Freq: Three times a day (TID) | ORAL | 0 refills | Status: DC

## 2018-07-25 NOTE — Progress Notes (Signed)
Interpreter used for encounter Dalia   BP 94/63   Pulse 76   Temp 98.3 F (36.8 C) (Oral)   Ht 5\' 2"  (1.575 m)   Wt 120 lb 6.4 oz (54.6 kg)   LMP 04/09/2018   BMI 22.02 kg/m    Subjective:    Patient ID: Catherine Haney, female    DOB: 02-12-1991, 28 y.o.   MRN: [id number]  HPI: Catherine Haney is a 28 y.o. female presenting on 07/25/2018 for Headache and leg feels cold  This patient has had many days of sinus headache and postnasal drainage. There is copious drainage at times. Denies any fever at this time. There has been a history of sinus infections in the past.  No history of sinus surgery. There is cough at night. It has become more prevalent in recent days.   Past Medical History:  Diagnosis Date  . GERD (gastroesophageal reflux disease)    Relevant past medical, surgical, family and social history reviewed and updated as indicated. Interim medical history since our last visit reviewed. Allergies and medications reviewed and updated. DATA REVIEWED: CHART IN EPIC  Family History reviewed for pertinent findings.  Review of Systems  Constitutional: Positive for chills, fatigue and fever. Negative for activity change and appetite change.  HENT: Positive for congestion, postnasal drip, sinus pressure and sinus pain. Negative for sore throat.   Eyes: Negative.   Respiratory: Negative for cough.   Cardiovascular: Negative.  Negative for chest pain, palpitations and leg swelling.  Gastrointestinal: Negative.   Genitourinary: Negative.   Musculoskeletal: Negative.   Skin: Negative.   Neurological: Positive for headaches.    Allergies as of 07/25/2018   No Known Allergies     Medication List       Accurate as of July 25, 2018  6:32 PM. Always use your most recent med list.        amoxicillin 500 MG capsule Commonly known as:  AMOXIL Take 1 capsule (500 mg total) by mouth 3 (three) times daily.   Doxylamine-Pyridoxine 10-10 MG Tbec 2 tablets q hs, if sx persist add 1  tab q am on day 3, if sx persist add 1 tab q afternoon on day 4   fluticasone 50 MCG/ACT nasal spray Commonly known as:  FLONASE Place 1 spray into both nostrils 2 (two) times daily as needed for allergies or rhinitis.   folic acid 1 MG tablet Commonly known as:  FOLVITE Take 1 mg by mouth daily.   omeprazole 20 MG capsule Commonly known as:  PRILOSEC Take by mouth daily.   prenatal vitamin w/FE, FA 27-1 MG Tabs tablet Take 1 tablet by mouth daily at 12 noon.   promethazine 25 MG tablet Commonly known as:  PHENERGAN Take 1 tablet (25 mg total) by mouth every 6 (six) hours as needed for nausea or vomiting.   ranitidine 150 MG tablet Commonly known as:  ZANTAC Take 1 tablet (150 mg total) by mouth 2 (two) times daily.          Objective:    BP 94/63   Pulse 76   Temp 98.3 F (36.8 C) (Oral)   Ht 5\' 2"  (1.575 m)   Wt 120 lb 6.4 oz (54.6 kg)   LMP 04/09/2018   BMI 22.02 kg/m   No Known Allergies  Wt Readings from Last 3 Encounters:  07/25/18 120 lb 6.4 oz (54.6 kg)  07/10/18 119 lb (54 kg)  07/04/18 122 lb (55.3 kg)    Physical Exam Constitutional:  Appearance: She is well-developed.  HENT:     Head: Normocephalic and atraumatic.     Right Ear: Tympanic membrane and external ear normal. No middle ear effusion.     Left Ear: Tympanic membrane and external ear normal.  No middle ear effusion.     Nose: Mucosal edema and rhinorrhea present.     Right Sinus: Maxillary sinus tenderness and frontal sinus tenderness present.     Left Sinus: No maxillary sinus tenderness.     Mouth/Throat:     Pharynx: Uvula midline. Posterior oropharyngeal erythema present.  Eyes:     General:        Right eye: No discharge.        Left eye: No discharge.     Conjunctiva/sclera: Conjunctivae normal.     Pupils: Pupils are equal, round, and reactive to light.  Neck:     Musculoskeletal: Normal range of motion.  Cardiovascular:     Rate and Rhythm: Normal rate and regular  rhythm.     Heart sounds: Normal heart sounds.  Pulmonary:     Effort: Pulmonary effort is normal. No respiratory distress.     Breath sounds: Normal breath sounds. No wheezing.  Abdominal:     Palpations: Abdomen is soft.  Lymphadenopathy:     Cervical: No cervical adenopathy.  Skin:    General: Skin is warm and dry.  Neurological:     Mental Status: She is alert and oriented to person, place, and time.         Assessment & Plan:   1. Acute non-recurrent maxillary sinusitis - amoxicillin (AMOXIL) 500 MG capsule; Take 1 capsule (500 mg total) by mouth 3 (three) times daily.  Dispense: 30 capsule; Refill: 0   Continue all other maintenance medications as listed above.  Follow up plan: No follow-ups on file.  Educational handout given for survey  Remus Loffler PA-C [facility] [address]  [place], [place] [address] [phone]   07/25/2018, 6:32 PM

## 2018-07-31 ENCOUNTER — Encounter: Payer: Self-pay | Admitting: Obstetrics & Gynecology

## 2018-07-31 ENCOUNTER — Ambulatory Visit (INDEPENDENT_AMBULATORY_CARE_PROVIDER_SITE_OTHER): Payer: Medicaid Other | Admitting: Obstetrics & Gynecology

## 2018-07-31 VITALS — BP 103/69 | HR 86 | Wt 120.5 lb

## 2018-07-31 DIAGNOSIS — Z1389 Encounter for screening for other disorder: Secondary | ICD-10-CM

## 2018-07-31 DIAGNOSIS — Z3482 Encounter for supervision of other normal pregnancy, second trimester: Secondary | ICD-10-CM

## 2018-07-31 DIAGNOSIS — Z331 Pregnant state, incidental: Secondary | ICD-10-CM

## 2018-07-31 DIAGNOSIS — Z3A16 16 weeks gestation of pregnancy: Secondary | ICD-10-CM

## 2018-07-31 LAB — POCT URINALYSIS DIPSTICK OB
Glucose, UA: NEGATIVE
KETONES UA: NEGATIVE
Leukocytes, UA: NEGATIVE
Nitrite, UA: NEGATIVE
POC,PROTEIN,UA: NEGATIVE

## 2018-07-31 NOTE — Progress Notes (Signed)
   LOW-RISK PREGNANCY VISIT  Arabic interpreter was present for this appointment  Patient name: Catherine Haney MRN [id number]  Date of birth: [birth year] Chief Complaint:   Routine Prenatal Visit  History of Present Illness:   Catherine Haney is a 28 y.o. [id number] female at [redacted]w[redacted]d with an Estimated Date of Delivery: 01/14/19 being seen today for ongoing management of a low-risk pregnancy.  Today she reports headaches.  . Vag. Bleeding: None.   . denies leaking of fluid. Review of Systems:   Pertinent items are noted in HPI Denies abnormal vaginal discharge w/ itching/odor/irritation, headaches, visual changes, shortness of breath, chest pain, abdominal pain, severe nausea/vomiting, or problems with urination or bowel movements unless otherwise stated above. Pertinent History Reviewed:  Reviewed past medical,surgical, social, obstetrical and family history.  Reviewed problem list, medications and allergies. Physical Assessment:   Vitals:   07/31/18 0845  BP: 103/69  Pulse: 86  Weight: 120 lb 8 oz (54.7 kg)  Body mass index is 22.04 kg/m.        Physical Examination:   General appearance: Well appearing, and in no distress  Mental status: Alert, oriented to person, place, and time  Skin: Warm & dry  Cardiovascular: Normal heart rate noted  Respiratory: Normal respiratory effort, no distress  Abdomen: Soft, gravid, nontender  Pelvic: Cervical exam deferred         Extremities: Edema: None  Fetal Status: Fetal Heart Rate (bpm): 163        Results for orders placed or performed in visit on 07/31/18 (from the past 24 hour(s))  POC Urinalysis Dipstick OB   Collection Time: 07/31/18  8:46 AM  Result Value Ref Range   Color, UA     Clarity, UA     Glucose, UA Negative Negative   Bilirubin, UA     Ketones, UA neg    Spec Grav, UA     Blood, UA trace    pH, UA     POC,PROTEIN,UA Negative Negative, Trace, Small (1+), Moderate (2+), Large (3+), 4+   Urobilinogen, UA     Nitrite, UA  neg    Leukocytes, UA Negative Negative   Appearance     Odor      Assessment & Plan:  1) Low-risk pregnancy [id number] at [redacted]w[redacted]d with an Estimated Date of Delivery: 01/14/19   2) ,    Meds: No orders of the defined types were placed in this encounter.  Labs/procedures today: none  Plan:  Continue routine obstetrical care sonogram 3 weeks  Reviewed:  labor symptoms and general obstetric precautions including but not limited to vaginal bleeding, contractions, leaking of fluid and fetal movement were reviewed in detail with the patient.  All questions were answered  Follow-up: Return in about 3 weeks (around 08/21/2018) for 20 week sono, LROB.  Orders Placed This Encounter  Procedures  . US OB Comp + 14 Wk  . POC Urinalysis Dipstick OB   Amaryllis Dyke Meika Earll  07/31/2018 9:05 AM

## 2018-08-20 ENCOUNTER — Telehealth: Payer: Self-pay | Admitting: *Deleted

## 2018-08-20 ENCOUNTER — Other Ambulatory Visit: Payer: Self-pay | Admitting: Obstetrics & Gynecology

## 2018-08-20 DIAGNOSIS — Z3482 Encounter for supervision of other normal pregnancy, second trimester: Secondary | ICD-10-CM

## 2018-08-20 DIAGNOSIS — Z363 Encounter for antenatal screening for malformations: Secondary | ICD-10-CM

## 2018-08-20 NOTE — Telephone Encounter (Signed)
Pt informed that no visitors or children are allowed to come to appt with her.  Pt denies coming in contact with anyone who in the last month has been confirmed or suspected of having covid-19. Pt denies fever, cough, SOB, muscle pain, diarrhea, rash, vomiting, abdominal pain, red eye, weakness, bruising, bleeding, joint pain or severe headache.

## 2018-08-21 ENCOUNTER — Encounter: Payer: Self-pay | Admitting: Women's Health

## 2018-08-21 ENCOUNTER — Ambulatory Visit (INDEPENDENT_AMBULATORY_CARE_PROVIDER_SITE_OTHER): Payer: Medicaid Other

## 2018-08-21 ENCOUNTER — Other Ambulatory Visit: Payer: Self-pay

## 2018-08-21 ENCOUNTER — Ambulatory Visit (INDEPENDENT_AMBULATORY_CARE_PROVIDER_SITE_OTHER): Payer: Medicaid Other | Admitting: Women's Health

## 2018-08-21 VITALS — BP 99/68 | HR 85 | Temp 98.1°F | Wt 121.5 lb

## 2018-08-21 DIAGNOSIS — Z3482 Encounter for supervision of other normal pregnancy, second trimester: Secondary | ICD-10-CM

## 2018-08-21 DIAGNOSIS — Z3A19 19 weeks gestation of pregnancy: Secondary | ICD-10-CM | POA: Diagnosis not present

## 2018-08-21 DIAGNOSIS — Z331 Pregnant state, incidental: Secondary | ICD-10-CM

## 2018-08-21 DIAGNOSIS — Z363 Encounter for antenatal screening for malformations: Secondary | ICD-10-CM

## 2018-08-21 DIAGNOSIS — Z1389 Encounter for screening for other disorder: Secondary | ICD-10-CM

## 2018-08-21 LAB — POCT URINALYSIS DIPSTICK OB
Glucose, UA: NEGATIVE
KETONES UA: NEGATIVE
Nitrite, UA: NEGATIVE
PROTEIN: NEGATIVE

## 2018-08-21 NOTE — Patient Instructions (Signed)
Catherine Haney, I greatly value your feedback.  If you receive a survey following your visit with [place] today, we appreciate you taking the time to fill it out.  Thanks, [name], CNM, [facility]  [facility] H[facility]ITAL HAS MOVED!!! It is now [facility] ([address] [place], [place] [address]) Entrance located off of [address] [facility] Free 24/7 valet parking   Check your blood pressure once a week. If top > or = 140 or bottom > or = 90, call and let us know.    Second Trimester of Pregnancy The second trimester is from week 14 through week 27 (months 4 through 6). The second trimester is often a time when you feel your best. Your body has adjusted to being pregnant, and you begin to feel better physically. Usually, morning sickness has lessened or quit completely, you may have more energy, and you may have an increase in appetite. The second trimester is also a time when the fetus is growing rapidly. At the end of the sixth month, the fetus is about 9 inches long and weighs about 1 pounds. You will likely begin to feel the baby move (quickening) between 16 and 20 weeks of pregnancy. Body changes during your second trimester Your body continues to go through many changes during your second trimester. The changes vary from woman to woman.  Your weight will continue to increase. You will notice your lower abdomen bulging out.  You may begin to get stretch marks on your hips, abdomen, and breasts.  You may develop headaches that can be relieved by medicines. The medicines should be approved by your health care provider.  You may urinate more often because the fetus is pressing on your bladder.  You may develop or continue to have heartburn as a result of your pregnancy.  You may develop constipation because certain hormones are causing the muscles that push waste through your intestines to slow down.  You may develop hemorrhoids or swollen, bulging veins (varicose veins).   You may have back pain. This is caused by: ? Weight gain. ? Pregnancy hormones that are relaxing the joints in your pelvis. ? A shift in weight and the muscles that support your balance.  Your breasts will continue to grow and they will continue to become tender.  Your gums may bleed and may be sensitive to brushing and flossing.  Dark spots or blotches (chloasma, mask of pregnancy) may develop on your face. This will likely fade after the baby is born.  A dark line from your belly button to the pubic area (linea nigra) may appear. This will likely fade after the baby is born.  You may have changes in your hair. These can include thickening of your hair, rapid growth, and changes in texture. Some women also have hair loss during or after pregnancy, or hair that feels dry or thin. Your hair will most likely return to normal after your baby is born.  What to expect at prenatal visits During a routine prenatal visit:  You will be weighed to make sure you and the fetus are growing normally.  Your blood pressure will be taken.  Your abdomen will be measured to track your baby's growth.  The fetal heartbeat will be listened to.  Any test results from the previous visit will be discussed.  Your health care provider may ask you:  How you are feeling.  If you are feeling the baby move.  If you have had  any abnormal symptoms, such as leaking fluid, bleeding, severe headaches, or abdominal cramping.  If you are using any tobacco products, including cigarettes, chewing tobacco, and electronic cigarettes.  If you have any questions.  Other tests that may be performed during your second trimester include:  Blood tests that check for: ? Low iron levels (anemia). ? High blood sugar that affects pregnant women (gestational diabetes) between 64 and 28 weeks. ? Rh antibodies. This is to check for a protein on red blood cells (Rh factor).  Urine tests to check for infections, diabetes, or  protein in the urine.  An ultrasound to confirm the proper growth and development of the baby.  An amniocentesis to check for possible genetic problems.  Fetal screens for spina bifida and Down syndrome.  HIV (human immunodeficiency virus) testing. Routine prenatal testing includes screening for HIV, unless you choose not to have this test.  Follow these instructions at home: Medicines  Follow your health care provider's instructions regarding medicine use. Specific medicines may be either safe or unsafe to take during pregnancy.  Take a prenatal vitamin that contains at least 600 micrograms (mcg) of folic acid.  If you develop constipation, try taking a stool softener if your health care provider approves. Eating and drinking  Eat a balanced diet that includes fresh fruits and vegetables, whole grains, good sources of protein such as meat, eggs, or tofu, and low-fat dairy. Your health care provider will help you determine the amount of weight gain that is right for you.  Avoid raw meat and uncooked cheese. These carry germs that can cause birth defects in the baby.  If you have low calcium intake from food, talk to your health care provider about whether you should take a daily calcium supplement.  Limit foods that are high in fat and processed sugars, such as fried and sweet foods.  To prevent constipation: ? Drink enough fluid to keep your urine clear or pale yellow. ? Eat foods that are high in fiber, such as fresh fruits and vegetables, whole grains, and beans. Activity  Exercise only as directed by your health care provider. Most women can continue their usual exercise routine during pregnancy. Try to exercise for 30 minutes at least 5 days a week. Stop exercising if you experience uterine contractions.  Avoid heavy lifting, wear low heel shoes, and practice good posture.  A sexual relationship may be continued unless your health care provider directs you otherwise.  Relieving pain and discomfort  Wear a good support bra to prevent discomfort from breast tenderness.  Take warm sitz baths to soothe any pain or discomfort caused by hemorrhoids. Use hemorrhoid cream if your health care provider approves.  Rest with your legs elevated if you have leg cramps or low back pain.  If you develop varicose veins, wear support hose. Elevate your feet for 15 minutes, 3-4 times a day. Limit salt in your diet. Prenatal Care  Write down your questions. Take them to your prenatal visits.  Keep all your prenatal visits as told by your health care provider. This is important. Safety  Wear your seat belt at all times when driving.  Make a list of emergency phone numbers, including numbers for family, friends, the hospital, and police and fire departments. General instructions  Ask your health care provider for a referral to a local prenatal education class. Begin classes no later than the beginning of month 6 of your pregnancy.  Ask for help if you have counseling or nutritional  needs during pregnancy. Your health care provider can offer advice or refer you to specialists for help with various needs.  Do not use hot tubs, steam rooms, or saunas.  Do not douche or use tampons or scented sanitary pads.  Do not cross your legs for long periods of time.  Avoid cat litter boxes and soil used by cats. These carry germs that can cause birth defects in the baby and possibly loss of the fetus by miscarriage or stillbirth.  Avoid all smoking, herbs, alcohol, and unprescribed drugs. Chemicals in these products can affect the formation and growth of the baby.  Do not use any products that contain nicotine or tobacco, such as cigarettes and e-cigarettes. If you need help quitting, ask your health care provider.  Visit your dentist if you have not gone yet during your pregnancy. Use a soft toothbrush to brush your teeth and be gentle when you floss. Contact a health care  provider if:  You have dizziness.  You have mild pelvic cramps, pelvic pressure, or nagging pain in the abdominal area.  You have persistent nausea, vomiting, or diarrhea.  You have a bad smelling vaginal discharge.  You have pain when you urinate. Get help right away if:  You have a fever.  You are leaking fluid from your vagina.  You have spotting or bleeding from your vagina.  You have severe abdominal cramping or pain.  You have rapid weight gain or weight loss.  You have shortness of breath with chest pain.  You notice sudden or extreme swelling of your face, hands, ankles, feet, or legs.  You have not felt your baby move in over an hour.  You have severe headaches that do not go away when you take medicine.  You have vision changes. Summary  The second trimester is from week 14 through week 27 (months 4 through 6). It is also a time when the fetus is growing rapidly.  Your body goes through many changes during pregnancy. The changes vary from woman to woman.  Avoid all smoking, herbs, alcohol, and unprescribed drugs. These chemicals affect the formation and growth your baby.  Do not use any tobacco products, such as cigarettes, chewing tobacco, and e-cigarettes. If you need help quitting, ask your health care provider.  Contact your health care provider if you have any questions. Keep all prenatal visits as told by your health care provider. This is important. This information is not intended to replace advice given to you by your health care provider. Make sure you discuss any questions you have with your health care provider. Document Released: 05/03/2001 Document Revised: 10/15/2015 Document Reviewed: 07/10/2012 Elsevier Interactive Patient Education  2017 [employer].

## 2018-08-21 NOTE — Progress Notes (Signed)
   LOW-RISK PREGNANCY VISIT Patient name: Catherine Haney MRN [id number]  Date of birth: 1990-11-21 Chief Complaint:   Routine Prenatal Visit ([place] today)  History of Present Illness:   Catherine Haney is a 28 y.o. [phone] female at [redacted]w[redacted]d with an Estimated Date of Delivery: 01/14/19 being seen today for ongoing management of a low-risk pregnancy.  Today she reports no complaints. Does not have access to bp cuff. Contractions: Not present. Vag. Bleeding: None.  Movement: Present. denies leaking of fluid. Review of Systems:   Pertinent items are noted in HPI Denies abnormal vaginal discharge w/ itching/odor/irritation, headaches, visual changes, shortness of breath, chest pain, abdominal pain, severe nausea/vomiting, or problems with urination or bowel movements unless otherwise stated above. Pertinent History Reviewed:  Reviewed past medical,surgical, social, obstetrical and family history.  Reviewed problem list, medications and allergies. Physical Assessment:   Vitals:   08/21/18 0943  BP: 99/68  Pulse: 85  Temp: 98.1 F (36.7 C)  Weight: 121 lb 8 oz (55.1 kg)  Body mass index is 22.22 kg/m.        Physical Examination:   General appearance: Well appearing, and in no distress  Mental status: Alert, oriented to person, place, and time  Skin: Warm & dry  Cardiovascular: Normal heart rate noted  Respiratory: Normal respiratory effort, no distress  Abdomen: Soft, gravid, nontender  Pelvic: Cervical exam deferred         Extremities: Edema: None  Fetal Status: Fetal Heart Rate (bpm): 157 u/s   Movement: Present    Results for orders placed or performed in visit on 08/21/18 (from the past 24 hour(s))  POC Urinalysis Dipstick OB   Collection Time: 08/21/18  9:44 AM  Result Value Ref Range   Color, UA     Clarity, UA     Glucose, UA Negative Negative   Bilirubin, UA     Ketones, UA neg    Spec Grav, UA     Blood, UA 1+    pH, UA     POC,PROTEIN,UA Negative Negative, Trace, Small  (1+), Moderate (2+), Large (3+), 4+   Urobilinogen, UA     Nitrite, UA neg    Leukocytes, UA Trace (A) Negative   Appearance     Odor      Assessment & Plan:  1) Low-risk pregnancy [id number] at [redacted]w[redacted]d with an Estimated Date of Delivery: 01/14/19   Meds: No orders of the defined types were placed in this encounter.  Labs/procedures today: anatomy u/s  Plan:  Continue routine obstetrical care   Reviewed: Preterm labor symptoms and general obstetric precautions including but not limited to vaginal bleeding, contractions, leaking of fluid and fetal movement were reviewed in detail with the patient.  All questions were answered  Follow-up: Return in about 4 weeks (around 09/18/2018) for tele visit. d/t coronavirus. Gave bp cuff from office, to check weekly, let us know if >140/90 Used Arabic interpreter for visit today  Orders Placed This Encounter  Procedures  . POC Urinalysis Dipstick OB   Cheral Marker CNM, [facility] 08/21/2018 10:41 AM

## 2018-08-21 NOTE — Progress Notes (Signed)
[place] 19+1 wks,breech,anterior placenta gr 0,normal ovaries bilat,fhr 157,cx 2.7 cm,svp of fluid 5.2 cm,efw 269 g 37 %,anatomy complete,no obvious abnormalities

## 2018-08-24 ENCOUNTER — Other Ambulatory Visit: Payer: Self-pay

## 2018-08-24 ENCOUNTER — Encounter: Payer: Self-pay | Admitting: Family Medicine

## 2018-08-24 ENCOUNTER — Ambulatory Visit (INDEPENDENT_AMBULATORY_CARE_PROVIDER_SITE_OTHER): Payer: Medicaid Other | Admitting: Family Medicine

## 2018-08-24 DIAGNOSIS — N949 Unspecified condition associated with female genital organs and menstrual cycle: Secondary | ICD-10-CM | POA: Diagnosis not present

## 2018-08-24 MED ORDER — ACETAMINOPHEN 500 MG PO TABS: 500.0000 mg | ORAL_TABLET | Freq: Four times a day (QID) | ORAL | 2 refills | Status: DC | PRN

## 2018-08-24 NOTE — Progress Notes (Signed)
Virtual Visit via telephone Note  I connected with Catherine Haney on 08/24/18 at 1123 by telephone and verified that I am speaking with the correct person using two identifiers. Catherine Haney is currently located at home and interpreter [phone] are currently with her during visit. The provider, Elige Radon Adaly Puder, MD is located in their office at time of visit.  Call ended at 1144  I discussed the limitations, risks, security and privacy concerns of performing an evaluation and management service by telephone and the availability of in person appointments. I also discussed with the patient that there may be a patient responsible charge related to this service. The patient expressed understanding and agreed to proceed.   History and Present Illness: Pain in lower back and lower abdomen.  Patient is currently pregnant at [redacted] weeks. The pain also radiates into legs. Baby is moving.  She feels like the cramping is similar to . Saw ob gyn Tuesday and everything was normal.  She denies any vaginal pain or discharge. She denies any fevers or chillls.  Right ear pressure started yesterday but no other respiratory symptoms. She has not tried any over the counter medicaitons   No diagnosis found.  Outpatient Encounter Medications as of 08/24/2018  Medication Sig  . Acetaminophen (TYLENOL PO) Take by mouth as needed.  . folic acid (FOLVITE) 1 MG tablet Take 1 mg by mouth daily.  . prenatal vitamin w/FE, FA (PRENATAL 1 + 1) 27-1 MG TABS tablet Take 1 tablet by mouth daily at 12 noon.   No facility-administered encounter medications on file as of 08/24/2018.     Review of Systems  Constitutional: Negative for chills and fever.  Eyes: Negative for visual disturbance.  Respiratory: Negative for chest tightness and shortness of breath.   Cardiovascular: Negative for chest pain and leg swelling.  Gastrointestinal: Positive for abdominal pain. Negative for abdominal distention, blood in stool, constipation,  diarrhea, nausea, rectal pain and vomiting.  Genitourinary: Negative for decreased urine volume, dysuria, flank pain, frequency, pelvic pain, urgency, vaginal bleeding, vaginal discharge and vaginal pain.  Musculoskeletal: Positive for back pain. Negative for gait problem.  Skin: Negative for rash.  Neurological: Negative for dizziness, light-headedness and headaches.  Psychiatric/Behavioral: Negative for agitation and behavioral problems.  All other systems reviewed and are negative.   Observations/Objective: Patient sounds comfortable on the phone  Assessment and Plan: Problem List Items Addressed This Visit    None    Visit Diagnoses    Round ligament pain    -  Primary   Relevant Medications   acetaminophen (TYLENOL) 500 MG tablet       Follow Up Instructions:  Patient likely has round ligament pain but if she develops any urinary burning or pain or vaginal bleeding or pain then to call us or the obstetrician   She is feeling baby move and not having any urinary symptoms or any red flags currently but warned her of what all the red flags were and to give [place] a call or give the obstetrician a call if she develops any of them.   I discussed the assessment and treatment plan with the patient. The patient was provided an opportunity to ask questions and all were answered. The patient agreed with the plan and demonstrated an understanding of the instructions.   The patient was advised to call back or seek an in-person evaluation if the symptoms worsen or if the condition fails to improve as anticipated.  The above assessment and management  plan was discussed with the patient. The patient verbalized understanding of and has agreed to the management plan. Patient is aware to call the clinic if symptoms persist or worsen. Patient is aware when to return to the clinic for a follow-up visit. Patient educated on when it is appropriate to go to the emergency department.    I provided 21  minutes of non-face-to-face time during this encounter.  [phone] interpreter  [name], MD

## 2018-09-05 ENCOUNTER — Other Ambulatory Visit: Payer: Self-pay | Admitting: Obstetrics and Gynecology

## 2018-09-05 ENCOUNTER — Telehealth: Payer: Self-pay | Admitting: *Deleted

## 2018-09-05 ENCOUNTER — Telehealth: Payer: Self-pay | Admitting: Women's Health

## 2018-09-05 MED ORDER — SULFAMETHOXAZOLE-TRIMETHOPRIM 400-80 MG PO TABS: 1.0000 | ORAL_TABLET | Freq: Two times a day (BID) | ORAL | 0 refills | Status: DC

## 2018-09-05 NOTE — Telephone Encounter (Signed)
Patient called stating that she would like a call back from the nurse with an arabic tanslater. Pt did not state the reason for the call. Please contact pt

## 2018-09-05 NOTE — Telephone Encounter (Signed)
Interpreter [phone]   Patient is reporting cramping x1 week that comes and goes mostly in the morning after waking up.  Also having burning with urination, no lower back pain or fever.  Can she be treated for possible UTI without coming for urine dip? Please advise.

## 2018-09-05 NOTE — Telephone Encounter (Signed)
I will treat with septra x 5 days for presumed uti.

## 2018-09-05 NOTE — Progress Notes (Signed)
Treated with Septra x 5 days for probable uti.

## 2018-09-09 DIAGNOSIS — M542 Cervicalgia: Secondary | ICD-10-CM | POA: Diagnosis not present

## 2018-09-10 ENCOUNTER — Encounter (HOSPITAL_COMMUNITY): Payer: Self-pay

## 2018-09-10 ENCOUNTER — Other Ambulatory Visit: Payer: Self-pay

## 2018-09-10 ENCOUNTER — Inpatient Hospital Stay (HOSPITAL_COMMUNITY)
Admission: AD | Admit: 2018-09-10 | Discharge: 2018-09-10 | Disposition: A | Discharge status: Home / Self Care | Payer: Medicaid Other | Attending: Obstetrics & Gynecology | Admitting: Obstetrics & Gynecology

## 2018-09-10 DIAGNOSIS — Z3A28 28 weeks gestation of pregnancy: Secondary | ICD-10-CM | POA: Diagnosis not present

## 2018-09-10 DIAGNOSIS — R300 Dysuria: Secondary | ICD-10-CM | POA: Insufficient documentation

## 2018-09-10 DIAGNOSIS — M542 Cervicalgia: Secondary | ICD-10-CM | POA: Insufficient documentation

## 2018-09-10 DIAGNOSIS — O23593 Infection of other part of genital tract in pregnancy, third trimester: Secondary | ICD-10-CM

## 2018-09-10 DIAGNOSIS — M549 Dorsalgia, unspecified: Secondary | ICD-10-CM | POA: Diagnosis not present

## 2018-09-10 DIAGNOSIS — M79603 Pain in arm, unspecified: Secondary | ICD-10-CM | POA: Diagnosis not present

## 2018-09-10 DIAGNOSIS — Z3A22 22 weeks gestation of pregnancy: Secondary | ICD-10-CM | POA: Diagnosis not present

## 2018-09-10 DIAGNOSIS — R109 Unspecified abdominal pain: Secondary | ICD-10-CM | POA: Diagnosis not present

## 2018-09-10 DIAGNOSIS — B9689 Other specified bacterial agents as the cause of diseases classified elsewhere: Secondary | ICD-10-CM | POA: Diagnosis not present

## 2018-09-10 DIAGNOSIS — O26893 Other specified pregnancy related conditions, third trimester: Secondary | ICD-10-CM | POA: Diagnosis not present

## 2018-09-10 DIAGNOSIS — O26892 Other specified pregnancy related conditions, second trimester: Secondary | ICD-10-CM | POA: Diagnosis not present

## 2018-09-10 DIAGNOSIS — O23599 Infection of other part of genital tract in pregnancy, unspecified trimester: Secondary | ICD-10-CM

## 2018-09-10 LAB — URINALYSIS, ROUTINE W REFLEX MICROSCOPIC
Bilirubin Urine: NEGATIVE
Glucose, UA: NEGATIVE mg/dL
Hgb urine dipstick: NEGATIVE
Ketones, ur: 20 mg/dL — AB
Leukocytes,Ua: NEGATIVE
Nitrite: NEGATIVE
Protein, ur: NEGATIVE mg/dL
Specific Gravity, Urine: 1.025 (ref 1.005–1.030)
pH: 5 (ref 5.0–8.0)

## 2018-09-10 LAB — WET PREP, GENITAL
Sperm: NONE SEEN
Trich, Wet Prep: NONE SEEN
Yeast Wet Prep HPF POC: NONE SEEN

## 2018-09-10 MED ORDER — ACETAMINOPHEN 500 MG PO TABS
1000.0000 mg | ORAL_TABLET | Freq: Once | ORAL | Status: AC
  Administered 2018-09-10: 1000 mg via ORAL
  Filled 2018-09-10: qty 2

## 2018-09-10 MED ORDER — METRONIDAZOLE 0.75 % VA GEL: 1.0000 | Freq: Every day | VAGINAL | 0 refills | Status: DC

## 2018-09-10 NOTE — MAU Note (Signed)
Pt dx with a probable UTI on 09/05/18 over the phone with ob provider based on sx's and pres Bactrim. Pt states,"I did not start medication because the UTI dx was made over the phone and I don't want to risk taking a medication I do not need".   Adah Perl RN

## 2018-09-10 NOTE — MAU Note (Signed)
Pt here with c/o neck, back, shoulder pain. Was prescribed pain med from Urgent Care and now having stomach pain. Denies any bleeding or leaking.

## 2018-09-10 NOTE — MAU Provider Note (Signed)
History     CSN: [id number]  Arrival date and time: 09/10/18 0600   First Provider Initiated Contact with Patient 09/10/18 [phone]      Chief Complaint  Patient presents with  . Neck Pain  . Abdominal Pain   Reginia FortsDunia Fitzhenry is a 28 y.o. [id number] at 5179w0d who presents for Neck Pain and Abdominal Pain.  She states her neck, back, and arm pain started about 4 days ago and the abdominal pain started yesterday.  She reports she has been taking "medication and tylenol" which was last taken at 6pm.  Provider review of prescription shows she is taking Flexeril 5mg .  She reports that the pain is constant and describes it as cramping in the lower abdomen and aching in the neck.  She rates all her pain at 9/10. She denies sexual activity in the last 72 hours.  Endorses burning that started about one week ago.  She states the burning only occurs with urination and she was prescribed medication, over the phone, but she has not taken it yet.  She endorses fetal movement.       OB History    Gravida  4   Para  2   Term  2   Preterm      AB  1   Living  2     SAB  1   TAB      Ectopic      Multiple      Live Births  2           Past Medical History:  Diagnosis Date  . GERD (gastroesophageal reflux disease)     Past Surgical History:  Procedure Laterality Date  . DILATION AND CURETTAGE OF UTERUS N/A 08/25/2017   Procedure: SUCTION DILATATION AND CURETTAGE;  Surgeon: Lazaro ArmsEure, Luther H, MD;  Location: [facility];  Service: Gynecology;  Laterality: N/A;    Family History  Problem Relation Age of Onset  . Hypertension Mother   . Diabetes Mother   . Diabetes Father   . Hypertension Paternal Grandfather     Social History   Tobacco Use  . Smoking status: Never Smoker  . Smokeless tobacco: Never Used  Substance Use Topics  . Alcohol use: Never    Alcohol/week: 0.0 standard drinks    Frequency: Never  . Drug use: Never    Allergies: No Known Allergies  Medications Prior to  Admission  Medication Sig Dispense Refill Last Dose  . cyclobenzaprine (FLEXERIL) 5 MG tablet Take 5 mg by mouth 2 (two) times daily as needed for muscle spasms.     Marland Kitchen. acetaminophen (TYLENOL) 500 MG tablet Take 1 tablet (500 mg total) by mouth every 6 (six) hours as needed. 90 tablet 2   . folic acid (FOLVITE) 1 MG tablet Take 1 mg by mouth daily.   Taking  . prenatal vitamin w/FE, FA (PRENATAL 1 + 1) 27-1 MG TABS tablet Take 1 tablet by mouth daily at 12 noon. 30 each 12 Taking  . sulfamethoxazole-trimethoprim (BACTRIM) 400-80 MG tablet Take 1 tablet by mouth 2 (two) times daily. Twice daily for uti 10 tablet 0     Review of Systems  Constitutional: Negative for chills and fever.  Respiratory: Negative for cough and shortness of breath.   Gastrointestinal: Positive for abdominal pain. Negative for constipation, diarrhea, nausea and vomiting.  Genitourinary: Positive for dysuria and vaginal discharge (Started 5 days ago, white, no odor. ). Negative for vaginal bleeding.  Musculoskeletal: Positive for back pain  and neck pain.  Neurological: Negative for dizziness, light-headedness and headaches.   Physical Exam   Blood pressure 101/61, pulse 95, temperature 98.2 F (36.8 C), temperature source Oral, resp. rate 18, weight 54.9 kg, last menstrual period 04/09/2018, SpO2 100 %.  Physical Exam  Constitutional: She is oriented to person, place, and time. She appears well-developed and well-nourished. No distress.  HENT:  Head: Normocephalic and atraumatic.  Eyes: Conjunctivae are normal.  Neck: Normal range of motion.  Cardiovascular: Normal rate, regular rhythm and normal heart sounds.  Respiratory: Effort normal and breath sounds normal. No respiratory distress.  GI: Soft. Bowel sounds are normal. There is abdominal tenderness. There is no guarding.  Genitourinary: There is no tenderness on the right labia. There is no tenderness on the left labia.    Genitourinary Comments: Wet prep  collected via blind swab.  Creamy white discharge noted at introitus.  No apparent odor.     Musculoskeletal: Normal range of motion.        General: No edema.  Neurological: She is alert and oriented to person, place, and time.  Skin: Skin is warm and dry.  Psychiatric: She has a normal mood and affect. Her behavior is normal.    MAU Course  Procedures Results for orders placed or performed during the hospital encounter of 09/10/18 (from the past 24 hour(s))  Urinalysis, Routine w reflex microscopic     Status: Abnormal   Collection Time: 09/10/18  6:47 AM  Result Value Ref Range   Color, Urine YELLOW YELLOW   APPearance HAZY (A) CLEAR   Specific Gravity, Urine 1.025 1.005 - 1.030   pH 5.0 5.0 - 8.0   Glucose, UA NEGATIVE NEGATIVE mg/dL   Hgb urine dipstick NEGATIVE NEGATIVE   Bilirubin Urine NEGATIVE NEGATIVE   Ketones, ur 20 (A) NEGATIVE mg/dL   Protein, ur NEGATIVE NEGATIVE mg/dL   Nitrite NEGATIVE NEGATIVE   Leukocytes,Ua NEGATIVE NEGATIVE  Wet prep, genital     Status: Abnormal   Collection Time: 09/10/18  8:39 AM  Result Value Ref Range   Yeast Wet Prep HPF POC NONE SEEN NONE SEEN   Trich, Wet Prep NONE SEEN NONE SEEN   Clue Cells Wet Prep HPF POC PRESENT (A) NONE SEEN   WBC, Wet Prep HPF POC MANY (A) NONE SEEN   Sperm NONE SEEN     MDM Physical Exam Labs; UA & Wet Prep  Assessment and Plan  28 year old [id number] at 22 weeks Abdominal Pain Back Pain Neck Pain Dysuria  -Exam findings discussed. -Educated on how UTI can cause all the symptoms that she is currently experiencing. -Encouraged to start all her medications as prescribed. -Informed that abdominal cramping most likely related to vaginal/bacterial infection. -Wet Prep Collected -Offered and accepts tylenol 1g for pain. -Interpretations completed by video interpreter Shaymaa.  Follow Up (09:22 AM)  -Wet prep returns significant for clue cells. -Results discussed with patient, via nurse and  interpreter. -Rx for Metrogel sent to pharmacy. -Reports improvement in pain with tylenol dosing.  -Instructed to take all medications as prescribed. -Bleeding precautions to be given. -Encouraged to call or return to MAU if symptoms worsen or with the onset of new symptoms. -Discharged to home in stable condition  Cherre Robins MSN, CNM 09/10/2018, 8:22 AM

## 2018-09-11 ENCOUNTER — Ambulatory Visit (INDEPENDENT_AMBULATORY_CARE_PROVIDER_SITE_OTHER): Payer: Medicaid Other | Admitting: Family Medicine

## 2018-09-11 ENCOUNTER — Other Ambulatory Visit: Payer: Self-pay

## 2018-09-11 ENCOUNTER — Ambulatory Visit: Payer: Medicaid Other | Admitting: Family Medicine

## 2018-09-11 DIAGNOSIS — Z5329 Procedure and treatment not carried out because of patient's decision for other reasons: Secondary | ICD-10-CM

## 2018-09-11 NOTE — Progress Notes (Signed)
Attempted to call pt 3 times with no answer.

## 2018-09-12 ENCOUNTER — Encounter: Payer: Self-pay | Admitting: Family Medicine

## 2018-09-12 ENCOUNTER — Other Ambulatory Visit: Payer: Self-pay

## 2018-09-12 ENCOUNTER — Ambulatory Visit (INDEPENDENT_AMBULATORY_CARE_PROVIDER_SITE_OTHER): Payer: Medicaid Other | Admitting: Family Medicine

## 2018-09-12 DIAGNOSIS — O23599 Infection of other part of genital tract in pregnancy, unspecified trimester: Secondary | ICD-10-CM

## 2018-09-12 DIAGNOSIS — J302 Other seasonal allergic rhinitis: Secondary | ICD-10-CM

## 2018-09-12 DIAGNOSIS — B9689 Other specified bacterial agents as the cause of diseases classified elsewhere: Secondary | ICD-10-CM

## 2018-09-12 NOTE — Progress Notes (Signed)
Virtual Visit via telephone Note  I connected with Catherine Haney on 09/12/18 at 1133 by telephone and verified that I am speaking with the correct person using two identifiers. Catherine Haney is currently located at home and no other people are currently with her during visit. The provider, Elige Radon Avyay Coger, MD is located in their office at time of visit.  Interpreter [phone]  Call ended at 1156  I discussed the limitations, risks, security and privacy concerns of performing an evaluation and management service by telephone and the availability of in person appointments. I also discussed with the patient that there may be a patient responsible charge related to this service. The patient expressed understanding and agreed to proceed.   History and Present Illness: Patient is calling in with pain in neck, back and shoulders that started a few days ago.  She went to urgent care and was given cyclobenzaprine. She was taking that and started having lower abdominal pain and felt like crampy. They gave her an antibiotic and she was concerned about whether or not she could take the antibiotic or not, it was bactrim.  She was told she had vaginal bacteria infection and took cream and resolved symptoms. She did not take antibiotic and took metrogel and feels vaginal and abdominal symptoms have improved.  The cramping and labor like feelings are still present but is improved. Patient is currently [redacted] weeks pregnant. Patient still feels baby moving without issue. She denies any fever or nausea or vomiting.   Has 2 days of sinus pressure and headache.  She is currently not using anything.  She denies any fevers or chills or shortness of breath or wheezing.   Neck pain is better with muscle relaxer  No diagnosis found.  Outpatient Encounter Medications as of 09/12/2018  Medication Sig  . acetaminophen (TYLENOL) 500 MG tablet Take 1 tablet (500 mg total) by mouth every 6 (six) hours as needed.  .  cyclobenzaprine (FLEXERIL) 5 MG tablet Take 5 mg by mouth 2 (two) times daily as needed for muscle spasms.  . folic acid (FOLVITE) 1 MG tablet Take 1 mg by mouth daily.  . metroNIDAZOLE (METROGEL VAGINAL) 0.75 % vaginal gel Place 1 Applicatorful vaginally at bedtime. Insert one applicator, at bedtime, for 5 nights.  . prenatal vitamin w/FE, FA (PRENATAL 1 + 1) 27-1 MG TABS tablet Take 1 tablet by mouth daily at 12 noon.  . sulfamethoxazole-trimethoprim (BACTRIM) 400-80 MG tablet Take 1 tablet by mouth 2 (two) times daily. Twice daily for uti   No facility-administered encounter medications on file as of 09/12/2018.     Review of Systems  Constitutional: Negative for chills and fever.  Eyes: Negative for redness and visual disturbance.  Respiratory: Negative for chest tightness and shortness of breath.   Cardiovascular: Negative for chest pain and leg swelling.  Gastrointestinal: Positive for abdominal pain.  Genitourinary: Positive for dysuria, pelvic pain, vaginal discharge and vaginal pain. Negative for decreased urine volume, difficulty urinating, urgency and vaginal bleeding.  Musculoskeletal: Negative for back pain and gait problem.  Skin: Negative for rash.  Neurological: Negative for light-headedness and headaches.  Psychiatric/Behavioral: Negative for agitation and behavioral problems.  All other systems reviewed and are negative.   Observations/Objective: Patient sounds comfortable and in no acute distress  Assessment and Plan: Problem List Items Addressed This Visit      Respiratory   Other seasonal allergic rhinitis    Other Visit Diagnoses    Bacterial vaginosis in pregnancy    -  Primary      She has metro gel but was concerned about using it because online and says that it is not been during pregnancy all the time.  Reassured her that the MetroGel is safe and to continue to use it, she will not use the Bactrim for now as she did not have any signs of UTI and emergency  department visit but it was the bacterial vaginosis and she will follow-up as needed.  Follow Up Instructions:  As needed   I discussed the assessment and treatment plan with the patient. The patient was provided an opportunity to ask questions and all were answered. The patient agreed with the plan and demonstrated an understanding of the instructions.   The patient was advised to call back or seek an in-person evaluation if the symptoms worsen or if the condition fails to improve as anticipated.  The above assessment and management plan was discussed with the patient. The patient verbalized understanding of and has agreed to the management plan. Patient is aware to call the clinic if symptoms persist or worsen. Patient is aware when to return to the clinic for a follow-up visit. Patient educated on when it is appropriate to go to the emergency department.    I provided 23 minutes of non-face-to-face time during this encounter.    Nils PyleJoshua A Lasasha Brophy, MD

## 2018-09-13 ENCOUNTER — Telehealth: Payer: Self-pay | Admitting: Women's Health

## 2018-09-13 NOTE — Telephone Encounter (Signed)
Chasity a nurse with [employer] states this pt called to be triaged. States the pt mentioned not taking a medication that was prescribed to her but is having issues. She states the medicaition the pt kept spelling does not exist She states the patient is requesting to be seen. Will need Print production planner.

## 2018-09-13 NOTE — Telephone Encounter (Signed)
Spoke to patient via interpreter [phone].  Patient states she read that the Metrogel and Bactim are not safe to take during pregnancy.  She is still experiencing cramping and discomfort in her lower abdomen.  I informed her that BV can cause PTL and encouraged patient to take medication as this is fine to take during pregnancy.  Also made aware that untreated UTI's can cause pyelonephritis which can make her very ill.  She could decide not to take medicine but needed to understand the risks of not taking.  Pt verbalized understanding via interpreter. Also encouraged patient to call our office as opposed to going to PCP for pregnancy related issues.

## 2018-09-18 ENCOUNTER — Ambulatory Visit (INDEPENDENT_AMBULATORY_CARE_PROVIDER_SITE_OTHER): Payer: Medicaid Other | Admitting: Women's Health

## 2018-09-18 ENCOUNTER — Encounter: Payer: Self-pay | Admitting: Women's Health

## 2018-09-18 ENCOUNTER — Other Ambulatory Visit: Payer: Self-pay

## 2018-09-18 VITALS — BP 102/66 | HR 72 | Temp 98.2°F | Wt 121.2 lb

## 2018-09-18 DIAGNOSIS — Z3482 Encounter for supervision of other normal pregnancy, second trimester: Secondary | ICD-10-CM

## 2018-09-18 DIAGNOSIS — Z3A23 23 weeks gestation of pregnancy: Secondary | ICD-10-CM

## 2018-09-18 DIAGNOSIS — Z1389 Encounter for screening for other disorder: Secondary | ICD-10-CM

## 2018-09-18 DIAGNOSIS — Z331 Pregnant state, incidental: Secondary | ICD-10-CM

## 2018-09-18 LAB — POCT URINALYSIS DIPSTICK OB
Blood, UA: NEGATIVE
Glucose, UA: NEGATIVE
Ketones, UA: NEGATIVE
Leukocytes, UA: NEGATIVE
Nitrite, UA: NEGATIVE
POC,PROTEIN,UA: NEGATIVE

## 2018-09-18 NOTE — Progress Notes (Signed)
   LOW-RISK PREGNANCY VISIT Patient name: Catherine Haney MRN [id number]  Date of birth: 28-May-1990 Chief Complaint:   Routine Prenatal Visit (? if have discharge,itching again,can you write RX)  History of Present Illness:   Catherine Haney is a 28 y.o. [id number] female at [redacted]w[redacted]d with an Estimated Date of Delivery: 01/14/19 being seen today for ongoing management of a low-risk pregnancy.  Today she reports no complaints. Sx have completely resolved from BV. Contractions: Irregular.  .  Movement: Present. denies leaking of fluid. Review of Systems:   Pertinent items are noted in HPI Denies abnormal vaginal discharge w/ itching/odor/irritation, headaches, visual changes, shortness of breath, chest pain, abdominal pain, severe nausea/vomiting, or problems with urination or bowel movements unless otherwise stated above. Pertinent History Reviewed:  Reviewed past medical,surgical, social, obstetrical and family history.  Reviewed problem list, medications and allergies. Physical Assessment:   Vitals:   09/18/18 1047  BP: 102/66  Pulse: 72  Temp: 98.2 F (36.8 C)  Weight: 121 lb 3.2 oz (55 kg)  Body mass index is 22.17 kg/m.        Physical Examination:   General appearance: Well appearing, and in no distress  Mental status: Alert, oriented to person, place, and time  Skin: Warm & dry  Cardiovascular: Normal heart rate noted  Respiratory: Normal respiratory effort, no distress  Abdomen: Soft, gravid, nontender  Pelvic: Cervical exam deferred         Extremities: Edema: None  Fetal Status: Fetal Heart Rate (bpm): 152 Fundal Height: 21 cm Movement: Present    Results for orders placed or performed in visit on 09/18/18 (from the past 24 hour(s))  POC Urinalysis Dipstick OB   Collection Time: 09/18/18 10:59 AM  Result Value Ref Range   Color, UA     Clarity, UA     Glucose, UA Negative Negative   Bilirubin, UA     Ketones, UA neg    Spec Grav, UA     Blood, UA neg    pH, UA      POC,PROTEIN,UA Negative Negative, Trace, Small (1+), Moderate (2+), Large (3+), 4+   Urobilinogen, UA     Nitrite, UA neg    Leukocytes, UA Negative Negative   Appearance     Odor      Assessment & Plan:  1) Low-risk pregnancy [id number] at [redacted]w[redacted]d with an Estimated Date of Delivery: 01/14/19    Meds: No orders of the defined types were placed in this encounter.  Labs/procedures today: none  Plan:  Continue routine obstetrical care   Reviewed: Preterm labor symptoms and general obstetric precautions including but not limited to vaginal bleeding, contractions, leaking of fluid and fetal movement were reviewed in detail with the patient.  All questions were answered. Continue checking weekly bp's at home, let us know if >140/90.  Arabic interpreter used  Follow-up: Return in about 4 weeks (around 10/16/2018) for PN2, LROB in person.  Orders Placed This Encounter  Procedures  . POC Urinalysis Dipstick OB   Cheral Marker CNM, [facility] 09/18/2018 11:32 AM

## 2018-09-18 NOTE — Patient Instructions (Signed)
Catherine Haney, I greatly value your feedback.  If you receive a survey following your visit with us today, we appreciate you taking the time to fill it out.  Thanks, Joellyn HaffKim Marquitta Haney, CNM, WHNP-BC   You will have your sugar test next visit.  Please do not eat or drink anything after midnight the night before you come, not even water.  You will be here for at least two hours.     [facility] is now [facility] ([address] [place], [place] [address]) Entrance located off of [address] Free 24/7 valet parking   Call the office [phone]) or go to [facility] if:  You begin to have strong, frequent contractions  Your water breaks.  Sometimes it is a big gush of fluid, sometimes it is just a trickle that keeps getting your panties wet or running down your legs  You have vaginal bleeding.  It is normal to have a small amount of spotting if your cervix was checked.   You don't feel your baby moving like normal.  If you don't, get you something to eat and drink and lay down and focus on feeling your baby move.   If your baby is still not moving like normal, you should call the office or go to [facility].  Second Trimester of Pregnancy The second trimester is from week 13 through week 28, months 4 through 6. The second trimester is often a time when you feel your best. Your body has also adjusted to being pregnant, and you begin to feel better physically. Usually, morning sickness has lessened or quit completely, you may have more energy, and you may have an increase in appetite. The second trimester is also a time when the fetus is growing rapidly. At the end of the sixth month, the fetus is about 9 inches long and weighs about 1 pounds. You will likely begin to feel the baby move (quickening) between 18 and 20 weeks of the pregnancy. BODY CHANGES Your body goes through many changes during pregnancy. The changes vary from woman to woman.    Your weight will continue to increase. You will notice your lower abdomen bulging out.  You may begin to get stretch marks on your hips, abdomen, and breasts.  You may develop headaches that can be relieved by medicines approved by your health care provider.  You may urinate more often because the fetus is pressing on your bladder.  You may develop or continue to have heartburn as a result of your pregnancy.  You may develop constipation because certain hormones are causing the muscles that push waste through your intestines to slow down.  You may develop hemorrhoids or swollen, bulging veins (varicose veins).  You may have back pain because of the weight gain and pregnancy hormones relaxing your joints between the bones in your pelvis and as a result of a shift in weight and the muscles that support your balance.  Your breasts will continue to grow and be tender.  Your gums may bleed and may be sensitive to brushing and flossing.  Dark spots or blotches (chloasma, mask of pregnancy) may develop on your face. This will likely fade after the baby is born.  A dark line from your belly button to the pubic area (linea nigra) may appear. This will likely fade after the baby is born.  You may have changes in your hair. These can include thickening of your hair,  rapid growth, and changes in texture. Some women also have hair loss during or after pregnancy, or hair that feels dry or thin. Your hair will most likely return to normal after your baby is born. WHAT TO EXPECT AT YOUR PRENATAL VISITS During a routine prenatal visit:  You will be weighed to make sure you and the fetus are growing normally.  Your blood pressure will be taken.  Your abdomen will be measured to track your baby's growth.  The fetal heartbeat will be listened to.  Any test results from the previous visit will be discussed. Your health care provider may ask you:  How you are feeling.  If you are feeling the  baby move.  If you have had any abnormal symptoms, such as leaking fluid, bleeding, severe headaches, or abdominal cramping.  If you have any questions. Other tests that may be performed during your second trimester include:  Blood tests that check for:  Low iron levels (anemia).  Gestational diabetes (between 24 and 28 weeks).  Rh antibodies.  Urine tests to check for infections, diabetes, or protein in the urine.  An ultrasound to confirm the proper growth and development of the baby.  An amniocentesis to check for possible genetic problems.  Fetal screens for spina bifida and Down syndrome. HOME CARE INSTRUCTIONS   Avoid all smoking, herbs, alcohol, and unprescribed drugs. These chemicals affect the formation and growth of the baby.  Follow your health care provider's instructions regarding medicine use. There are medicines that are either safe or unsafe to take during pregnancy.  Exercise only as directed by your health care provider. Experiencing uterine cramps is a good sign to stop exercising.  Continue to eat regular, healthy meals.  Wear a good support bra for breast tenderness.  Do not use hot tubs, steam rooms, or saunas.  Wear your seat belt at all times when driving.  Avoid raw meat, uncooked cheese, cat litter boxes, and soil used by cats. These carry germs that can cause birth defects in the baby.  Take your prenatal vitamins.  Try taking a stool softener (if your health care provider approves) if you develop constipation. Eat more high-fiber foods, such as fresh vegetables or fruit and whole grains. Drink plenty of fluids to keep your urine clear or pale yellow.  Take warm sitz baths to soothe any pain or discomfort caused by hemorrhoids. Use hemorrhoid cream if your health care provider approves.  If you develop varicose veins, wear support hose. Elevate your feet for 15 minutes, 3-4 times a day. Limit salt in your diet.  Avoid heavy lifting, wear low  heel shoes, and practice good posture.  Rest with your legs elevated if you have leg cramps or low back pain.  Visit your dentist if you have not gone yet during your pregnancy. Use a soft toothbrush to brush your teeth and be gentle when you floss.  A sexual relationship may be continued unless your health care provider directs you otherwise.  Continue to go to all your prenatal visits as directed by your health care provider. SEEK MEDICAL CARE IF:   You have dizziness.  You have mild pelvic cramps, pelvic pressure, or nagging pain in the abdominal area.  You have persistent nausea, vomiting, or diarrhea.  You have a bad smelling vaginal discharge.  You have pain with urination. SEEK IMMEDIATE MEDICAL CARE IF:   You have a fever.  You are leaking fluid from your vagina.  You have spotting or bleeding from  your vagina.  You have severe abdominal cramping or pain.  You have rapid weight gain or loss.  You have shortness of breath with chest pain.  You notice sudden or extreme swelling of your face, hands, ankles, feet, or legs.  You have not felt your baby move in over an hour.  You have severe headaches that do not go away with medicine.  You have vision changes. Document Released: 05/03/2001 Document Revised: 05/14/2013 Document Reviewed: 07/10/2012 [facility] Patient Information 2015 [place], [place]. This information is not intended to replace advice given to you by your health care provider. Make sure you discuss any questions you have with your health care provider.

## 2018-10-03 ENCOUNTER — Telehealth: Payer: Self-pay | Admitting: *Deleted

## 2018-10-03 MED ORDER — CIMETIDINE 200 MG PO TABS: 200.0000 mg | ORAL_TABLET | Freq: Two times a day (BID) | ORAL | 2 refills | Status: DC

## 2018-10-03 NOTE — Telephone Encounter (Signed)
I have sent cimetidine for her which as far as I can tell is a class B which means that it should be safe for pregnancy.

## 2018-10-03 NOTE — Telephone Encounter (Signed)
Interpreter left details of new medication on voice mail at patient's number.

## 2018-10-03 NOTE — Telephone Encounter (Signed)
Fax from [facility] Request alternative Rx Ranitidine recalled Please send in alternative

## 2018-10-16 ENCOUNTER — Encounter: Payer: Self-pay | Admitting: *Deleted

## 2018-10-17 ENCOUNTER — Ambulatory Visit (INDEPENDENT_AMBULATORY_CARE_PROVIDER_SITE_OTHER): Payer: Medicaid Other | Admitting: Obstetrics and Gynecology

## 2018-10-17 ENCOUNTER — Other Ambulatory Visit: Payer: Medicaid Other

## 2018-10-17 ENCOUNTER — Encounter: Payer: Self-pay | Admitting: Obstetrics and Gynecology

## 2018-10-17 ENCOUNTER — Other Ambulatory Visit: Payer: Self-pay

## 2018-10-17 VITALS — BP 101/64 | HR 101 | Temp 98.1°F | Wt 121.8 lb

## 2018-10-17 DIAGNOSIS — Z331 Pregnant state, incidental: Secondary | ICD-10-CM

## 2018-10-17 DIAGNOSIS — Z3482 Encounter for supervision of other normal pregnancy, second trimester: Secondary | ICD-10-CM

## 2018-10-17 DIAGNOSIS — Z1389 Encounter for screening for other disorder: Secondary | ICD-10-CM

## 2018-10-17 DIAGNOSIS — Z3A27 27 weeks gestation of pregnancy: Secondary | ICD-10-CM

## 2018-10-17 LAB — POCT URINALYSIS DIPSTICK OB
Blood, UA: NEGATIVE
Glucose, UA: NEGATIVE
Ketones, UA: NEGATIVE
Leukocytes, UA: NEGATIVE
Nitrite, UA: NEGATIVE

## 2018-10-17 NOTE — Progress Notes (Addendum)
Patient ID: Catherine Haney, female   DOB: 1991/04/22, 28 y.o.   MRN: [id number]    LOW-RISK PREGNANCY VISIT Patient name: Catherine Haney MRN [id number]  Date of birth: Sep 22, 1990 Chief Complaint:   Routine Prenatal Visit (PN2)  History of Present Illness:   Catherine Haney is a 28 y.o. [phone] female at [redacted]w[redacted]d with an Estimated Date of Delivery: 01/14/19 being seen today for ongoing management of a low-risk pregnancy. She is accompanied by a Nurse, learning disability. She is having heartburn, was told not to take zantac and is unsure why. She doesn't take the cimetidine in medication list. She has been congested, allergies, and blood comes out of her nose, due to sinus congestion. It has happened 3 times in th past month.  Today she reports heartburn and vomiting with nose bleeds. Contractions: Not present.  .  Movement: Present. denies leaking of fluid. Review of Systems:   Pertinent items are noted in HPI Denies abnormal vaginal discharge w/ itching/odor/irritation, headaches, visual changes, shortness of breath, chest pain, abdominal pain, severe nausea/vomiting, or problems with urination or bowel movements unless otherwise stated above. Pertinent History Reviewed:  Reviewed past medical,surgical, social, obstetrical and family history.  Reviewed problem list, medications and allergies. Physical Assessment:   Vitals:   10/17/18 1008  BP: 101/64  Pulse: (!) 101  Temp: 98.1 F (36.7 C)  Weight: 121 lb 12.8 oz (55.2 kg)  Body mass index is 22.28 kg/m.        Physical Examination:   General appearance: Well appearing, and in no distress  Mental status: Alert, oriented to person, place, and time  Skin: Warm & dry  Cardiovascular: Normal heart rate noted  Respiratory: Normal respiratory effort, no distress  Abdomen: Soft, gravid, nontender  Pelvic: Cervical exam deferred         Extremities: Edema: None  Fetal Status: Fetal Heart Rate (bpm): 158 Fundal Height: 26 cm Movement: Present    Results for  orders placed or performed in visit on 10/17/18 (from the past 24 hour(s))  POC Urinalysis Dipstick OB   Collection Time: 10/17/18 10:22 AM  Result Value Ref Range   Color, UA     Clarity, UA     Glucose, UA Negative Negative   Bilirubin, UA     Ketones, UA neg    Spec Grav, UA     Blood, UA neg    pH, UA     POC,PROTEIN,UA Trace Negative, Trace, Small (1+), Moderate (2+), Large (3+), 4+   Urobilinogen, UA     Nitrite, UA neg    Leukocytes, UA Negative Negative   Appearance     Odor      Assessment & Plan:  1) Low-risk pregnancy [id number] at [redacted]w[redacted]d with an Estimated Date of Delivery: 01/14/19   2) Seasonal allergies, recommendation of humidifier 3. GERD to start Cimetidine as Rx'd earlier 4 size slightly <dates, u/s for growth 2 wk.  Meds: No orders of the defined types were placed in this encounter.  Labs/procedures today: PN2  Plan:   1. Continue routine obstetrical care  2. F/u in 2  w/ [place]  Reviewed: Preterm labor symptoms and general obstetric precautions including but not limited to vaginal bleeding, contractions, leaking of fluid and fetal movement were reviewed in detail with the patient.  All questions were answered  Follow-up: Return in about 2 weeks (around 10/31/2018). for Growth u/s  Orders Placed This Encounter  Procedures  . POC Urinalysis Dipstick OB   By signing my name below, I,  Arnette NorrisMari Johnson, attest that this documentation has been prepared under the direction and in the presence of Tilda BurrowFerguson, Jaire Pinkham V, MD. Electronically Signed: Arnette NorrisMari Johnson Medical Scribe. 10/17/18. 10:37 AM.  I personally performed the services described in this documentation, which was SCRIBED in my presence. The recorded information has been reviewed and considered accurate. It has been edited as necessary during review. Tilda BurrowJohn V Andrian Urbach, MD

## 2018-10-18 ENCOUNTER — Encounter: Payer: Self-pay | Admitting: Obstetrics and Gynecology

## 2018-10-18 ENCOUNTER — Telehealth: Payer: Self-pay | Admitting: *Deleted

## 2018-10-18 DIAGNOSIS — O99019 Anemia complicating pregnancy, unspecified trimester: Secondary | ICD-10-CM | POA: Insufficient documentation

## 2018-10-18 HISTORY — DX: Anemia complicating pregnancy, unspecified trimester: O99.019

## 2018-10-18 LAB — CBC
Hematocrit: 27.7 % — ABNORMAL LOW (ref 34.0–46.6)
Hemoglobin: 9.5 g/dL — ABNORMAL LOW (ref 11.1–15.9)
MCH: 30 pg (ref 26.6–33.0)
MCHC: 34.3 g/dL (ref 31.5–35.7)
MCV: 87 fL (ref 79–97)
Platelets: 311 x10E3/uL (ref 150–450)
RBC: 3.17 x10E6/uL — ABNORMAL LOW (ref 3.77–5.28)
RDW: 12.3 % (ref 11.7–15.4)
WBC: 4.7 x10E3/uL (ref 3.4–10.8)

## 2018-10-18 LAB — GLUCOSE TOLERANCE, 2 HOURS W/ 1HR
Glucose, 1 hour: 142 mg/dL (ref 65–179)
Glucose, 2 hour: 108 mg/dL (ref 65–152)
Glucose, Fasting: 71 mg/dL (ref 65–91)

## 2018-10-18 LAB — SYPHILIS: RPR W/REFLEX TO RPR TITER AND TREPONEMAL ANTIBODIES, TRADITIONAL SCREENING AND DIAGNOSIS ALGORITHM: RPR Ser Ql: NONREACTIVE

## 2018-10-18 LAB — ANTIBODY SCREEN: Antibody Screen: NEGATIVE

## 2018-10-18 LAB — HIV ANTIBODY (ROUTINE TESTING W REFLEX): HIV Screen 4th Generation wRfx: NONREACTIVE

## 2018-10-18 NOTE — Telephone Encounter (Signed)
-----   Message from Tilda Burrow, MD sent at 10/18/2018 10:58 AM EDT ----- Hemoglobin has declined; Catherine Haney would benefit from taking Iron Supplement. Simple FeSO4 tablets , plus a stool softener to prevent constipation, would be of benefit.

## 2018-10-18 NOTE — Telephone Encounter (Signed)
Results given through interpreter. Pt voiced understanding. JSY

## 2018-10-19 ENCOUNTER — Telehealth: Payer: Self-pay | Admitting: Obstetrics and Gynecology

## 2018-10-19 NOTE — Telephone Encounter (Signed)
Patient called, requesting to speak to someone that speak Arabic   [phone]

## 2018-10-22 ENCOUNTER — Telehealth: Payer: Self-pay | Admitting: Women's Health

## 2018-10-22 ENCOUNTER — Telehealth: Payer: Self-pay | Admitting: *Deleted

## 2018-10-22 NOTE — Telephone Encounter (Signed)
Call made using [facility].  Patient states she went to pharmacy to get iron supplements but they were out of the kind we suggested.  Pharmacist suggested another one that was comparable.  Advised that was fine to take.

## 2018-10-22 NOTE — Telephone Encounter (Signed)
Patient called stating that she has two questions for the nurse, pt also states if we could please call her with an arabic interpreter. Please contact pt

## 2018-10-24 ENCOUNTER — Other Ambulatory Visit: Payer: Self-pay | Admitting: Adult Health

## 2018-10-24 MED ORDER — PREPLUS 27-1 MG PO TABS: 1.0000 | ORAL_TABLET | Freq: Every day | ORAL | 12 refills | Status: DC

## 2018-10-24 NOTE — Progress Notes (Signed)
rx PNV 

## 2018-10-30 ENCOUNTER — Other Ambulatory Visit: Payer: Self-pay | Admitting: Obstetrics and Gynecology

## 2018-10-30 ENCOUNTER — Other Ambulatory Visit: Payer: Self-pay

## 2018-10-30 ENCOUNTER — Encounter (HOSPITAL_COMMUNITY): Payer: Self-pay

## 2018-10-30 ENCOUNTER — Emergency Department (HOSPITAL_COMMUNITY)
Admission: EM | Admit: 2018-10-30 | Discharge: 2018-10-30 | Disposition: A | Discharge status: Home / Self Care | Payer: Medicaid Other | Attending: Emergency Medicine | Admitting: Emergency Medicine

## 2018-10-30 DIAGNOSIS — O26842 Uterine size-date discrepancy, second trimester: Secondary | ICD-10-CM

## 2018-10-30 DIAGNOSIS — Z79899 Other long term (current) drug therapy: Secondary | ICD-10-CM | POA: Insufficient documentation

## 2018-10-30 DIAGNOSIS — R109 Unspecified abdominal pain: Secondary | ICD-10-CM

## 2018-10-30 DIAGNOSIS — O26899 Other specified pregnancy related conditions, unspecified trimester: Secondary | ICD-10-CM | POA: Insufficient documentation

## 2018-10-30 DIAGNOSIS — O26892 Other specified pregnancy related conditions, second trimester: Secondary | ICD-10-CM

## 2018-10-30 DIAGNOSIS — R1030 Lower abdominal pain, unspecified: Secondary | ICD-10-CM | POA: Diagnosis not present

## 2018-10-30 DIAGNOSIS — Z3A29 29 weeks gestation of pregnancy: Secondary | ICD-10-CM | POA: Insufficient documentation

## 2018-10-30 LAB — CBC WITH DIFFERENTIAL/PLATELET
Abs Immature Granulocytes: 0.08 10*3/uL — ABNORMAL HIGH (ref 0.00–0.07)
Basophils Absolute: 0 10*3/uL (ref 0.0–0.1)
Basophils Relative: 0 %
Eosinophils Absolute: 0.1 10*3/uL (ref 0.0–0.5)
Eosinophils Relative: 1 %
HCT: 28.4 % — ABNORMAL LOW (ref 36.0–46.0)
Hemoglobin: 9.3 g/dL — ABNORMAL LOW (ref 12.0–15.0)
Immature Granulocytes: 1 %
Lymphocytes Relative: 24 %
Lymphs Abs: 2.2 10*3/uL (ref 0.7–4.0)
MCH: 30 pg (ref 26.0–34.0)
MCHC: 32.7 g/dL (ref 30.0–36.0)
MCV: 91.6 fL (ref 80.0–100.0)
Monocytes Absolute: 0.4 10*3/uL (ref 0.1–1.0)
Monocytes Relative: 5 %
Neutro Abs: 6.3 10*3/uL (ref 1.7–7.7)
Neutrophils Relative %: 69 %
Platelets: 252 10*3/uL (ref 150–400)
RBC: 3.1 MIL/uL — ABNORMAL LOW (ref 3.87–5.11)
RDW: 13.2 % (ref 11.5–15.5)
WBC: 9.1 10*3/uL (ref 4.0–10.5)
nRBC: 0 % (ref 0.0–0.2)

## 2018-10-30 LAB — BASIC METABOLIC PANEL
Anion gap: 10 (ref 5–15)
BUN: 9 mg/dL (ref 6–20)
CO2: 21 mmol/L — ABNORMAL LOW (ref 22–32)
Calcium: 8.5 mg/dL — ABNORMAL LOW (ref 8.9–10.3)
Chloride: 106 mmol/L (ref 98–111)
Creatinine, Ser: 0.32 mg/dL — ABNORMAL LOW (ref 0.44–1.00)
GFR calc Af Amer: >60 mL/min (ref >60)
GFR calc non Af Amer: >60 mL/min (ref >60)
Glucose, Bld: 94 mg/dL (ref 70–99)
Potassium: 3.8 mmol/L (ref 3.5–5.1)
Sodium: 137 mmol/L (ref 135–145)

## 2018-10-30 LAB — URINALYSIS, ROUTINE W REFLEX MICROSCOPIC
Bilirubin Urine: NEGATIVE
Glucose, UA: NEGATIVE mg/dL
Hgb urine dipstick: NEGATIVE
Ketones, ur: NEGATIVE mg/dL
Leukocytes,Ua: NEGATIVE
Nitrite: NEGATIVE
Protein, ur: NEGATIVE mg/dL
Specific Gravity, Urine: 1.008 (ref 1.005–1.030)
pH: 6 (ref 5.0–8.0)

## 2018-10-30 MED ORDER — SODIUM CHLORIDE 0.9 % IV BOLUS
1000.0000 mL | Freq: Once | INTRAVENOUS | Status: AC
  Administered 2018-10-30: 1000 mL via INTRAVENOUS

## 2018-10-30 MED ORDER — HYDROCODONE-ACETAMINOPHEN 5-325 MG PO TABS
1.0000 | ORAL_TABLET | Freq: Once | ORAL | Status: AC
  Administered 2018-10-30: 1 via ORAL
  Filled 2018-10-30: qty 1

## 2018-10-30 NOTE — Discharge Instructions (Addendum)
Take your usual prescriptions as previously directed.  Call your regular OB/GYN doctor today to schedule a follow up appointment within the next 2 days.  Go to [facility] immediately sooner if worsening.

## 2018-10-30 NOTE — ED Notes (Signed)
FHR: 145-152bpm

## 2018-10-30 NOTE — ED Provider Notes (Addendum)
[facility] EMERGENCY DEPARTMENT Provider Note   CSN: [id number] Arrival date & time: 10/30/18  0518    History   Chief Complaint Chief Complaint  Patient presents with  . Abdominal Pain    [redacted] weeks pregnant    HPI Catherine Haney is a 28 y.o. female.     HPI  This is a 28 year old 924 P2-0-1-2 female approximately 1329 weeks pregnant with an estimated delivery date of August 24 who presents with abdominal pain.  Patient reports 2-day history of lower abdominal pain.  She describes it as crampy and "like my.."  She also reports that it feels somewhat like contractions.  It does come and go.  She also reports lower back pain.  She denies any vaginal bleeding or loss of fluids.  She denies any dysuria or hematuria.  No nausea or vomiting.  Currently she rates her pain at 8 out of 10.  She has taken Tylenol with no relief.  She reports good fetal movement.  Chart reviewed.  Anatomy scan showed anterior placenta without previa.  Considered a low risk pregnancy.  No history of premature labor.  History was taken with the aid of an interpreter.  Past Medical History:  Diagnosis Date  . GERD (gastroesophageal reflux disease)     Patient Active Problem List   Diagnosis Date Noted  . Anemia affecting third pregnancy 10/18/2018  . Supervision of normal pregnancy 07/10/2018  . Atypical chest pain 10/17/2017  . Vitamin D deficiency 08/27/2015  . Hyperlipidemia 08/27/2015  . GERD (gastroesophageal reflux disease) 08/20/2015  . Generalized anxiety disorder 07/24/2015  . Other seasonal allergic rhinitis 03/18/2015  . Chronic pain of multiple joints 03/18/2015    Past Surgical History:  Procedure Laterality Date  . DILATION AND CURETTAGE OF UTERUS N/A 08/25/2017   Procedure: SUCTION DILATATION AND CURETTAGE;  Surgeon: Lazaro ArmsEure, Luther H, MD;  Location: [facility];  Service: Gynecology;  Laterality: N/A;     OB History    Gravida  4   Para  2   Term  2   Preterm      AB  1   Living  2      SAB  1   TAB      Ectopic      Multiple      Live Births  2            Home Medications    Prior to Admission medications   Medication Sig Start Date End Date Taking? Authorizing Provider  acetaminophen (TYLENOL) 500 MG tablet Take 1 tablet (500 mg total) by mouth every 6 (six) hours as needed. Patient not taking: Reported on 10/17/2018 08/24/18   Dettinger, Elige RadonJoshua A, MD  cimetidine (CIMETIDINE 200) 200 MG tablet Take 1 tablet (200 mg total) by mouth 2 (two) times daily. Patient not taking: Reported on 10/17/2018 10/03/18   Dettinger, Elige RadonJoshua A, MD  cyclobenzaprine (FLEXERIL) 5 MG tablet Take 5 mg by mouth 2 (two) times daily as needed for muscle spasms.    [provider]  folic acid (FOLVITE) 1 MG tablet Take 1 mg by mouth daily.    [provider]  metroNIDAZOLE (METROGEL VAGINAL) 0.75 % vaginal gel Place 1 Applicatorful vaginally at bedtime. Insert one applicator, at bedtime, for 5 nights. Patient not taking: Reported on 10/17/2018 09/10/18   Gerrit HeckEmly, Jessica, CNM  Prenatal Vit-Fe Fumarate-FA (PREPLUS) 27-1 MG TABS Take 1 tablet by mouth daily. 10/24/18   Adline PotterGriffin, Jennifer A, NP    Family History  Family History  Problem Relation Age of Onset  . Hypertension Mother   . Diabetes Mother   . Diabetes Father   . Hypertension Paternal Grandfather     Social History Social History   Tobacco Use  . Smoking status: Never Smoker  . Smokeless tobacco: Never Used  Substance Use Topics  . Alcohol use: Never    Alcohol/week: 0.0 standard drinks    Frequency: Never  . Drug use: Never     Allergies   Patient has no known allergies.   Review of Systems Review of Systems  Constitutional: Negative for fever.  Respiratory: Negative for cough and shortness of breath.   Cardiovascular: Negative for chest pain.  Gastrointestinal: Positive for abdominal pain.  Genitourinary: Negative for dysuria, vaginal bleeding and vaginal discharge.  Musculoskeletal:  Positive for back pain.  All other systems reviewed and are negative.    Physical Exam Updated Vital Signs BP 94/63 (BP Location: Right Arm)   Pulse 72   Temp 98.6 F (37 C) (Oral)   Resp 14   Wt 55.2 kg Comment: taken from EMR one week ago  LMP 04/09/2018   SpO2 99%   BMI 22.28 kg/m   Physical Exam Vitals signs and nursing note reviewed.  Constitutional:      Appearance: She is well-developed.  HENT:     Head: Normocephalic and atraumatic.  Neck:     Musculoskeletal: Neck supple.  Cardiovascular:     Rate and Rhythm: Normal rate and regular rhythm.     Heart sounds: Normal heart sounds.  Pulmonary:     Effort: Pulmonary effort is normal. No respiratory distress.     Breath sounds: No wheezing.  Abdominal:     General: Bowel sounds are normal.     Palpations: Abdomen is soft.     Tenderness: There is abdominal tenderness.     Comments: Gravid abdomen above the umbilicus, tenderness to palpation over the left lower quadrant, no rebound or guarding  Genitourinary:    Comments: Cervix:  Finger tip/0%/high Skin:    General: Skin is warm and dry.  Neurological:     Mental Status: She is alert and oriented to person, place, and time.  Psychiatric:        Mood and Affect: Mood normal.      ED Treatments / Results  Labs (all labs ordered are listed, but only abnormal results are displayed) Labs Reviewed  CBC WITH DIFFERENTIAL/PLATELET - Abnormal; Notable for the following components:      Result Value   RBC 3.10 (*)    Hemoglobin 9.3 (*)    HCT 28.4 (*)    Abs Immature Granulocytes 0.08 (*)    All other components within normal limits  BASIC METABOLIC PANEL - Abnormal; Notable for the following components:   CO2 21 (*)    Creatinine, Ser 0.32 (*)    Calcium 8.5 (*)    All other components within normal limits  URINALYSIS, ROUTINE W REFLEX MICROSCOPIC    EKG None  Radiology No results found.  Procedures Procedures (including critical care time)   Medications Ordered in ED Medications  sodium chloride 0.9 % bolus 1,000 mL (0 mLs Intravenous Stopped 10/30/18 0746)  HYDROcodone-acetaminophen (NORCO/VICODIN) 5-325 MG per tablet 1 tablet (1 tablet Oral Given 10/30/18 0654)     Initial Impression / Assessment and Plan / ED Course  I have reviewed the triage vital signs and the nursing notes.  Pertinent labs & imaging results that were available during my  care of the patient were reviewed by me and considered in my medical decision making (see chart for details).  Clinical Course as of Oct 29 2252  Tue Oct 30, 2018  0654 Patient being monitored.  [facility], has had 1 contraction but no consistent contractions.  We will continue to monitor.   [CH]    Clinical Course User Index [CH] Eulonda Andalon, Mayer Maskerourtney F, MD       Patient presents with abdominal pain during pregnancy.  She is overall nontoxic-appearing.  Denies loss of fluids.  Considerations include false labor, active labor, urinary tract infection.  Given location of pain less likely appendicitis or cholecystitis.  Lab work obtained.  Patient was given fluids and pain medication.  She was placed on monitor.  Cervical check reassuring.  Disposition pending monitoring and urinalysis.  Final Clinical Impressions(s) / ED Diagnoses   Final diagnoses:  Abdominal pain during pregnancy in second trimester    ED Discharge Orders    None       Lusine Corlett, Mayer Maskerourtney F, MD 10/30/18 [phone]    Shon BatonHorton, Alter Moss F, MD 10/30/18 [phone]

## 2018-10-30 NOTE — ED Notes (Addendum)
Spoke with Erin with OB rapid response. . Advised pt can come off monitor. [facility]. [facility] If any pregnancy related problems. Dr Thurnell Garbe notified

## 2018-10-30 NOTE — ED Triage Notes (Signed)
Pt reports abdominal pain that started yesterday evening. Pt denies vaginal discharge or bleeding. Pt last prenatal visit was 1-2 weeks ago and everything was okay per pt report.

## 2018-10-30 NOTE — Progress Notes (Signed)
0730 Spoke with Dr. Rosana Hoes of [facility].  Informed of Category I FHR with some UI noted.  She advised that EDP reports SVE of FT.  She requests OBRR RN communicate with ED RN that EFM can be removed and pt can be discharged home to f/u with [facility] as scheduled on Thursday.  0737 ED RN Shirlean Mylar notified of above and asked to give instructions to pt for f/u and to advise pt to come to [facility] or call OB MD for future OB concerns.

## 2018-10-30 NOTE — ED Notes (Signed)
Rapid response nurse notified of pt on monitor.

## 2018-10-30 NOTE — ED Provider Notes (Signed)
Pt received at sign out with Udip and d/w OB staff pending. Please see previous EDP note for full HPI/H&P/MDM.  Udip without clear infection, UC pending. Pt currently on FHM/Toco. Unclear to ED RN if pt/baby OK and can d/c monitor after talking with OB RN. 3953:  I called OB MD Rosana Hoes to clarify: case discussed, including:  HPI, pertinent PM/SHx, VS/PE, dx testing, ED course and treatment: she states she will review strips with OB Rapid Response RN and call ED RN with further recommendations. 25:  OB RN has spoken with Charge RN: pt is OK to take of monitor, can be d/c and f/u [facility] Thursday, advise pt to [facility] to [facility] with any pregnancy related issues. Dx and testing, as well as d/w OB staff, d/w pt.  Questions answered.  Verb understanding, agreeable to d/c home with outpt f/u.    Francine Graven, DO 10/30/18 [phone]

## 2018-11-01 ENCOUNTER — Ambulatory Visit (INDEPENDENT_AMBULATORY_CARE_PROVIDER_SITE_OTHER): Payer: Medicaid Other | Admitting: Women's Health

## 2018-11-01 ENCOUNTER — Other Ambulatory Visit: Payer: Self-pay

## 2018-11-01 ENCOUNTER — Ambulatory Visit (INDEPENDENT_AMBULATORY_CARE_PROVIDER_SITE_OTHER): Payer: Medicaid Other

## 2018-11-01 ENCOUNTER — Encounter: Payer: Self-pay | Admitting: Women's Health

## 2018-11-01 VITALS — BP 104/67 | HR 83 | Wt 124.0 lb

## 2018-11-01 DIAGNOSIS — O26842 Uterine size-date discrepancy, second trimester: Secondary | ICD-10-CM

## 2018-11-01 DIAGNOSIS — Z1389 Encounter for screening for other disorder: Secondary | ICD-10-CM

## 2018-11-01 DIAGNOSIS — Z3A29 29 weeks gestation of pregnancy: Secondary | ICD-10-CM | POA: Diagnosis not present

## 2018-11-01 DIAGNOSIS — Z3482 Encounter for supervision of other normal pregnancy, second trimester: Secondary | ICD-10-CM

## 2018-11-01 DIAGNOSIS — Z331 Pregnant state, incidental: Secondary | ICD-10-CM

## 2018-11-01 LAB — POCT URINALYSIS DIPSTICK OB
Blood, UA: NEGATIVE
Glucose, UA: NEGATIVE
Ketones, UA: NEGATIVE
Leukocytes, UA: NEGATIVE
Nitrite, UA: NEGATIVE
POC,PROTEIN,UA: NEGATIVE

## 2018-11-01 MED ORDER — FERROUS SULFATE 325 (65 FE) MG PO TABS: 325.0000 mg | ORAL_TABLET | Freq: Two times a day (BID) | ORAL | 3 refills | Status: DC

## 2018-11-01 NOTE — Progress Notes (Signed)
LOW-RISK PREGNANCY VISIT Patient name: Catherine Haney MRN [id number]  Date of birth: Jul 01, 1990 Chief Complaint:   Routine Prenatal Visit (U/S)  History of Present Illness:   Catherine Haney is a 28 y.o. [id number] female at [redacted]w[redacted]d with an Estimated Date of Delivery: 01/14/19 being seen today for ongoing management of a low-risk pregnancy.  Today she reports irregular uc's, went to [facility] 6/9, cx FT/thick. Taking otc Fe daily, Hgb 6/9 was still 9, increase to BID. Contractions: Irregular.  .  Movement: Present. denies leaking of fluid. Review of Systems:   Pertinent items are noted in HPI Denies abnormal vaginal discharge w/ itching/odor/irritation, headaches, visual changes, shortness of breath, chest pain, abdominal pain, severe nausea/vomiting, or problems with urination or bowel movements unless otherwise stated above. Pertinent History Reviewed:  Reviewed past medical,surgical, social, obstetrical and family history.  Reviewed problem list, medications and allergies. Physical Assessment:   Vitals:   11/01/18 1200  BP: 104/67  Pulse: 83  Weight: 124 lb (56.2 kg)  Body mass index is 22.68 kg/m.        Physical Examination:   General appearance: Well appearing, and in no distress  Mental status: Alert, oriented to person, place, and time  Skin: Warm & dry  Cardiovascular: Normal heart rate noted  Respiratory: Normal respiratory effort, no distress  Abdomen: Soft, gravid, nontender  Pelvic: Cervical exam deferred         Extremities: Edema: Trace  Fetal Status: Fetal Heart Rate (bpm): 150 u/s   Movement: Present     TODAY'S EFW [place] FOR S<D 59+1 wks,cephalic,cx 2.9 cm,anterior placenta gr 3,normal ovaries bilat,afi 12 cm,fhr 150 bpm,EFW 1364 g 32%  Results for orders placed or performed in visit on 11/01/18 (from the past 24 hour(s))  POC Urinalysis Dipstick OB   Collection Time: 11/01/18 11:59 AM  Result Value Ref Range   Color, UA     Clarity, UA     Glucose, UA Negative Negative    Bilirubin, UA     Ketones, UA neg    Spec Grav, UA     Blood, UA neg    pH, UA     POC,PROTEIN,UA Negative Negative, Trace, Small (1+), Moderate (2+), Large (3+), 4+   Urobilinogen, UA     Nitrite, UA neg    Leukocytes, UA Negative Negative   Appearance     Odor      Assessment & Plan:  1) Low-risk pregnancy [id number] at [redacted]w[redacted]d with an Estimated Date of Delivery: 01/14/19   2) Normal EFW, done for s<d  3) Irregular uc's> increase po fluid, reviewed ptl s/s, reasons to seek care  4) Anemia> increase fe to BID   Meds:  Meds ordered this encounter  Medications  . DISCONTD: ferrous sulfate 325 (65 FE) MG tablet    Sig: Take 1 tablet (325 mg total) by mouth 2 (two) times daily with a meal.    Dispense:  60 tablet    Refill:  3    Order Specific Question:   Supervising Provider    Answer:   Florian Buff [[id number]]   Labs/procedures today: u/s  Plan:  Continue routine obstetrical care   Reviewed: Preterm labor symptoms and general obstetric precautions including but not limited to vaginal bleeding, contractions, leaking of fluid and fetal movement were reviewed in detail with the patient.  All questions were answered  Follow-up: Return in about 3 weeks (around 11/22/2018) for LROB in person.  Orders Placed This Encounter  Procedures  .  POC Urinalysis Dipstick OB   Cheral MarkerKimberly R Christoher Drudge CNM, [facility]NP-BC 11/01/2018 12:27 PM

## 2018-11-01 NOTE — Progress Notes (Signed)
[place] 56+7 wks,cephalic,cx 2.9 cm,anterior placenta gr 3,normal ovaries bilat,afi 12 cm,fhr 150 bpm,EFW 1364 g 32%

## 2018-11-01 NOTE — Patient Instructions (Signed)
Catherine Haney, I greatly value your feedback.  If you receive a survey following your visit with us today, we appreciate you taking the time to fill it out.  Thanks, Catherine HaffKim Keila Haney, CNM, [facility]  [facility] It is now [facility] ([address] [place], [place] [address]) Entrance located off of [address] Free 24/7 valet parking    Call the office [phone]) or go to [facility] if:  You begin to have strong, frequent contractions  Your water breaks.  Sometimes it is a big gush of fluid, sometimes it is just a trickle that keeps getting your panties wet or running down your legs  You have vaginal bleeding.  It is normal to have a small amount of spotting if your cervix was checked.   You don't feel your baby moving like normal.  If you don't, get you something to eat and drink and lay down and focus on feeling your baby move.  You should feel at least 10 movements in 2 hours.  If you don't, you should call the office or go to [facility].    Tdap Vaccine  It is recommended that you get the Tdap vaccine during the third trimester of EACH pregnancy to help protect your baby from getting pertussis (whooping cough)  27-36 weeks is the BEST time to do this so that you can pass the protection on to your baby. During pregnancy is better than after pregnancy, but if you are unable to get it during pregnancy it will be offered at the hospital.   You can get this vaccine with us, at the health department, your family doctor, or some local pharmacies  Everyone who will be around your baby should also be up-to-date on their vaccines before the baby comes. Adults (who are not pregnant) only need 1 dose of Tdap during adulthood.   Third Trimester of Pregnancy The third trimester is from week 29 through week 42, months 7 through 9. The third trimester is a time when the fetus is growing rapidly. At the end of the ninth month, the fetus is about  20 inches in length and weighs 6-10 pounds.  BODY CHANGES Your body goes through many changes during pregnancy. The changes vary from woman to woman.   Your weight will continue to increase. You can expect to gain 25-35 pounds (11-16 kg) by the end of the pregnancy.  You may begin to get stretch marks on your hips, abdomen, and breasts.  You may urinate more often because the fetus is moving lower into your pelvis and pressing on your bladder.  You may develop or continue to have heartburn as a result of your pregnancy.  You may develop constipation because certain hormones are causing the muscles that push waste through your intestines to slow down.  You may develop hemorrhoids or swollen, bulging veins (varicose veins).  You may have pelvic pain because of the weight gain and pregnancy hormones relaxing your joints between the bones in your pelvis. Backaches may result from overexertion of the muscles supporting your posture.  You may have changes in your hair. These can include thickening of your hair, rapid growth, and changes in texture. Some women also have hair loss during or after pregnancy, or hair that feels dry or thin. Your hair will most likely return to normal after your baby is born.  Your breasts will continue to grow and be tender. A yellow discharge may leak from  your breasts called colostrum.  Your belly button may stick out.  You may feel short of breath because of your expanding uterus.  You may notice the fetus "dropping," or moving lower in your abdomen.  You may have a bloody mucus discharge. This usually occurs a few days to a week before labor begins.  Your cervix becomes thin and soft (effaced) near your due date. WHAT TO EXPECT AT YOUR PRENATAL EXAMS  You will have prenatal exams every 2 weeks until week 36. Then, you will have weekly prenatal exams. During a routine prenatal visit:  You will be weighed to make sure you and the fetus are growing  normally.  Your blood pressure is taken.  Your abdomen will be measured to track your baby's growth.  The fetal heartbeat will be listened to.  Any test results from the previous visit will be discussed.  You may have a cervical check near your due date to see if you have effaced. At around 36 weeks, your caregiver will check your cervix. At the same time, your caregiver will also perform a test on the secretions of the vaginal tissue. This test is to determine if a type of bacteria, Group B streptococcus, is present. Your caregiver will explain this further. Your caregiver may ask you:  What your birth plan is.  How you are feeling.  If you are feeling the baby move.  If you have had any abnormal symptoms, such as leaking fluid, bleeding, severe headaches, or abdominal cramping.  If you have any questions. Other tests or screenings that may be performed during your third trimester include:  Blood tests that check for low iron levels (anemia).  Fetal testing to check the health, activity level, and growth of the fetus. Testing is done if you have certain medical conditions or if there are problems during the pregnancy. FALSE LABOR You may feel small, irregular contractions that eventually go away. These are called Braxton Hicks contractions, or false labor. Contractions may last for hours, days, or even weeks before true labor sets in. If contractions come at regular intervals, intensify, or become painful, it is best to be seen by your caregiver.  SIGNS OF LABOR   Menstrual-like cramps.  Contractions that are 5 minutes apart or less.  Contractions that start on the top of the uterus and spread down to the lower abdomen and back.  A sense of increased pelvic pressure or back pain.  A watery or bloody mucus discharge that comes from the vagina. If you have any of these signs before the 37th week of pregnancy, call your caregiver right away. You need to go to the hospital to  get checked immediately. HOME CARE INSTRUCTIONS   Avoid all smoking, herbs, alcohol, and unprescribed drugs. These chemicals affect the formation and growth of the baby.  Follow your caregiver's instructions regarding medicine use. There are medicines that are either safe or unsafe to take during pregnancy.  Exercise only as directed by your caregiver. Experiencing uterine cramps is a good sign to stop exercising.  Continue to eat regular, healthy meals.  Wear a good support bra for breast tenderness.  Do not use hot tubs, steam rooms, or saunas.  Wear your seat belt at all times when driving.  Avoid raw meat, uncooked cheese, cat litter boxes, and soil used by cats. These carry germs that can cause birth defects in the baby.  Take your prenatal vitamins.  Try taking a stool softener (if your caregiver approves) if  you develop constipation. Eat more high-fiber foods, such as fresh vegetables or fruit and whole grains. Drink plenty of fluids to keep your urine clear or pale yellow.  Take warm sitz baths to soothe any pain or discomfort caused by hemorrhoids. Use hemorrhoid cream if your caregiver approves.  If you develop varicose veins, wear support hose. Elevate your feet for 15 minutes, 3-4 times a day. Limit salt in your diet.  Avoid heavy lifting, wear low heal shoes, and practice good posture.  Rest a lot with your legs elevated if you have leg cramps or low back pain.  Visit your dentist if you have not gone during your pregnancy. Use a soft toothbrush to brush your teeth and be gentle when you floss.  A sexual relationship may be continued unless your caregiver directs you otherwise.  Do not travel far distances unless it is absolutely necessary and only with the approval of your caregiver.  Take prenatal classes to understand, practice, and ask questions about the labor and delivery.  Make a trial run to the hospital.  Pack your hospital bag.  Prepare the baby's  nursery.  Continue to go to all your prenatal visits as directed by your caregiver. SEEK MEDICAL CARE IF:  You are unsure if you are in labor or if your water has broken.  You have dizziness.  You have mild pelvic cramps, pelvic pressure, or nagging pain in your abdominal area.  You have persistent nausea, vomiting, or diarrhea.  You have a bad smelling vaginal discharge.  You have pain with urination. SEEK IMMEDIATE MEDICAL CARE IF:   You have a fever.  You are leaking fluid from your vagina.  You have spotting or bleeding from your vagina.  You have severe abdominal cramping or pain.  You have rapid weight loss or gain.  You have shortness of breath with chest pain.  You notice sudden or extreme swelling of your face, hands, ankles, feet, or legs.  You have not felt your baby move in over an hour.  You have severe headaches that do not go away with medicine.  You have vision changes. Document Released: 05/03/2001 Document Revised: 05/14/2013 Document Reviewed: 07/10/2012 [facility] Patient Information 2015 [place], [place]. This information is not intended to replace advice given to you by your health care provider. Make sure you discuss any questions you have with your health care provider.

## 2018-11-22 ENCOUNTER — Encounter: Payer: Self-pay | Admitting: Advanced Practice Midwife

## 2018-11-22 ENCOUNTER — Ambulatory Visit (INDEPENDENT_AMBULATORY_CARE_PROVIDER_SITE_OTHER): Payer: Medicaid Other | Admitting: Advanced Practice Midwife

## 2018-11-22 ENCOUNTER — Other Ambulatory Visit: Payer: Self-pay

## 2018-11-22 VITALS — BP 100/63 | HR 91 | Wt 126.0 lb

## 2018-11-22 DIAGNOSIS — Z3483 Encounter for supervision of other normal pregnancy, third trimester: Secondary | ICD-10-CM

## 2018-11-22 DIAGNOSIS — E559 Vitamin D deficiency, unspecified: Secondary | ICD-10-CM | POA: Diagnosis not present

## 2018-11-22 DIAGNOSIS — Z1389 Encounter for screening for other disorder: Secondary | ICD-10-CM

## 2018-11-22 DIAGNOSIS — D508 Other iron deficiency anemias: Secondary | ICD-10-CM

## 2018-11-22 DIAGNOSIS — Z331 Pregnant state, incidental: Secondary | ICD-10-CM

## 2018-11-22 DIAGNOSIS — Z3A32 32 weeks gestation of pregnancy: Secondary | ICD-10-CM

## 2018-11-22 LAB — POCT URINALYSIS DIPSTICK OB
Blood, UA: NEGATIVE
Glucose, UA: NEGATIVE
Ketones, UA: NEGATIVE
Nitrite, UA: NEGATIVE
POC,PROTEIN,UA: NEGATIVE

## 2018-11-22 NOTE — Progress Notes (Signed)
  [id number] [redacted]w[redacted]d Estimated Date of Delivery: 01/14/19  Blood pressure 100/63, pulse 91, weight 126 lb (57.2 kg), last menstrual period 04/09/2018.   BP weight and urine results all reviewed and noted.  Please refer to the obstetrical flow sheet for the fundal height and fetal heart rate documentation:  Patient reports good fetal movement, denies any bleeding and no rupture of membranes symptoms or regular contractions. [place], but never on meds. Dx w/vit d def in 2017, pt unaware Patient is without complaints. All questions were answered.   Physical Assessment:   Vitals:   11/22/18 1029  BP: 100/63  Pulse: 91  Weight: 126 lb (57.2 kg)  Body mass index is 23.05 kg/m.        Physical Examination:   General appearance: Well appearing, and in no distress  Mental status: Alert, oriented to person, place, and time  Skin: Warm & dry  Cardiovascular: Normal heart rate noted  Respiratory: Normal respiratory effort, no distress  Abdomen: Soft, gravid, nontender  Pelvic: Cervical exam deferred         Extremities: Edema: None  Fetal Status:     Movement: Present    Results for orders placed or performed in visit on 11/22/18 (from the past 24 hour(s))  POC Urinalysis Dipstick OB   Collection Time: 11/22/18 10:30 AM  Result Value Ref Range   Color, UA     Clarity, UA     Glucose, UA Negative Negative   Bilirubin, UA     Ketones, UA neg    Spec Grav, UA     Blood, UA neg    pH, UA     POC,PROTEIN,UA Negative Negative, Trace, Small (1+), Moderate (2+), Large (3+), 4+   Urobilinogen, UA     Nitrite, UA neg    Leukocytes, UA Trace (A) Negative   Appearance     Odor       Orders Placed This Encounter  Procedures  . TSH  . CBC  . Vitamin D 1,25 dihydroxy  . POC Urinalysis Dipstick OB    Plan:  Continued routine obstetrical care, continue Fe SO4 BID  Check tsh and vit d level  Return in about 2 weeks (around 12/06/2018) for tlelphone (not  webex) visit.

## 2018-11-27 LAB — VITAMIN D 1,25 DIHYDROXY
Vitamin D 1, 25 (OH)2 Total: 150 pg/mL — ABNORMAL HIGH
Vitamin D2 1, 25 (OH)2: <10 pg/mL
Vitamin D3 1, 25 (OH)2: 144 pg/mL

## 2018-11-27 LAB — CBC
Hematocrit: 29.2 % — ABNORMAL LOW (ref 34.0–46.6)
Hemoglobin: 9.7 g/dL — ABNORMAL LOW (ref 11.1–15.9)
MCH: 29.2 pg (ref 26.6–33.0)
MCHC: 33.2 g/dL (ref 31.5–35.7)
MCV: 88 fL (ref 79–97)
Platelets: 272 10*3/uL (ref 150–450)
RBC: 3.32 x10E6/uL — ABNORMAL LOW (ref 3.77–5.28)
RDW: 12.5 % (ref 11.7–15.4)
WBC: 7.9 10*3/uL (ref 3.4–10.8)

## 2018-11-29 ENCOUNTER — Telehealth: Payer: Self-pay | Admitting: *Deleted

## 2018-11-29 NOTE — Telephone Encounter (Signed)
Patient called stating she needs to be checked for certain labs. Please advise [phone]  Patient requested an interrupter

## 2018-11-30 NOTE — Telephone Encounter (Signed)
Pt aware of Kim's recommendations and voiced understanding. Pt is not taking Vit D, just a prenatal. I advised to continue taking prenatal and iron and increase iron rich foods. Advised she need to have TSH drawn. Order is in computer. Pt plans to go on Monday. Pt voiced understanding through interpreter. [place]

## 2018-11-30 NOTE — Telephone Encounter (Signed)
Pt checking status on hearing back.  

## 2018-12-03 ENCOUNTER — Telehealth: Payer: Self-pay | Admitting: Family Medicine

## 2018-12-03 NOTE — Telephone Encounter (Signed)
Please schedule with me for a virtual visit we can always use a translator, give me the number of the translator in the information for the virtual visit.

## 2018-12-03 NOTE — Telephone Encounter (Signed)
Patient does not want to do a televisit

## 2018-12-03 NOTE — Telephone Encounter (Signed)
As long as she is not having diarrhea she can come in, if she wants to see somebody else she can see the call person tomorrow if not I am on call Wednesday and we can put her on that schedule

## 2018-12-03 NOTE — Telephone Encounter (Signed)
Pt is having continued heartburn Requesting something new Please advise

## 2018-12-04 ENCOUNTER — Other Ambulatory Visit: Payer: Self-pay

## 2018-12-05 ENCOUNTER — Encounter: Payer: Self-pay | Admitting: Family Medicine

## 2018-12-05 ENCOUNTER — Ambulatory Visit (INDEPENDENT_AMBULATORY_CARE_PROVIDER_SITE_OTHER): Payer: Medicaid Other | Admitting: Family Medicine

## 2018-12-05 VITALS — BP 104/66 | HR 92 | Temp 97.0°F | Ht 62.0 in | Wt 128.8 lb

## 2018-12-05 DIAGNOSIS — Z3483 Encounter for supervision of other normal pregnancy, third trimester: Secondary | ICD-10-CM | POA: Diagnosis not present

## 2018-12-05 DIAGNOSIS — K219 Gastro-esophageal reflux disease without esophagitis: Secondary | ICD-10-CM | POA: Diagnosis not present

## 2018-12-05 DIAGNOSIS — O99019 Anemia complicating pregnancy, unspecified trimester: Secondary | ICD-10-CM | POA: Diagnosis not present

## 2018-12-05 MED ORDER — OMEPRAZOLE 20 MG PO CPDR: 20.0000 mg | DELAYED_RELEASE_CAPSULE | Freq: Every day | ORAL | 4 refills | Status: DC

## 2018-12-05 NOTE — Progress Notes (Signed)
BP 104/66   Pulse 92   Temp (!) 97 F (36.1 C) (Oral)   Ht 5\' 2"  (1.575 m)   Wt 128 lb 12.8 oz (58.4 kg)   LMP 04/09/2018   BMI 23.56 kg/m    Subjective:   Patient ID: Catherine Haney, female    DOB: [birth year], 28 y.o.   MRN: [id number]  HPI: Catherine Haney is a 28 y.o. female presenting on 12/05/2018 for Abdominal Pain (x 1 month - Patient states that her heart burn is not any better.)   HPI Epigastric abdominal pain in pregnancy Patient comes in with continued epigastric abdominal pain.  She has been fighting the acid reflux and GERD.  She has been using an H2 blocker without much success, she has been using cimetidine once a day.  She feels like it is getting worse as her pregnancy progresses and would like to try something else for it.  She had tried Zantac before but it got recalled.  Patient has just started her third trimester of pregnancy.  Relevant past medical, surgical, family and social history reviewed and updated as indicated. Interim medical history since our last visit reviewed. Allergies and medications reviewed and updated.  Review of Systems  Constitutional: Negative for chills and fever.  Eyes: Negative for visual disturbance.  Respiratory: Negative for chest tightness and shortness of breath.   Cardiovascular: Negative for chest pain and leg swelling.  Gastrointestinal: Positive for abdominal pain and nausea. Negative for abdominal distention, blood in stool, constipation, diarrhea and vomiting.  Genitourinary: Negative for difficulty urinating and dysuria.  Musculoskeletal: Negative for back pain and gait problem.  Skin: Negative for rash.  Neurological: Negative for light-headedness and headaches.  Psychiatric/Behavioral: Negative for agitation and behavioral problems.  All other systems reviewed and are negative.   Per HPI unless specifically indicated above   Allergies as of 12/05/2018   No Known Allergies     Medication List       Accurate as  of December 05, 2018 11:43 AM. If you have any questions, ask your nurse or doctor.        omeprazole 20 MG capsule Commonly known as: PRILOSEC Take 1 capsule (20 mg total) by mouth daily. Started by: Elige RadonJoshua A Nayzeth Altman, MD   PrePLUS 27-1 MG Tabs Take 1 tablet by mouth daily.   SLOW FE PO Take by mouth daily.        Objective:   BP 104/66   Pulse 92   Temp (!) 97 F (36.1 C) (Oral)   Ht 5\' 2"  (1.575 m)   Wt 128 lb 12.8 oz (58.4 kg)   LMP 04/09/2018   BMI 23.56 kg/m   Wt Readings from Last 3 Encounters:  12/05/18 128 lb 12.8 oz (58.4 kg)  11/22/18 126 lb (57.2 kg)  11/01/18 124 lb (56.2 kg)    Physical Exam Vitals signs and nursing note reviewed.  Constitutional:      General: She is not in acute distress.    Appearance: She is well-developed. She is not diaphoretic.  Eyes:     Conjunctiva/sclera: Conjunctivae normal.  Cardiovascular:     Rate and Rhythm: Normal rate and regular rhythm.     Heart sounds: Normal heart sounds. No murmur.  Pulmonary:     Effort: Pulmonary effort is normal. No respiratory distress.     Breath sounds: Normal breath sounds. No wheezing.  Abdominal:     Tenderness: There is abdominal tenderness in the epigastric area. There is no right  CVA tenderness, left CVA tenderness, guarding or rebound.     Comments: Gravid abdomen  Skin:    General: Skin is warm and dry.     Findings: No rash.  Neurological:     Mental Status: She is alert and oriented to person, place, and time.     Coordination: Coordination normal.  Psychiatric:        Behavior: Behavior normal.       Assessment & Plan:   Problem List Items Addressed This Visit      Digestive   GERD (gastroesophageal reflux disease) - Primary   Relevant Medications   omeprazole (PRILOSEC) 20 MG capsule   Other Relevant Orders   TSH (Completed)   CBC with Differential/Platelet (Completed)   VITAMIN D 25 Hydroxy (Vit-D Deficiency, Fractures) (Completed)     Other   Supervision  of normal pregnancy   Relevant Orders   TSH (Completed)   CBC with Differential/Platelet (Completed)   VITAMIN D 25 Hydroxy (Vit-D Deficiency, Fractures) (Completed)   Anemia affecting third pregnancy   Relevant Orders   TSH (Completed)   CBC with Differential/Platelet (Completed)   VITAMIN D 25 Hydroxy (Vit-D Deficiency, Fractures) (Completed)      Her OB doctor wanted her to recheck her CBC and thyroid and vitamin D and we will send these off but make sure we see see her OB doctor with the results.  Added omeprazole for her increased heartburn and recommended that she could still use the cimetidine as needed but she says is just not enough so we will try the omeprazole first  Follow up plan: Return if symptoms worsen or fail to improve.  Counseling provided for all of the vaccine components Orders Placed This Encounter  Procedures  . TSH  . CBC with Differential/Platelet  . VITAMIN D 25 Hydroxy (Vit-D Deficiency, Fractures)    Caryl Pina, MD [facility] 12/05/2018, 11:43 AM

## 2018-12-06 ENCOUNTER — Other Ambulatory Visit: Payer: Self-pay

## 2018-12-06 ENCOUNTER — Ambulatory Visit (INDEPENDENT_AMBULATORY_CARE_PROVIDER_SITE_OTHER): Payer: Medicaid Other | Admitting: Advanced Practice Midwife

## 2018-12-06 VITALS — BP 123/76 | HR 95 | Wt 128.0 lb

## 2018-12-06 DIAGNOSIS — Z3483 Encounter for supervision of other normal pregnancy, third trimester: Secondary | ICD-10-CM | POA: Diagnosis not present

## 2018-12-06 DIAGNOSIS — Z3A34 34 weeks gestation of pregnancy: Secondary | ICD-10-CM

## 2018-12-06 LAB — CBC WITH DIFFERENTIAL/PLATELET
Basophils Absolute: 0 10*3/uL (ref 0.0–0.2)
Basos: 0 %
EOS (ABSOLUTE): 0.1 10*3/uL (ref 0.0–0.4)
Eos: 2 %
Hematocrit: 29.3 % — ABNORMAL LOW (ref 34.0–46.6)
Hemoglobin: 10.1 g/dL — ABNORMAL LOW (ref 11.1–15.9)
Immature Grans (Abs): 0.1 10*3/uL (ref 0.0–0.1)
Immature Granulocytes: 2 %
Lymphocytes Absolute: 1.8 10*3/uL (ref 0.7–3.1)
Lymphs: 27 %
MCH: 30 pg (ref 26.6–33.0)
MCHC: 34.5 g/dL (ref 31.5–35.7)
MCV: 87 fL (ref 79–97)
Monocytes Absolute: 0.5 10*3/uL (ref 0.1–0.9)
Monocytes: 8 %
Neutrophils Absolute: 4.1 10*3/uL (ref 1.4–7.0)
Neutrophils: 61 %
Platelets: 267 10*3/uL (ref 150–450)
RBC: 3.37 x10E6/uL — ABNORMAL LOW (ref 3.77–5.28)
RDW: 12.6 % (ref 11.7–15.4)
WBC: 6.7 10*3/uL (ref 3.4–10.8)

## 2018-12-06 LAB — VITAMIN D 25 HYDROXY (VIT D DEFICIENCY, FRACTURES): Vit D, 25-Hydroxy: 19.7 ng/mL — ABNORMAL LOW (ref 30.0–100.0)

## 2018-12-06 LAB — TSH: TSH: 2.79 u[IU]/mL (ref 0.450–4.500)

## 2018-12-06 NOTE — Progress Notes (Signed)
Interpreter [phone]

## 2018-12-06 NOTE — Progress Notes (Signed)
   TELEHEALTH VIRTUAL OBSTETRICS VISIT ENCOUNTER NOTE  I connected with Catherine Haney on 12/06/18 at  2:00 PM EDT by telephone at home and verified that I am speaking with the correct person using two identifiers.   I discussed the limitations, risks, security and privacy concerns of performing an evaluation and management service by telephone and the availability of in person appointments. I also discussed with the patient that there may be a patient responsible charge related to this service. The patient expressed understanding and agreed to proceed.  Subjective:  Catherine Haney is a 28 y.o. [id number] at [redacted]w[redacted]d being followed for ongoing prenatal care.  She is currently monitored for the following issues for this low-risk pregnancy and has Other seasonal allergic rhinitis; Chronic pain of multiple joints; Generalized anxiety disorder; GERD (gastroesophageal reflux disease); Hyperlipidemia; Atypical chest pain; Supervision of normal pregnancy; and Anemia affecting third pregnancy on their problem list.  Patient reports no complaints. Reports fetal movement. Denies any contractions, bleeding or leaking of fluid.   The following portions of the patient's history were reviewed and updated as appropriate: allergies, current medications, past family history, past medical history, past social history, past surgical history and problem list.   Objective:   General:  Alert, oriented and cooperative.   Mental Status: Normal mood and affect perceived. Normal judgment and thought content.  Rest of physical exam deferred due to type of encounter  Assessment and Plan:  Pregnancy: [id number] at [redacted]w[redacted]d   Vit D Deficiency:  Vit D1 ^^ but should be disregarded d/t probably PTU changes during pregnancy. Vit D 56 OH ordered by PCP, will defer to he/she for management.  Pt may continue PNV. Marland Kitchen Term labor symptoms and general obstetric precautions including but not limited to vaginal bleeding, contractions, leaking of  fluid and fetal movement were reviewed in detail with the patient.  I discussed the assessment and treatment plan with the patient. The patient was provided an opportunity to ask questions and all were answered. The patient agreed with the plan and demonstrated an understanding of the instructions. The patient was advised to call back or seek an in-person office evaluation/go to [facility] for any urgent or concerning symptoms. Please refer to After Visit Summary for other counseling recommendations.   I provided 10 minutes of non-face-to-face time during this encounter.  Return in about 2 weeks (around 12/20/2018) for [place].  Future Appointments  Date Time Provider [place]  12/11/2018 11:25 AM Dettinger, Fransisca Kaufmann, MD [facility] None  12/21/2018  9:00 AM Jonnie Kind, MD [facility]    Christin Fudge, [place] for [facility], [place]

## 2018-12-07 NOTE — Telephone Encounter (Signed)
Patient seen since phone call.  This encounter will now be closed 

## 2018-12-11 ENCOUNTER — Ambulatory Visit: Payer: Medicaid Other | Admitting: Family Medicine

## 2018-12-21 ENCOUNTER — Other Ambulatory Visit: Payer: Self-pay

## 2018-12-21 ENCOUNTER — Other Ambulatory Visit: Payer: Self-pay | Admitting: Obstetrics and Gynecology

## 2018-12-21 ENCOUNTER — Ambulatory Visit (INDEPENDENT_AMBULATORY_CARE_PROVIDER_SITE_OTHER): Payer: Medicaid Other | Admitting: Obstetrics and Gynecology

## 2018-12-21 VITALS — BP 97/67 | HR 93 | Wt 133.0 lb

## 2018-12-21 DIAGNOSIS — O26843 Uterine size-date discrepancy, third trimester: Secondary | ICD-10-CM

## 2018-12-21 DIAGNOSIS — Z3A36 36 weeks gestation of pregnancy: Secondary | ICD-10-CM | POA: Diagnosis not present

## 2018-12-21 DIAGNOSIS — Z3483 Encounter for supervision of other normal pregnancy, third trimester: Secondary | ICD-10-CM

## 2018-12-21 DIAGNOSIS — Z331 Pregnant state, incidental: Secondary | ICD-10-CM

## 2018-12-21 DIAGNOSIS — Z1389 Encounter for screening for other disorder: Secondary | ICD-10-CM

## 2018-12-21 LAB — POCT URINALYSIS DIPSTICK OB
Blood, UA: NEGATIVE
Glucose, UA: NEGATIVE
Ketones, UA: NEGATIVE
Leukocytes, UA: NEGATIVE
Nitrite, UA: NEGATIVE
POC,PROTEIN,UA: NEGATIVE

## 2018-12-21 NOTE — Progress Notes (Signed)
Patient ID: Catherine Haney, female   DOB: 03-27-1991, 28 y.o.   MRN: [id number]    LOW-RISK PREGNANCY VISIT Patient name: Catherine Haney MRN [id number]  Date of birth: 02/10/1991 Chief Complaint:   Routine Prenatal Visit (gbc,gc,chl)  History of Present Illness:   Catherine Haney is a 28 y.o. [phone] female at [redacted]w[redacted]d with an Estimated Date of Delivery: 01/14/19 being seen today for ongoing management of a low-risk pregnancy. Would like female provider to collect gbs/gcchl. pt will collect herself.  No nausea or vomiting, has upper abdominal pain  That goes into her mid-lower back Pt with size<Dates throughout pregnancy but EFW 32% at 29 wk Today she reports upper abdominal pain. Contractions: Not present.  .  Movement: Present. denies leaking of fluid. Review of Systems:   Pertinent items are noted in HPI Denies abnormal vaginal discharge w/ itching/odor/irritation, headaches, visual changes, shortness of breath, chest pain, abdominal pain, severe nausea/vomiting, or problems with urination or bowel movements unless otherwise stated above. Pertinent History Reviewed:  Reviewed past medical,surgical, social, obstetrical and family history.  Reviewed problem list, medications and allergies. Physical Assessment:   Vitals:   12/21/18 0902  BP: 97/67  Pulse: 93  Weight: 133 lb (60.3 kg)  Body mass index is 24.33 kg/m.        Physical Examination:   General appearance: Well appearing, and in no distress  Mental status: Alert, oriented to person, place, and time  Skin: Warm & dry  Cardiovascular: Normal heart rate noted  Respiratory: Normal respiratory effort, no distress  Abdomen: Soft, gravid, nontender  Pelvic: Cervical exam performed         Extremities: Edema: None  Fetal Status: Fetal Heart Rate (bpm): 145 Fundal Height: 32 cm Movement: Present    Results for orders placed or performed in visit on 12/21/18 (from the past 24 hour(s))  POC Urinalysis Dipstick OB   Collection Time:  12/21/18  9:04 AM  Result Value Ref Range   Color, UA     Clarity, UA     Glucose, UA Negative Negative   Bilirubin, UA     Ketones, UA neg    Spec Grav, UA     Blood, UA neg    pH, UA     POC,PROTEIN,UA Negative Negative, Trace, Small (1+), Moderate (2+), Large (3+), 4+   Urobilinogen, UA     Nitrite, UA neg    Leukocytes, UA Negative Negative   Appearance     Odor      Assessment & Plan:  1) Low-risk pregnancy [id number] at [redacted]w[redacted]d with an Estimated Date of Delivery: 01/14/19  2 size <Dates in petite female, prior EFW 32% at 29 wk. Recheck EFW 1 wk.    Meds: No orders of the defined types were placed in this encounter.  Labs/procedures today: GBS/GCCHL collected by pt   Plan:   1. Continue routine obstetrical care  2. F/u in 1 week EFW u/s  Reviewed: Preterm labor symptoms and general obstetric precautions including but not limited to vaginal bleeding, contractions, leaking of fluid and fetal movement were reviewed in detail with the patient.  All questions were answered.   Follow-up: Return in about 1 week (around 12/28/2018) for Televisit.  Orders Placed This Encounter  Procedures  . Strep Gp B NAA  . GC/Chlamydia Probe Amp  . POC Urinalysis Dipstick OB   By signing my name below, I, Samul Dada, attest that this documentation has been prepared under the direction and in the presence of Crockett,  Cyndi LennertJohn V, MD. Electronically Signed: Arnette NorrisMari Johnson Medical Scribe. 12/21/18. 9:19 AM.  .jgd

## 2018-12-23 LAB — STREP GP B NAA: Strep Gp B NAA: NEGATIVE

## 2018-12-24 ENCOUNTER — Ambulatory Visit (INDEPENDENT_AMBULATORY_CARE_PROVIDER_SITE_OTHER): Payer: Medicaid Other

## 2018-12-24 ENCOUNTER — Ambulatory Visit (INDEPENDENT_AMBULATORY_CARE_PROVIDER_SITE_OTHER): Payer: Medicaid Other | Admitting: Women's Health

## 2018-12-24 ENCOUNTER — Other Ambulatory Visit: Payer: Self-pay

## 2018-12-24 ENCOUNTER — Encounter: Payer: Self-pay | Admitting: Women's Health

## 2018-12-24 VITALS — BP 112/76 | HR 89 | Wt 134.0 lb

## 2018-12-24 DIAGNOSIS — O26843 Uterine size-date discrepancy, third trimester: Secondary | ICD-10-CM

## 2018-12-24 DIAGNOSIS — Z331 Pregnant state, incidental: Secondary | ICD-10-CM

## 2018-12-24 DIAGNOSIS — Z3A37 37 weeks gestation of pregnancy: Secondary | ICD-10-CM

## 2018-12-24 DIAGNOSIS — Z3483 Encounter for supervision of other normal pregnancy, third trimester: Secondary | ICD-10-CM

## 2018-12-24 DIAGNOSIS — K068 Other specified disorders of gingiva and edentulous alveolar ridge: Secondary | ICD-10-CM

## 2018-12-24 DIAGNOSIS — M940 Chondrocostal junction syndrome [Tietze]: Secondary | ICD-10-CM

## 2018-12-24 DIAGNOSIS — Z1389 Encounter for screening for other disorder: Secondary | ICD-10-CM

## 2018-12-24 DIAGNOSIS — O26893 Other specified pregnancy related conditions, third trimester: Secondary | ICD-10-CM

## 2018-12-24 MED ORDER — PENICILLIN V POTASSIUM 500 MG PO TABS: 500.0000 mg | ORAL_TABLET | Freq: Four times a day (QID) | ORAL | 0 refills | Status: DC

## 2018-12-24 NOTE — Patient Instructions (Addendum)
Catherine Haney, I greatly value your feedback.  If you receive a survey following your visit with us today, we appreciate you taking the time to fill it out.  Thanks, Joellyn HaffKim Joseth Weigel, CNM, WHNP-BC  Denist Dr. Blondell RevealJudkins [phone]  [facility] is now [facility] ([address] [place], [place] [address]) Entrance located off of [address] Free 24/7 valet parking   Go to [email or web address] to register for FREE online childbirth classes    Call the office [phone]) or go to [facility] if:  You begin to have strong, frequent contractions  Your water breaks.  Sometimes it is a big gush of fluid, sometimes it is just a trickle that keeps getting your panties wet or running down your legs  You have vaginal bleeding.  It is normal to have a small amount of spotting if your cervix was checked.   You don't feel your baby moving like normal.  If you don't, get you something to eat and drink and lay down and focus on feeling your baby move.  You should feel at least 10 movements in 2 hours.  If you don't, you should call the office or go to [facility].   Home Blood Pressure Monitoring for Patients   Your provider has recommended that you check your blood pressure (BP) at least once a week at home. If you do not have a blood pressure cuff at home, one will be provided for you. Contact your provider if you have not received your monitor within 1 week.   Helpful Tips for Accurate Home Blood Pressure Checks   Don't smoke, exercise, or drink caffeine 30 minutes before checking your BP  Use the restroom before checking your BP (a full bladder can raise your pressure)  Relax in a comfortable upright chair  Feet on the ground  Left arm resting comfortably on a flat surface at the level of your heart  Legs uncrossed  Back supported  Sit quietly and don't talk  Place the cuff on your bare arm  Adjust snuggly, so that only two  fingertips can fit between your skin and the top of the cuff  Check 2 readings separated by at least one minute  Keep a log of your BP readings  For a visual, please reference this diagram: http://ccnc.care/bpdiagram  Provider Name: [facility]     Phone: [phone]  Zone 1: ALL CLEAR  Continue to monitor your symptoms:   BP reading is less than 140 (top number) or less than 90 (bottom number)   No right upper stomach pain  No headaches or seeing spots  No feeling nauseated or throwing up  No swelling in face and hands  Zone 2: CAUTION Call your doctor's office for any of the following:   BP reading is greater than 140 (top number) or greater than 90 (bottom number)   Stomach pain under your ribs in the middle or right side  Headaches or seeing spots  Feeling nauseated or throwing up  Swelling in face and hands  Zone 3: EMERGENCY  Seek immediate medical care if you have any of the following:   BP reading is greater than160 (top number) or greater than 110 (bottom number)  Severe headaches not improving with Tylenol  Serious difficulty catching your breath  Any worsening symptoms from Zone 2    Braxton Hicks Contractions Contractions of the uterus can occur throughout pregnancy, but they are not always a  sign that you are in labor. You may have practice contractions called Braxton Hicks contractions. These false labor contractions are sometimes confused with true labor. What are [place] contractions? Braxton Hicks contractions are tightening movements that occur in the muscles of the uterus before labor. Unlike true labor contractions, these contractions do not result in opening (dilation) and thinning of the cervix. Toward the end of pregnancy (32-34 weeks), Braxton Hicks contractions can happen more often and may become stronger. These contractions are sometimes difficult to tell apart from true labor because they can be very uncomfortable. You  should not feel embarrassed if you go to the hospital with false labor. Sometimes, the only way to tell if you are in true labor is for your health care provider to look for changes in the cervix. The health care provider will do a physical exam and may monitor your contractions. If you are not in true labor, the exam should show that your cervix is not dilating and your water has not broken. If there are no other health problems associated with your pregnancy, it is completely safe for you to be sent home with false labor. You may continue to have Braxton Hicks contractions until you go into true labor. How to tell the difference between true labor and false labor True labor  Contractions last 30-70 seconds.  Contractions become very regular.  Discomfort is usually felt in the top of the uterus, and it spreads to the lower abdomen and low back.  Contractions do not go away with walking.  Contractions usually become more intense and increase in frequency.  The cervix dilates and gets thinner. False labor  Contractions are usually shorter and not as strong as true labor contractions.  Contractions are usually irregular.  Contractions are often felt in the front of the lower abdomen and in the groin.  Contractions may go away when you walk around or change positions while lying down.  Contractions get weaker and are shorter-lasting as time goes on.  The cervix usually does not dilate or become thin. Follow these instructions at home:   Take over-the-counter and prescription medicines only as told by your health care provider.  Keep up with your usual exercises and follow other instructions from your health care provider.  Eat and drink lightly if you think you are going into labor.  If Braxton Hicks contractions are making you uncomfortable: ? Change your position from lying down or resting to walking, or change from walking to resting. ? Sit and rest in a tub of warm  water. ? Drink enough fluid to keep your urine pale yellow. Dehydration may cause these contractions. ? Do slow and deep breathing several times an hour.  Keep all follow-up prenatal visits as told by your health care provider. This is important. Contact a health care provider if:  You have a fever.  You have continuous pain in your abdomen. Get help right away if:  Your contractions become stronger, more regular, and closer together.  You have fluid leaking or gushing from your vagina.  You pass blood-tinged mucus (bloody show).  You have bleeding from your vagina.  You have low back pain that you never had before.  You feel your babys head pushing down and causing pelvic pressure.  Your baby is not moving inside you as much as it used to. Summary  Contractions that occur before labor are called Braxton Hicks contractions, false labor, or practice contractions.  Braxton Hicks contractions are usually shorter,  weaker, farther apart, and less regular than true labor contractions. True labor contractions usually become progressively stronger and regular, and they become more frequent.  Manage discomfort from [facility] contractions by changing position, resting in a warm bath, drinking plenty of water, or practicing deep breathing. This information is not intended to replace advice given to you by your health care provider. Make sure you discuss any questions you have with your health care provider. Document Released: 09/22/2016 Document Revised: 04/21/2017 Document Reviewed: 09/22/2016 Elsevier Patient Education  2020 [employer]

## 2018-12-24 NOTE — Progress Notes (Signed)
   LOW-RISK PREGNANCY VISIT Patient name: Catherine Haney MRN [id number]  Date of birth: May 27, 1990 Chief Complaint:   Routine Prenatal Visit  History of Present Illness:   Catherine Haney is a 28 y.o. [id number] female at [redacted]w[redacted]d with an Estimated Date of Delivery: 01/14/19 being seen today for ongoing management of a low-risk pregnancy.  Today she reports Rt upper back gum pain x 3days, no tooth pain. RUQ pain radiating around to back, not worse after food, no n/v, hurts when walks, apap doesn't really help. Contractions: Not present. Vag. Bleeding: None.  Movement: Present. denies leaking of fluid. Review of Systems:   Pertinent items are noted in HPI Denies abnormal vaginal discharge w/ itching/odor/irritation, headaches, visual changes, shortness of breath, chest pain, abdominal pain, severe nausea/vomiting, or problems with urination or bowel movements unless otherwise stated above. Pertinent History Reviewed:  Reviewed past medical,surgical, social, obstetrical and family history.  Reviewed problem list, medications and allergies. Physical Assessment:   Vitals:   12/24/18 1019  BP: 112/76  Pulse: 89  Weight: 134 lb (60.8 kg)  Body mass index is 24.51 kg/m.        Physical Examination:   General appearance: Well appearing, and in no distress  Mental status: Alert, oriented to person, place, and time  Skin: Warm & dry  Teeth: Rt upper back gums appear normal, no obvious abscess, but very tender to touch  Cardiovascular: Normal heart rate noted  Respiratory: Normal respiratory effort, no distress  Abdomen: Soft, gravid, nontender, Rt lower ribs and cartilage tender to touch, no RUQ/liver pain  Pelvic: Cervical exam deferred         Extremities: Edema: None  Fetal Status: Fetal Heart Rate (bpm): 148   Movement: Present    [place] today for s<d: 37 wks,cephalic,anterior placenta gr 3,afi 11 cm,fhr 148 bpm,efw 3084 g 56%  No results found for this or any previous visit (from the past 24  hour(s)).  Assessment & Plan:  1) Low-risk pregnancy [id number] at [redacted]w[redacted]d with an Estimated Date of Delivery: 01/14/19   2) Size <dates, efw 56%, afi 11cm  3) Costochondritis> apap prn  4) Rt upper gum pain> rx pcn, pt to call Dr. Woodfin Ganja for appt    Meds:  Meds ordered this encounter  Medications  . penicillin v potassium (VEETID) 500 MG tablet    Sig: Take 1 tablet (500 mg total) by mouth 4 (four) times daily. X 7 days    Dispense:  28 tablet    Refill:  0    Order Specific Question:   Supervising Provider    Answer:   Florian Buff [[id number]]   Labs/procedures today: u/s  Plan:  Continue routine obstetrical care   Reviewed: Term labor symptoms and general obstetric precautions including but not limited to vaginal bleeding, contractions, leaking of fluid and fetal movement were reviewed in detail with the patient.  All questions were answered.   Follow-up: Return in about 1 week (around 12/31/2018) for Sun, in person.  Orders Placed This Encounter  Procedures  . POC Urinalysis Dipstick OB   Roma Schanz CNM, [facility] 12/24/2018 11:17 AM

## 2018-12-24 NOTE — Progress Notes (Signed)
[place] 37 wks,cephalic,anterior placenta gr 3,afi 11 cm,fhr 148 bpm,efw 3084 g 56%

## 2018-12-28 ENCOUNTER — Telehealth: Payer: Self-pay | Admitting: Obstetrics and Gynecology

## 2018-12-28 LAB — GC/CHLAMYDIA PROBE AMP
Chlamydia trachomatis, NAA: NEGATIVE
Neisseria Gonorrhoeae by PCR: NEGATIVE

## 2018-12-28 NOTE — Telephone Encounter (Signed)
We have you scheduled for an upcoming appointment at our office. At this time, we are still not allowing any visitors or children to come in with you during your appointment time unless you need physical assistance. We understand this may be different from your past appointments and know this may be difficult but our goal is to keep everyone safe.   We ask if you have had any exposure to anyone suspected or confirmed of having COVID-19 or if you are experiencing any of the following, to call and reschedule your appointment: fever, cough, shortness of breath, muscle pain, diarrhea, rash, vomiting, abdominal pain, red eye, weakness, bruising, bleeding, joint pain, or a severe headache.   Also,to keep you safe, please use the provided hand sanitizer when you enter the office. We are asking everyone in the office to wear a mask to help prevent the spread of germs. If you have a mask of your own, please wear it to your appointment, if not, we are happy to provide one for you.  We ask that you complete your E-check-in via mychart prior to your arrival and check-in via [facility]. You may either call our office when you arrive in our office parking lot to complete your registration over the phone or come into our office to check-in.    Please know we will ask you these questions or similar questions when you arrive for your appointment and again its how we are keeping everyone safe.  Thank you for understanding and your cooperation.   [facility] Staff

## 2018-12-31 ENCOUNTER — Encounter: Payer: Self-pay | Admitting: Obstetrics and Gynecology

## 2018-12-31 ENCOUNTER — Ambulatory Visit (INDEPENDENT_AMBULATORY_CARE_PROVIDER_SITE_OTHER): Payer: Medicaid Other | Admitting: Obstetrics and Gynecology

## 2018-12-31 ENCOUNTER — Other Ambulatory Visit: Payer: Self-pay

## 2018-12-31 VITALS — BP 110/74 | HR 96 | Wt 134.6 lb

## 2018-12-31 DIAGNOSIS — Z3483 Encounter for supervision of other normal pregnancy, third trimester: Secondary | ICD-10-CM

## 2018-12-31 DIAGNOSIS — Z3A38 38 weeks gestation of pregnancy: Secondary | ICD-10-CM | POA: Diagnosis not present

## 2018-12-31 DIAGNOSIS — O368130 Decreased fetal movements, third trimester, not applicable or unspecified: Secondary | ICD-10-CM

## 2018-12-31 DIAGNOSIS — Z1389 Encounter for screening for other disorder: Secondary | ICD-10-CM

## 2018-12-31 DIAGNOSIS — Z331 Pregnant state, incidental: Secondary | ICD-10-CM

## 2018-12-31 LAB — POCT URINALYSIS DIPSTICK OB
Blood, UA: NEGATIVE
Glucose, UA: NEGATIVE
Ketones, UA: NEGATIVE
Leukocytes, UA: NEGATIVE
Nitrite, UA: NEGATIVE
POC,PROTEIN,UA: NEGATIVE

## 2018-12-31 NOTE — Progress Notes (Signed)
Patient ID: Catherine Haney, female   DOB: 05-Aug-1990, 28 y.o.   MRN: [id number]    LOW-RISK PREGNANCY VISIT Patient name: Catherine Haney MRN [id number]  Date of birth: 11/10/1990 Chief Complaint:   Routine Prenatal Visit (decreased baby movement)  History of Present Illness:   Catherine Haney is a 28 y.o. [phone] female at [redacted]w[redacted]d with an Estimated Date of Delivery: 01/14/19 being seen today for ongoing management of a low-risk pregnancy. Still has pain on upper right side. No nausea and vomiting, no worse with eating, pain is worse at night. Feels pain in her muscle and pain wraps around into her back. U/s ruled gall bladder Today she reports persistent RUQ pain. Contractions: Not present. Vag. Bleeding: None.  Movement: (!) Decreased. Placed on NST . Wonderfully reactive and pt feels FM thru the NST. denies leaking of fluid. Review of Systems:   Pertinent items are noted in HPI Denies abnormal vaginal discharge w/ itching/odor/irritation, headaches, visual changes, shortness of breath, chest pain, abdominal pain, severe nausea/vomiting, or problems with urination or bowel movements unless otherwise stated above. Pertinent History Reviewed:  Reviewed past medical,surgical, social, obstetrical and family history.  Reviewed problem list, medications and allergies. Physical Assessment:   Vitals:   12/31/18 0858  BP: 110/74  Pulse: 96  Weight: 134 lb 9.6 oz (61.1 kg)  Body mass index is 24.62 kg/m.        Physical Examination:   General appearance: Well appearing, and in no distress  Mental status: Alert, oriented to person, place, and time  Skin: Warm & dry  Cardiovascular: Normal heart rate noted  Respiratory: Normal respiratory effort, no distress  Abdomen: Soft, gravid, nontender 34 cm, feels bigger to me and to pt.  Pelvic: Cervical exam deferred         Extremities: Edema: None  Fetal Status:     Movement: (!) Decreased    NST reactive.  Results for orders placed or performed in visit  on 12/31/18 (from the past 24 hour(s))  POC Urinalysis Dipstick OB   Collection Time: 12/31/18  9:00 AM  Result Value Ref Range   Color, UA     Clarity, UA     Glucose, UA Negative Negative   Bilirubin, UA     Ketones, UA neg    Spec Grav, UA     Blood, UA neg    pH, UA     POC,PROTEIN,UA Negative Negative, Trace, Small (1+), Moderate (2+), Large (3+), 4+   Urobilinogen, UA     Nitrite, UA neg    Leukocytes, UA Negative Negative   Appearance     Odor      Assessment & Plan:  1) Low-risk pregnancy [id number] at [redacted]w[redacted]d with an Estimated Date of Delivery: 01/14/19   2) Persistent RUQ pain, tylenol & warm compresses, no suspicion of biliary colic based on Hx.   Meds: No orders of the defined types were placed in this encounter.  Labs/procedures today: NST reactive.  Plan:  F/u 1 week. Monitor RUQ pain, if pain worsens with food. Call for f/u  Reviewed: Term labor symptoms and general obstetric precautions including but not limited to vaginal bleeding, contractions, leaking of fluid and fetal movement were reviewed in detail with the patient.   Follow-up: Return in about 1 week (around 01/07/2019).  Orders Placed This Encounter  Procedures  . POC Urinalysis Dipstick OB   By signing my name below, I, Samul Dada, attest that this documentation has been prepared under the direction and in  the presence of Tilda BurrowFerguson, Estha Few V, MD. Electronically Signed: Arnette NorrisMari Johnson Medical Scribe. 12/31/18. 9:07 AM.  I personally performed the services described in this documentation, which was SCRIBED in my presence. The recorded information has been reviewed and considered accurate. It has been edited as necessary during review. Tilda BurrowJohn V Kortlyn Koltz, MD

## 2019-01-05 ENCOUNTER — Inpatient Hospital Stay (HOSPITAL_COMMUNITY)
Admission: AD | Admit: 2019-01-05 | Discharge: 2019-01-05 | Disposition: A | Discharge status: Home / Self Care | Payer: Medicaid Other | Attending: Obstetrics and Gynecology | Admitting: Obstetrics and Gynecology

## 2019-01-05 ENCOUNTER — Encounter (HOSPITAL_COMMUNITY): Payer: Self-pay | Admitting: *Deleted

## 2019-01-05 DIAGNOSIS — Z3483 Encounter for supervision of other normal pregnancy, third trimester: Secondary | ICD-10-CM

## 2019-01-05 DIAGNOSIS — O26893 Other specified pregnancy related conditions, third trimester: Secondary | ICD-10-CM | POA: Insufficient documentation

## 2019-01-05 DIAGNOSIS — Z3A38 38 weeks gestation of pregnancy: Secondary | ICD-10-CM | POA: Insufficient documentation

## 2019-01-05 DIAGNOSIS — Z0371 Encounter for suspected problem with amniotic cavity and membrane ruled out: Secondary | ICD-10-CM | POA: Diagnosis not present

## 2019-01-05 DIAGNOSIS — O479 False labor, unspecified: Secondary | ICD-10-CM | POA: Diagnosis not present

## 2019-01-05 LAB — POCT FERN TEST: POCT Fern Test: NEGATIVE

## 2019-01-05 NOTE — Discharge Instructions (Signed)

## 2019-01-05 NOTE — MAU Note (Signed)
Pt states she felt a small amount of fluid come out last night around 2200 as well as 1200 and 1500 today, clear watery fluid. She reports contractions that started yesterday that got stronger today around 1300. Reports good fetal movement. Denies any complications with the pregnancy.

## 2019-01-05 NOTE — MAU Provider Note (Signed)
S: Ms. Catherine Haney is a 28 y.o. [phone] at [redacted]w[redacted]d  who presents to MAU today complaining of leaking of fluid since yesterday. She denies vaginal bleeding. She endorses contractions that are not regular. She reports normal fetal movement.    O: BP 117/67 (BP Location: Right Arm)   Pulse 79   Temp 98.4 F (36.9 C) (Oral)   Resp 16   LMP 04/09/2018   SpO2 100%  GENERAL: Well-developed, well-nourished female in no acute distress.  HEAD: Normocephalic, atraumatic.  CHEST: Normal effort of breathing, regular heart rate ABDOMEN: Soft, nontender, gravid PELVIC: Normal external female genitalia. Vagina is pink and rugated. Cervix with normal contour, no lesions. Normal discharge.  No pooling.   Cervical exam:  Dilation: 1 Effacement (%): Thick Station: -2 Presentation: Vertex Exam by:: Dr. Darene Lamer   Fetal Monitoring: Baseline: 140 Variability: moderate Accelerations: present Decelerations: none Contractions: occasionally irregular contractions and some uterine irritability noted  No ferning noted.  A: SIUP at [redacted]w[redacted]d  Membranes intact  P: DC home  Encouraged more PO fluid intake Follow up in the office as scheduled Signs and symptoms of labor discussed, with instructions to return to MAU for any concerns.  Merilyn Baba, DO 01/05/2019 6:39 PM

## 2019-01-07 ENCOUNTER — Other Ambulatory Visit: Payer: Self-pay

## 2019-01-07 ENCOUNTER — Encounter: Payer: Self-pay | Admitting: Obstetrics & Gynecology

## 2019-01-07 ENCOUNTER — Ambulatory Visit (INDEPENDENT_AMBULATORY_CARE_PROVIDER_SITE_OTHER): Payer: Medicaid Other | Admitting: Obstetrics & Gynecology

## 2019-01-07 VITALS — BP 118/76 | HR 84 | Wt 136.0 lb

## 2019-01-07 DIAGNOSIS — Z1389 Encounter for screening for other disorder: Secondary | ICD-10-CM

## 2019-01-07 DIAGNOSIS — Z3483 Encounter for supervision of other normal pregnancy, third trimester: Secondary | ICD-10-CM

## 2019-01-07 DIAGNOSIS — Z331 Pregnant state, incidental: Secondary | ICD-10-CM

## 2019-01-07 DIAGNOSIS — Z3A39 39 weeks gestation of pregnancy: Secondary | ICD-10-CM

## 2019-01-07 LAB — POCT URINALYSIS DIPSTICK OB
Blood, UA: NEGATIVE
Glucose, UA: NEGATIVE
Ketones, UA: NEGATIVE
Nitrite, UA: NEGATIVE
POC,PROTEIN,UA: NEGATIVE

## 2019-01-07 NOTE — Progress Notes (Signed)
   LOW-RISK PREGNANCY VISIT Patient name: Catherine Haney MRN [id number]  Date of birth: Mar 15, 1991 Chief Complaint:   Routine Prenatal Visit  History of Present Illness:   Catherine Haney is a 28 y.o. [id number] female at [redacted]w[redacted]d with an Estimated Date of Delivery: 01/14/19 being seen today for ongoing management of a low-risk pregnancy.  Today she reports no complaints. Contractions: Irregular. Vag. Bleeding: None.  Movement: Present. denies leaking of fluid. Review of Systems:   Pertinent items are noted in HPI Denies abnormal vaginal discharge w/ itching/odor/irritation, headaches, visual changes, shortness of breath, chest pain, abdominal pain, severe nausea/vomiting, or problems with urination or bowel movements unless otherwise stated above. Pertinent History Reviewed:  Reviewed past medical,surgical, social, obstetrical and family history.  Reviewed problem list, medications and allergies. Physical Assessment:   Vitals:   01/07/19 1209  BP: 118/76  Pulse: 84  Weight: 136 lb (61.7 kg)  Body mass index is 24.87 kg/m.        Physical Examination:   General appearance: Well appearing, and in no distress  Mental status: Alert, oriented to person, place, and time  Skin: Warm & dry  Cardiovascular: Normal heart rate noted  Respiratory: Normal respiratory effort, no distress  Abdomen: Soft, gravid, nontender  Pelvic: Cervical exam performed  Dilation: Fingertip Effacement (%): Thick Station: -3  Extremities: Edema: None  Fetal Status: Fetal Heart Rate (bpm): 160 Fundal Height: 36 cm Movement: Present Presentation: Vertex  Results for orders placed or performed in visit on 01/07/19 (from the past 24 hour(s))  POC Urinalysis Dipstick OB   Collection Time: 01/07/19 12:10 PM  Result Value Ref Range   Color, UA     Clarity, UA     Glucose, UA Negative Negative   Bilirubin, UA     Ketones, UA neg    Spec Grav, UA     Blood, UA neg    pH, UA     POC,PROTEIN,UA Negative Negative, Trace,  Small (1+), Moderate (2+), Large (3+), 4+   Urobilinogen, UA     Nitrite, UA neg    Leukocytes, UA Trace (A) Negative   Appearance     Odor      Assessment & Plan:  1) Low-risk pregnancy [id number] at [redacted]w[redacted]d with an Estimated Date of Delivery: 01/14/19   2)    Meds: No orders of the defined types were placed in this encounter.  Labs/procedures today:   Plan:  Continue routine obstetrical care   Reviewed: Term labor symptoms and general obstetric precautions including but not limited to vaginal bleeding, contractions, leaking of fluid and fetal movement were reviewed in detail with the patient.  All questions were answered.  home bp cuff. Rx faxed to . Check bp weekly, let us know if >140/90.   Follow-up: Return in about 1 week (around 01/14/2019) for LROB, NST.  Orders Placed This Encounter  Procedures  . POC Urinalysis Dipstick OB   Florian Buff  01/07/2019 12:22 PM

## 2019-01-11 ENCOUNTER — Other Ambulatory Visit: Payer: Self-pay

## 2019-01-11 ENCOUNTER — Inpatient Hospital Stay (HOSPITAL_COMMUNITY)
Admission: AD | Admit: 2019-01-11 | Discharge: 2019-01-11 | Disposition: A | Discharge status: Home / Self Care | Payer: Medicaid Other | Source: Ambulatory Visit | Attending: Obstetrics and Gynecology | Admitting: Obstetrics and Gynecology

## 2019-01-11 ENCOUNTER — Encounter (HOSPITAL_COMMUNITY): Payer: Self-pay | Admitting: *Deleted

## 2019-01-11 DIAGNOSIS — O26853 Spotting complicating pregnancy, third trimester: Secondary | ICD-10-CM | POA: Insufficient documentation

## 2019-01-11 DIAGNOSIS — R109 Unspecified abdominal pain: Secondary | ICD-10-CM | POA: Diagnosis not present

## 2019-01-11 DIAGNOSIS — M549 Dorsalgia, unspecified: Secondary | ICD-10-CM | POA: Diagnosis not present

## 2019-01-11 DIAGNOSIS — O471 False labor at or after 37 completed weeks of gestation: Secondary | ICD-10-CM | POA: Diagnosis not present

## 2019-01-11 DIAGNOSIS — O26893 Other specified pregnancy related conditions, third trimester: Secondary | ICD-10-CM | POA: Insufficient documentation

## 2019-01-11 DIAGNOSIS — Z3A39 39 weeks gestation of pregnancy: Secondary | ICD-10-CM | POA: Insufficient documentation

## 2019-01-11 DIAGNOSIS — O479 False labor, unspecified: Secondary | ICD-10-CM

## 2019-01-11 NOTE — MAU Note (Signed)
Pt reports to MAU c/o abdominal pain and back pain that comes and goes and has progressively gotten worse today. +FM. No LOF. Pt reports some bleeding in the afternoon when she wiped pt did not have to wear a pad.

## 2019-01-13 ENCOUNTER — Inpatient Hospital Stay (HOSPITAL_COMMUNITY): Payer: Medicaid Other | Admitting: Anesthesiology

## 2019-01-13 ENCOUNTER — Inpatient Hospital Stay (HOSPITAL_COMMUNITY)
Admission: AD | Admit: 2019-01-13 | Discharge: 2019-01-15 | DRG: 807 | Disposition: A | Discharge status: Home / Self Care | Payer: Medicaid Other | Attending: Obstetrics & Gynecology | Admitting: Obstetrics & Gynecology

## 2019-01-13 ENCOUNTER — Encounter (HOSPITAL_COMMUNITY): Payer: Self-pay | Admitting: *Deleted

## 2019-01-13 ENCOUNTER — Other Ambulatory Visit: Payer: Self-pay

## 2019-01-13 DIAGNOSIS — Z3483 Encounter for supervision of other normal pregnancy, third trimester: Secondary | ICD-10-CM

## 2019-01-13 DIAGNOSIS — O4290 Premature rupture of membranes, unspecified as to length of time between rupture and onset of labor, unspecified weeks of gestation: Secondary | ICD-10-CM | POA: Diagnosis present

## 2019-01-13 DIAGNOSIS — Z3A39 39 weeks gestation of pregnancy: Secondary | ICD-10-CM

## 2019-01-13 DIAGNOSIS — O4202 Full-term premature rupture of membranes, onset of labor within 24 hours of rupture: Secondary | ICD-10-CM

## 2019-01-13 DIAGNOSIS — Z20828 Contact with and (suspected) exposure to other viral communicable diseases: Secondary | ICD-10-CM | POA: Diagnosis present

## 2019-01-13 DIAGNOSIS — O4292 Full-term premature rupture of membranes, unspecified as to length of time between rupture and onset of labor: Principal | ICD-10-CM | POA: Diagnosis present

## 2019-01-13 HISTORY — DX: Premature rupture of membranes, unspecified as to length of time between rupture and onset of labor, unspecified weeks of gestation: O42.90

## 2019-01-13 LAB — CBC
HCT: 33.4 % — ABNORMAL LOW (ref 36.0–46.0)
Hemoglobin: 11.2 g/dL — ABNORMAL LOW (ref 12.0–15.0)
MCH: 29.7 pg (ref 26.0–34.0)
MCHC: 33.5 g/dL (ref 30.0–36.0)
MCV: 88.6 fL (ref 80.0–100.0)
Platelets: 260 10*3/uL (ref 150–400)
RBC: 3.77 MIL/uL — ABNORMAL LOW (ref 3.87–5.11)
RDW: 12.9 % (ref 11.5–15.5)
WBC: 7.1 10*3/uL (ref 4.0–10.5)
nRBC: 0 % (ref 0.0–0.2)

## 2019-01-13 LAB — TYPE AND SCREEN
ABO/RH(D): O POS
Antibody Screen: NEGATIVE

## 2019-01-13 LAB — SARS CORONAVIRUS 2 (TAT 6-24 HRS): SARS Coronavirus 2: NEGATIVE

## 2019-01-13 LAB — POCT FERN TEST: POCT Fern Test: POSITIVE

## 2019-01-13 LAB — ABO/RH: ABO/RH(D): O POS

## 2019-01-13 MED ORDER — PRENATAL MULTIVITAMIN CH
1.0000 | ORAL_TABLET | Freq: Every day | ORAL | Status: DC
  Administered 2019-01-14 – 2019-01-15 (×2): 1 via ORAL
  Filled 2019-01-13 (×2): qty 1

## 2019-01-13 MED ORDER — FLEET ENEMA 7-19 GM/118ML RE ENEM: 1.0000 | ENEMA | RECTAL | Status: DC | PRN

## 2019-01-13 MED ORDER — ONDANSETRON HCL 4 MG/2ML IJ SOLN: 4.0000 mg | INTRAMUSCULAR | Status: DC | PRN

## 2019-01-13 MED ORDER — PHENYLEPHRINE 40 MCG/ML (10ML) SYRINGE FOR IV PUSH (FOR BLOOD PRESSURE SUPPORT): 80.0000 ug | PREFILLED_SYRINGE | INTRAVENOUS | Status: DC | PRN

## 2019-01-13 MED ORDER — ACETAMINOPHEN 325 MG PO TABS
650.0000 mg | ORAL_TABLET | ORAL | Status: DC | PRN
  Administered 2019-01-13: 650 mg via ORAL
  Filled 2019-01-13: qty 2

## 2019-01-13 MED ORDER — ZOLPIDEM TARTRATE 5 MG PO TABS: 5.0000 mg | ORAL_TABLET | Freq: Every evening | ORAL | Status: DC | PRN

## 2019-01-13 MED ORDER — DIPHENHYDRAMINE HCL 50 MG/ML IJ SOLN: 12.5000 mg | INTRAMUSCULAR | Status: DC | PRN

## 2019-01-13 MED ORDER — SENNOSIDES-DOCUSATE SODIUM 8.6-50 MG PO TABS
2.0000 | ORAL_TABLET | ORAL | Status: DC
  Administered 2019-01-14: 2 via ORAL
  Filled 2019-01-13: qty 2

## 2019-01-13 MED ORDER — FENTANYL-BUPIVACAINE-NACL 0.5-0.125-0.9 MG/250ML-% EP SOLN
12.0000 mL/h | EPIDURAL | Status: DC | PRN
  Filled 2019-01-13: qty 250

## 2019-01-13 MED ORDER — OXYCODONE-ACETAMINOPHEN 5-325 MG PO TABS: 1.0000 | ORAL_TABLET | ORAL | Status: DC | PRN

## 2019-01-13 MED ORDER — SOD CITRATE-CITRIC ACID 500-334 MG/5ML PO SOLN: 30.0000 mL | ORAL | Status: DC | PRN

## 2019-01-13 MED ORDER — LIDOCAINE HCL (PF) 1 % IJ SOLN
INTRAMUSCULAR | Status: DC | PRN
  Administered 2019-01-13: 10 mL via EPIDURAL

## 2019-01-13 MED ORDER — EPHEDRINE 5 MG/ML INJ: 10.0000 mg | INTRAVENOUS | Status: DC | PRN

## 2019-01-13 MED ORDER — LIDOCAINE HCL (PF) 1 % IJ SOLN
30.0000 mL | INTRAMUSCULAR | Status: AC | PRN
  Administered 2019-01-13: 30 mL via SUBCUTANEOUS
  Filled 2019-01-13: qty 30

## 2019-01-13 MED ORDER — ACETAMINOPHEN 325 MG PO TABS
650.0000 mg | ORAL_TABLET | ORAL | Status: DC | PRN
  Administered 2019-01-14 (×2): 650 mg via ORAL
  Filled 2019-01-13 (×3): qty 2

## 2019-01-13 MED ORDER — SIMETHICONE 80 MG PO CHEW: 80.0000 mg | CHEWABLE_TABLET | ORAL | Status: DC | PRN

## 2019-01-13 MED ORDER — COCONUT OIL OIL: 1.0000 "application " | TOPICAL_OIL | Status: DC | PRN

## 2019-01-13 MED ORDER — ONDANSETRON HCL 4 MG PO TABS: 4.0000 mg | ORAL_TABLET | ORAL | Status: DC | PRN

## 2019-01-13 MED ORDER — WITCH HAZEL-GLYCERIN EX PADS: 1.0000 "application " | MEDICATED_PAD | CUTANEOUS | Status: DC | PRN

## 2019-01-13 MED ORDER — LACTATED RINGERS IV SOLN: 500.0000 mL | INTRAVENOUS | Status: DC | PRN

## 2019-01-13 MED ORDER — IBUPROFEN 600 MG PO TABS
600.0000 mg | ORAL_TABLET | Freq: Four times a day (QID) | ORAL | Status: DC
  Administered 2019-01-14 – 2019-01-15 (×6): 600 mg via ORAL
  Filled 2019-01-13 (×6): qty 1

## 2019-01-13 MED ORDER — OXYTOCIN 40 UNITS IN NORMAL SALINE INFUSION - SIMPLE MED: 2.5000 [IU]/h | INTRAVENOUS | Status: DC

## 2019-01-13 MED ORDER — OXYTOCIN 40 UNITS IN NORMAL SALINE INFUSION - SIMPLE MED
1.0000 m[IU]/min | INTRAVENOUS | Status: DC
  Administered 2019-01-13: 2 m[IU]/min via INTRAVENOUS
  Filled 2019-01-13: qty 1000

## 2019-01-13 MED ORDER — BENZOCAINE-MENTHOL 20-0.5 % EX AERO: 1.0000 "application " | INHALATION_SPRAY | CUTANEOUS | Status: DC | PRN

## 2019-01-13 MED ORDER — SODIUM CHLORIDE (PF) 0.9 % IJ SOLN
INTRAMUSCULAR | Status: DC | PRN
  Administered 2019-01-13: 12 mL/h via EPIDURAL

## 2019-01-13 MED ORDER — TERBUTALINE SULFATE 1 MG/ML IJ SOLN: 0.2500 mg | Freq: Once | INTRAMUSCULAR | Status: DC | PRN

## 2019-01-13 MED ORDER — OXYCODONE-ACETAMINOPHEN 5-325 MG PO TABS: 2.0000 | ORAL_TABLET | ORAL | Status: DC | PRN

## 2019-01-13 MED ORDER — LACTATED RINGERS IV SOLN
500.0000 mL | Freq: Once | INTRAVENOUS | Status: AC
  Administered 2019-01-13: 500 mL via INTRAVENOUS

## 2019-01-13 MED ORDER — ONDANSETRON HCL 4 MG/2ML IJ SOLN: 4.0000 mg | Freq: Four times a day (QID) | INTRAMUSCULAR | Status: DC | PRN

## 2019-01-13 MED ORDER — DIBUCAINE (PERIANAL) 1 % EX OINT: 1.0000 "application " | TOPICAL_OINTMENT | CUTANEOUS | Status: DC | PRN

## 2019-01-13 MED ORDER — LACTATED RINGERS IV SOLN
INTRAVENOUS | Status: DC
  Administered 2019-01-13: 16:00:00 via INTRAVENOUS

## 2019-01-13 MED ORDER — FERROUS SULFATE 325 (65 FE) MG PO TABS
325.0000 mg | ORAL_TABLET | Freq: Two times a day (BID) | ORAL | Status: DC
  Administered 2019-01-14 – 2019-01-15 (×3): 325 mg via ORAL
  Filled 2019-01-13 (×3): qty 1

## 2019-01-13 MED ORDER — DIPHENHYDRAMINE HCL 25 MG PO CAPS: 25.0000 mg | ORAL_CAPSULE | Freq: Four times a day (QID) | ORAL | Status: DC | PRN

## 2019-01-13 MED ORDER — OXYTOCIN BOLUS FROM INFUSION
500.0000 mL | Freq: Once | INTRAVENOUS | Status: AC
  Administered 2019-01-13: 500 mL via INTRAVENOUS

## 2019-01-13 MED ORDER — PHENYLEPHRINE 40 MCG/ML (10ML) SYRINGE FOR IV PUSH (FOR BLOOD PRESSURE SUPPORT)
80.0000 ug | PREFILLED_SYRINGE | INTRAVENOUS | Status: DC | PRN
  Filled 2019-01-13: qty 10

## 2019-01-13 NOTE — Anesthesia Preprocedure Evaluation (Signed)
Anesthesia Evaluation  Patient identified by MRN, date of birth, ID band Patient awake    Reviewed: Allergy & Precautions, H&P , NPO status , Patient's Chart, lab work & pertinent test results  History of Anesthesia Complications Negative for: history of anesthetic complications  Airway Mallampati: II  TM Distance: >3 FB Neck ROM: full    Dental no notable dental hx.    Pulmonary neg pulmonary ROS,    Pulmonary exam normal        Cardiovascular negative cardio ROS Normal cardiovascular exam Rhythm:regular Rate:Normal     Neuro/Psych negative neurological ROS  negative psych ROS   GI/Hepatic Neg liver ROS, GERD  ,  Endo/Other  negative endocrine ROS  Renal/GU negative Renal ROS  negative genitourinary   Musculoskeletal   Abdominal   Peds  Hematology negative hematology ROS (+)   Anesthesia Other Findings plt 260  Reproductive/Obstetrics (+) Pregnancy                             Anesthesia Physical Anesthesia Plan  ASA: II  Anesthesia Plan: Epidural   Post-op Pain Management:    Induction:   PONV Risk Score and Plan:   Airway Management Planned:   Additional Equipment:   Intra-op Plan:   Post-operative Plan:   Informed Consent: I have reviewed the patients History and Physical, chart, labs and discussed the procedure including the risks, benefits and alternatives for the proposed anesthesia with the patient or authorized representative who has indicated his/her understanding and acceptance.       Plan Discussed with:   Anesthesia Plan Comments:         Anesthesia Quick Evaluation

## 2019-01-13 NOTE — MAU Note (Signed)
covid swab collected

## 2019-01-13 NOTE — MAU Note (Signed)
Catherine Haney is a 28 y.o. at [redacted]w[redacted]d here in MAU reporting: LOF since 1230, states she had a big gush. It is clear. No pain, no bleeding. +FM  Onset of complaint: today at 1230  Pain score: 0/10  Vitals:   01/13/19 1357  BP: 111/63  Pulse: 86  Resp: 16  Temp: 98.3 F (36.8 C)  SpO2: 100%     FHT: +FM  Lab orders placed from triage: none

## 2019-01-13 NOTE — Progress Notes (Signed)
1435: Vertex presentation verified via bedside ultrasound by Maryelizabeth Kaufmann, CNM.

## 2019-01-13 NOTE — Anesthesia Procedure Notes (Signed)
Epidural Patient location during procedure: OB Start time: 01/13/2019 4:01 PM End time: 01/13/2019 4:15 PM  Staffing Anesthesiologist: Lidia Collum, MD Performed: anesthesiologist   Preanesthetic Checklist Completed: patient identified, pre-op evaluation, timeout performed, IV checked, risks and benefits discussed and monitors and equipment checked  Epidural Patient position: sitting Prep: DuraPrep Patient monitoring: heart rate, continuous pulse ox and blood pressure Approach: midline Location: L3-L4 Injection technique: LOR air  Needle:  Needle type: Tuohy  Needle gauge: 17 G Needle length: 9 cm Needle insertion depth: 4 cm Catheter type: closed end flexible Catheter size: 19 Gauge Catheter at skin depth: 9 cm Test dose: negative  Assessment Events: blood not aspirated, injection not painful, no injection resistance, negative IV test and no paresthesia  Additional Notes Reason for block:procedure for pain

## 2019-01-13 NOTE — H&P (Addendum)
Catherine Haney is a 28 y.o. female presenting for rupture of membranes with a "big gush" at 12:30pm today. She denies abdominal pain, vaginal bleeding, decreased fetal movement. She plans to "try to breastfeed" but is concerned about her history of difficult breastfeeding with her first child.  Prenatal History --Hx Term SVD x 2 --Dating by LMP c/w 10 week [place] --NIPS declined --Negative sickle cell screen --Rubella Immune  OB History    Gravida  4   Para  2   Term  2   Preterm      AB  1   Living  2     SAB  1   TAB      Ectopic      Multiple      Live Births  2          Past Medical History:  Diagnosis Date  . GERD (gastroesophageal reflux disease)    Past Surgical History:  Procedure Laterality Date  . DILATION AND CURETTAGE OF UTERUS N/A 08/25/2017   Procedure: SUCTION DILATATION AND CURETTAGE;  Surgeon: Florian Buff, MD;  Location: [facility];  Service: Gynecology;  Laterality: N/A;   Family History: family history includes Diabetes in her father and mother; Hypertension in her mother and paternal grandfather. Social History:  reports that she has never smoked. She has never used smokeless tobacco. She reports that she does not drink alcohol or use drugs.     Maternal Diabetes: No Genetic Screening: Declined Maternal Ultrasounds/Referrals: Normal Fetal Ultrasounds or other Referrals:  None Maternal Substance Abuse:  No Significant Maternal Medications:  None Significant Maternal Lab Results:  Group B Strep negative Other Comments:  None  Review of Systems  Constitutional: Negative for chills and fever.  Respiratory: Negative for shortness of breath.   Cardiovascular: Negative for chest pain.  Gastrointestinal: Negative for abdominal pain.  Musculoskeletal: Negative for back pain.     Blood pressure 111/63, pulse 86, temperature 98.3 F (36.8 C), temperature source Oral, resp. rate 16, last menstrual period 04/09/2018, SpO2 100 %.  Physical Exam   Nursing note and vitals reviewed. Constitutional: She is oriented to person, place, and time. She appears well-developed and well-nourished.  Cardiovascular: Normal rate.  Respiratory: Effort normal.  GI: She exhibits no distension. There is no abdominal tenderness. There is no rebound and no guarding.  Gravid  Neurological: She is alert and oriented to person, place, and time.  Skin: Skin is warm and dry.  Psychiatric: She has a normal mood and affect. Her behavior is normal. Judgment and thought content normal.    Prenatal labs: ABO, Rh: O/Positive/-- (02/18 1044) Antibody: Negative (05/27 0929) Rubella: 12.20 (02/18 1044) RPR: Non Reactive (05/27 0929)  HBsAg: Negative (02/18 1044)  HIV: Non Reactive (05/27 0929)  GBS: Negative (07/31 0000)   Fetal Surveillance --Category I tracing --Baseline 145, moderate variability, positive accels, no decels --Toco: irregular mild ctx q 2-6 min --Vertex confirmed with bedside ultrasound  Assessment/Plan: --28 y.o. F7P1025 at [redacted]w[redacted]d  --Grossly ruptured at 12:30pm today --Category I tracing --GBS NEG --Boy/both/Liletta --Desires epidural --Admit to L&D, report called to C. Maryruth Hancock, CNM by MAU charge RN  Language barrier: Arabic interpreter used for all patient interaction  Darlina Rumpf, [place] 01/13/2019, 2:54 PM  Patient requesting Pitocin augmentation for labor. Desires epidural placement before starting. Orders placed and cervix checked. 3/80/-3.  Wende Mott, CNM 01/13/19 3:43 PM

## 2019-01-13 NOTE — Discharge Summary (Signed)
Postpartum Discharge Summary     Patient Name: Catherine Haney DOB: 07-Apr-1991 MRN: [id number]  Date of admission: 01/13/2019 Delivering Provider: Laury Deep   Date of discharge: 01/15/2019  Admitting diagnosis: [place] Intrauterine pregnancy: [redacted]w[redacted]d     Secondary diagnosis:  Active Problems:   PROM (premature rupture of membranes)  Additional problems: none     Discharge diagnosis: Term Pregnancy Delivered                                                                                                Post partum procedures:  Augmentation: Pitocin  Complications: None  Hospital course:  Onset of Labor With Vaginal Delivery     28 y.o. yo [id number] at [redacted]w[redacted]d was admitted in Latent Labor on 01/13/2019. Patient had an uncomplicated labor course as follows:  Membrane Rupture Time/Date: 12:00 PM ,01/13/2019   Intrapartum Procedures: Episiotomy: None [1]                                         Lacerations:  None [1]  Patient had a delivery of a Viable infant. 01/13/2019  Information for the patient's newborn:  Demika, Langenderfer [[id number]]  Delivery Method: Vaginal, Spontaneous(Filed from Delivery Summary)     Pateint had an uncomplicated postpartum course.  She is ambulating, tolerating a regular diet, passing flatus, and urinating well. Patient is discharged home in stable condition on 01/15/19.   Magnesium Sulfate received: No BMZ received: No  Physical exam  Vitals:   01/14/19 1206 01/14/19 1256 01/14/19 2200 01/15/19 0553  BP: 111/68 110/71 115/75 108/67  Pulse: 66 66 63 69  Resp: 16 17 16 16   Temp: 98.3 F (36.8 C) 98.5 F (36.9 C) 98 F (36.7 C) 98.2 F (36.8 C)  TempSrc: Oral Oral Oral Oral  SpO2: 100%  100% 99%  Weight:      Height:       General: alert, cooperative and no distress Lochia: appropriate Uterine Fundus: firm Incision: N/A DVT Evaluation: No evidence of DVT seen on physical exam. Labs: Lab Results  Component Value Date   WBC 7.1  01/13/2019   HGB 11.2 (L) 01/13/2019   HCT 33.4 (L) 01/13/2019   MCV 88.6 01/13/2019   PLT 260 01/13/2019   CMP Latest Ref Rng & Units 10/30/2018  Glucose 70 - 99 mg/dL 94  BUN 6 - 20 mg/dL 9  Creatinine 0.44 - 1.00 mg/dL 0.32(L)  Sodium 135 - 145 mmol/L 137  Potassium 3.5 - 5.1 mmol/L 3.8  Chloride 98 - 111 mmol/L 106  CO2 22 - 32 mmol/L 21(L)  Calcium 8.9 - 10.3 mg/dL 8.5(L)  Total Protein 6.0 - 8.5 g/dL -  Total Bilirubin 0.0 - 1.2 mg/dL -  Alkaline Phos 39 - 117 IU/L -  AST 0 - 40 IU/L -  ALT 0 - 32 IU/L -    Discharge instruction: per After Visit Summary and "Baby and Me Booklet".  After visit meds:  Allergies as of 01/15/2019   No  Known Allergies     Medication List    STOP taking these medications   PrePLUS 27-1 MG Tabs   SLOW FE PO     TAKE these medications   ibuprofen 600 MG tablet Commonly known as: ADVIL Take 1 tablet (600 mg total) by mouth every 6 (six) hours.       Diet: No evidence of DVT seen on physical exam.  Activity: Advance as tolerated. Pelvic rest for 6 weeks.   Outpatient follow up: Follow up Appt: Future Appointments  Date Time Provider Department Center  02/18/2019  8:50 AM Cheral MarkerBooker, Kimberly R, CNM [facility]   Follow up Visit: Follow-up Information    [facility] Follow up on 02/18/2019.   Specialty: Obstetrics and Gynecology Why: for postpartum checkup Contact information: [address] [place] [id number] [phone]           Please schedule this patient for Postpartum visit in: 6 weeks with the following provider: [facility] For C/S patients schedule nurse incision check in weeks 2 weeks: no Low risk pregnancy complicated by: none Delivery mode:  SVD Anticipated Birth Control:  IUD PP Procedures needed: IUD insertion  Schedule Integrated BH visit: no   Newborn Data: Live born female  Birth Weight:   APGAR: 9, 9  Newborn Delivery   Birth date/time: 01/13/2019 21:18:00 Delivery  type: Vaginal, Spontaneous      Baby Feeding: Breast Disposition:home with mother   01/15/2019 Jacklyn ShellFrances Cresenzo-Dishmon, CNM

## 2019-01-14 ENCOUNTER — Other Ambulatory Visit: Payer: Medicaid Other | Admitting: Obstetrics and Gynecology

## 2019-01-14 NOTE — Anesthesia Postprocedure Evaluation (Signed)
Anesthesia Post Note  Patient: Catherine Haney  Procedure(s) Performed: AN AD HOC LABOR EPIDURAL     Patient location during evaluation: Mother Baby Anesthesia Type: Epidural Level of consciousness: awake, awake and alert and oriented Pain management: pain level controlled Vital Signs Assessment: post-procedure vital signs reviewed and stable Respiratory status: spontaneous breathing, nonlabored ventilation and respiratory function stable Cardiovascular status: stable Postop Assessment: no headache, no backache, patient able to bend at knees, no apparent nausea or vomiting, adequate PO intake and able to ambulate Anesthetic complications: no    Last Vitals:  Vitals:   01/14/19 0410 01/14/19 0820  BP: 115/65 111/76  Pulse: 75 73  Resp: 18 16  Temp: 36.5 C 36.9 C  SpO2: 100% 100%    Last Pain:  Vitals:   01/14/19 0820  TempSrc: Oral  PainSc:    Pain Goal:                   Maksym Pfiffner

## 2019-01-14 NOTE — Progress Notes (Addendum)
POSTPARTUM PROGRESS NOTE  Post Partum Day 1  Video translator was used during this encounter.  Subjective:  Catherine Haney is a 28 y.o. [id number] s/p SVD at [redacted]w[redacted]d.  She reports she is doing well. No acute events overnight. She denies any problems with ambulating, voiding or po intake. Denies nausea or vomiting.  Pain is moderately controlled, still feeling lower abdominal pain.  Lochia is appropriate.  Objective: Blood pressure 115/65, pulse 75, temperature 97.7 F (36.5 C), temperature source Oral, resp. rate 18, height 5\' 2"  (1.575 m), weight 62.1 kg, last menstrual period 04/09/2018, SpO2 100 %, unknown if currently breastfeeding.  Physical Exam:  General: alert, cooperative and no distress Chest: no respiratory distress. CTAB Heart:regular rate, distal pulses intact Abdomen: soft, tender to palpation across lower abdomen. Uterine Fundus: firm, appropriately tender DVT Evaluation: No calf swelling or tenderness Extremities: No edema Skin: warm, dry  Recent Labs    01/13/19 1449  HGB 11.2*  HCT 33.4*    Assessment/Plan: Catherine Haney is a 28 y.o. [id number] s/p SVD at [redacted]w[redacted]d   PPD#1 - Doing well  Routine postpartum care Contraception: IUD Feeding: breast Dispo: Plan for discharge 8/25.   LOS: 1 day   [place] Student 01/14/2019, 7:30 AM  I saw and evaluated the patient. I agree with the findings and the plan of care as documented in the resident's note. Use of Arabic interpreter # [id number]. Patient denies complaints. Vitals stable. Minimal bleeding. Plan for DC tomorrow. Would like IUD placed at post-partum visit.   Barrington Ellison, MD [facility] Fellow, [facility] for [employer], [place]

## 2019-01-14 NOTE — Lactation Note (Signed)
This note was copied from a baby's chart. Lactation Consultation Note [employer] Interpreter Arabic [phone] [place] used for consult. Baby 7 hrs old. Mom hasn't put baby to the breast yet. Has formula fed. Mom has 2 other children 8 and 29 yrs old that she exclusively pumped and bottle fed for 1 month each. Mom has inverted nipples. Breast are filling, knots, tender. Transitional milk pours out when breast touched.  Breast massaged and expressed milk. Mom has a lot of milk. Hand pump given to pre-pump prior to application of NS. Took #24, 20 NS. Demonstrated application. Mom in a lot of cramping pain. Baby had formula fed 20 minutes prior to [place] coming to rm. Gave shells, strongly encouraged mom to wear in this am. Hand expressed and breast massaged 10 ml from each breast. (total 20 ml) Explained to give BM before the formula and do not mix. Mom states understanding. DEBP taken to rm. As full as mom's breast are she will probably need to pump. Mom in a lot of pain. Encouraged to rest. Baby sleeping soundly. When baby is ready to BF, will try to latch w.NS. Patient Name: Catherine Haney [id number] Date: 01/14/2019 Reason for consult: Follow-up assessment;Term   Maternal Data Has patient been taught Hand Expression?: Yes Does the patient have breastfeeding experience prior to this delivery?: Yes  Feeding Feeding Type: Formula Nipple Type: Slow - flow  LATCH Score       Type of Nipple: Inverted  Comfort (Breast/Nipple): Filling, red/small blisters or bruises, mild/mod discomfort(breast filling/knots)        Interventions Interventions: Support pillows;Breast compression;Hand pump;Pre-pump if needed;Shells;Hand express;Expressed milk;Breast massage  Lactation Tools Discussed/Used Tools: Shells;Pump;Nipple Shields Nipple shield size: 20;24 Shell Type: Inverted Breast pump type: Double-Electric Breast Pump;Manual WIC Program: No Pump Review: Setup, frequency, and cleaning;Milk  Storage(RN to set up DEBP please) Initiated by:: Allayne Stack RN IBCLC Date initiated:: 01/14/19   Consult Status Consult Status: Follow-up Date: 01/14/19 Follow-up type: In-patient    Adorian Gwynne, [place] 01/14/2019, 5:02 AM

## 2019-01-15 LAB — RPR, QUANT+TP ABS (REFLEX)
Rapid Plasma Reagin, Quant: 1:1 {titer} — ABNORMAL HIGH
T Pallidum Abs: NONREACTIVE

## 2019-01-15 LAB — RPR: RPR Ser Ql: REACTIVE — AB

## 2019-01-15 MED ORDER — IBUPROFEN 600 MG PO TABS: 600.0000 mg | ORAL_TABLET | Freq: Four times a day (QID) | ORAL | 0 refills | Status: DC

## 2019-01-15 NOTE — Lactation Note (Addendum)
This note was copied from a baby's chart. Lactation Consultation Note:  Arabic Interpreter [place]  ID # [id number] for all teaching.  Mother reports wanting to breastfeed and formula feed.  Mother reports that her breast and nipple are hurting. Mother's breast are full.  Assist mother with pumping both breast.  Mother complaints of nipple tenderness. Nipple's are pink and tender. Mother has a bifurcated nipple on the left nipple.  Infant placed on the left breast. Infant tugging and pulled nipple out for a few mins.  Mother has a recessed chin and a high palate. Mother was fit with a #24 NS. Infant on and off for 10-15 mins.  Infant placed in football hold ,attempts were made to latch infant without the nipple shield . Infant on and off with a few sucks. Infant latched with on with the nipple shield. Infant was given 5 ml ebm with a curved tip syringe through the nipple shield. Infant sustained latch for 10-15 mins. Mother taught to do breast compression.   Mother has the harmony hand pump . She was advised to breastfeed, post pump for 15 mins on each breast. Discussed treatment and prevention of engorgement.  Discussed cue base feeding. Advised to feed infant at least 8-12 or more times in 24 hours.  Mother was informed of available pump rental at the [facility] store. She also ask where to buy a pump. Mother advised to buy a good pump if she plans to pump a lot. Mother reports that she is now active with [facility] or eligible for wic.   Mother was informed of available services. She has [place] brochure .    Patient Name: Catherine Haney [id number] Date: 01/15/2019 Reason for consult: Follow-up assessment   Maternal Data    Feeding Feeding Type: Breast Milk  LATCH Score                   Interventions    Lactation Tools Discussed/Used     Consult Status      Darla Lesches 01/15/2019, 1:14 PM

## 2019-01-15 NOTE — Discharge Instructions (Signed)

## 2019-02-01 ENCOUNTER — Other Ambulatory Visit: Payer: Self-pay | Admitting: Family Medicine

## 2019-02-01 MED ORDER — NORETHINDRONE 0.35 MG PO TABS: 1.0000 | ORAL_TABLET | Freq: Every day | ORAL | 11 refills | Status: DC

## 2019-02-01 NOTE — Progress Notes (Signed)
Sent breastfeeding birth control

## 2019-02-07 ENCOUNTER — Ambulatory Visit (INDEPENDENT_AMBULATORY_CARE_PROVIDER_SITE_OTHER): Payer: Medicaid Other | Admitting: Family Medicine

## 2019-02-07 ENCOUNTER — Encounter: Payer: Self-pay | Admitting: Family Medicine

## 2019-02-07 DIAGNOSIS — J302 Other seasonal allergic rhinitis: Secondary | ICD-10-CM

## 2019-02-07 DIAGNOSIS — H6691 Otitis media, unspecified, right ear: Secondary | ICD-10-CM

## 2019-02-07 MED ORDER — IBUPROFEN 600 MG PO TABS: 600.0000 mg | ORAL_TABLET | Freq: Four times a day (QID) | ORAL | 0 refills | Status: DC | PRN

## 2019-02-07 MED ORDER — AMOXICILLIN 500 MG PO CAPS: 500.0000 mg | ORAL_CAPSULE | Freq: Two times a day (BID) | ORAL | 0 refills | Status: AC

## 2019-02-07 NOTE — Progress Notes (Signed)
Telephone visit  Subjective: CC: ear pain PCP: Dettinger, Fransisca Kaufmann, MD [id number] Catherine Haney is a 28 y.o. female calls for telephone consult today. Patient provides verbal consent for consult held via phone.  Location of patient: home Location of provider: Working remotely from home Others present for call: children  [place] phone interpreter [place], [place] used for Arabic translation of this visit  1. Ear pain Patient reports pain is intermittent in the right ear since 3 months ago. It became constant yesterday. Denies dizziness, drainage, fevers, chills, nausea.  She reports rhinorrhea, some sinus pressure.  She reports she has difficulty swallowing due to pain in her ear but denies overt sore throat.  She is not using any oral antihistamine.  She is using a flonase nasal spray every day.  She is breast feeding.   ROS: Per HPI  No Known Allergies Past Medical History:  Diagnosis Date  . GERD (gastroesophageal reflux disease)     Current Outpatient Medications:  .  ibuprofen (ADVIL) 600 MG tablet, Take 1 tablet (600 mg total) by mouth every 6 (six) hours., Disp: 30 tablet, Rfl: 0 .  norethindrone (JENCYCLA) 0.35 MG tablet, Take 1 tablet (0.35 mg total) by mouth daily., Disp: 1 Package, Rfl: 11  Assessment/ Plan: 28 y.o. female   1. Otitis of right ear Empiric coverage for infection sent w/ amoxicillin given breast feeding status.  I considered topical Ciprodex and low systemic antibiotics however it appears that the transmission of topical to baby through breast-feeding is uncertain.  I think the amoxicillin systemically is of lower risk.  We discussed consideration for antihistamine use as this certainly may be related to eustachian tube drainage defect.  Continue Flonase.  Follow-up if symptoms are worsening or not improving. - amoxicillin (AMOXIL) 500 MG capsule; Take 1 capsule (500 mg total) by mouth 2 (two) times daily for 7 days.  Dispense: 14 capsule; Refill: 0 - ibuprofen  (ADVIL) 600 MG tablet; Take 1 tablet (600 mg total) by mouth every 6 (six) hours as needed for moderate pain.  Dispense: 30 tablet; Refill: 0  2. Other seasonal allergic rhinitis As above   Start time: 8:49am End time: 9:05am  Total time spent on patient care (including telephone call/ virtual visit): 20 minutes  [place], [place] [phone]

## 2019-02-18 ENCOUNTER — Other Ambulatory Visit: Payer: Self-pay

## 2019-02-18 ENCOUNTER — Telehealth (INDEPENDENT_AMBULATORY_CARE_PROVIDER_SITE_OTHER): Payer: Medicaid Other | Admitting: Women's Health

## 2019-02-18 ENCOUNTER — Encounter: Payer: Self-pay | Admitting: Women's Health

## 2019-02-18 MED ORDER — PRENATAL PLUS 27-1 MG PO TABS: 1.0000 | ORAL_TABLET | Freq: Every day | ORAL | 11 refills | Status: DC

## 2019-02-18 NOTE — Progress Notes (Signed)
TELEHEALTH VIRTUAL POSTPARTUM VISIT ENCOUNTER NOTE Patient name: Catherine Haney MRN [id number]  Date of birth: 25-Mar-1991  I connected with patient on 02/18/19 at  8:50 AM EDT by telphone (unable to do mychart/webex) and verified that I am speaking with the correct person using two identifiers. Due to COVID-19 recommendations, pt is not currently in our office.    I discussed the limitations, risks, security and privacy concerns of performing an evaluation and management service by telephone and the availability of in person appointments. I also discussed with the patient that there may be a patient responsible charge related to this service. The patient expressed understanding and agreed to proceed.  Encounter w/ assistance from Arabic interpreter via Education officer, museum Complaint:   Postpartum Care  History of Present Illness:   Catherine Haney is a 28 y.o. [phone] female being evaluated today for a postpartum visit. She is 5 weeks postpartum following a spontaneous vaginal delivery at 39.6 gestational weeks. Anesthesia: epidural. Laceration: 'small perineal' per note, hemostatic. I have fully reviewed the prenatal and intrapartum course. Pregnancy uncomplicated. Postpartum course has been uncomplicated. Bleeding thin lochia. Bowel function is normal. Bladder function is normal.  Patient is not sexually active. Last sexual activity: prior to birth of baby.  Contraception method is wants [place] IUD.  Last pap 10/19 in [place].  Results were normal .  No LMP recorded.  Baby's course has been uncomplicated. Baby is feeding by breast & bottle   Inocente Salles Postpartum Depression Screening: negative, pt denies any problems w/ depression/anxiety Edinburgh Postnatal Depression Scale - 02/18/19 0907      Edinburgh Postnatal Depression Scale:  In the Past 7 Days   I have been able to laugh and see the funny side of things.  3    I have looked forward with enjoyment to things.  3    I have  blamed myself unnecessarily when things went wrong.  0    I have been anxious or worried for no good reason.  3    I have felt scared or panicky for no good reason.  0    Things have been getting on top of me.  0    I have been so unhappy that I have had difficulty sleeping.  1    I have felt sad or miserable.  0    I have been so unhappy that I have been crying.  0    The thought of harming myself has occurred to me.  0    Edinburgh Postnatal Depression Scale Total  10      Review of Systems:   Pertinent items are noted in HPI Denies Abnormal vaginal discharge w/ itching/odor/irritation, headaches, visual changes, shortness of breath, chest pain, abdominal pain, severe nausea/vomiting, or problems with urination or bowel movements. Pertinent History Reviewed:  Reviewed past medical,surgical, obstetrical and family history.  Reviewed problem list, medications and allergies. OB History  Gravida Para Term Preterm AB Living  4 3 3   1 3   SAB TAB Ectopic Multiple Live Births  1     0 3    # Outcome Date GA Lbr Len/2nd Weight Sex Delivery Anes PTL Lv  4 Term 01/13/19 [redacted]w[redacted]d 08:42 / 00:36 7 lb 12.5 oz (3.53 kg) M Vag-Spont EPI  LIV     Birth Comments: WNL   3 SAB 08/2017 [redacted]w[redacted]d            Birth Comments: Missed Ab, CRL [redacted]w[redacted]d, cytotec  2 Term 05/03/13  [redacted]w[redacted]d  9 lb (4.082 kg) M Vag-Spont None N LIV  1 Term 07/23/10 [redacted]w[redacted]d  7 lb (3.175 kg) M Vag-Spont EPI  LIV   Physical Assessment:   Vitals:   02/18/19 0851  BP: 106/73  Pulse: 72  Weight: 120 lb (54.4 kg)  Height: 5\' 2"  (1.575 m)  Body mass index is 21.95 kg/m.       Physical Examination:  General:  Alert, oriented and cooperative.   Mental Status: Normal mood and affect perceived. Normal judgment and thought content.  Rest of physical exam deferred due to type of encounter       No results found for this or any previous visit (from the past 24 hour(s)).  Assessment & Plan:  1) Postpartum exam 2) 5 wks s/p SVB 3) Breast &  bottlefeeding 4) Depression screening 5) Contraception counseling, pt prefers abstinence and IUD  Meds:  Meds ordered this encounter  Medications  . prenatal vitamin w/FE, FA (PRENATAL 1 + 1) 27-1 MG TABS tablet    Sig: Take 1 tablet by mouth daily at 12 noon.    Dispense:  30 tablet    Refill:  11    Order Specific Question:   Supervising Provider    Answer:   Florian Buff [[id number]]    I discussed the assessment and treatment plan with the patient. The patient was provided an opportunity to ask questions and all were answered. The patient agreed with the plan and demonstrated an understanding of the instructions.   The patient was advised to call back or seek an in-person evaluation/go to the ED for any concerning postpartum symptoms.  I provided 15 minutes of non-face-to-face time during this encounter.  Follow-up: Return for 1st available, IUD insertion.   No orders of the defined types were placed in this encounter.   [place], [facility] 02/18/2019 9:36 AM

## 2019-02-28 ENCOUNTER — Encounter: Payer: Self-pay | Admitting: Advanced Practice Midwife

## 2019-02-28 ENCOUNTER — Ambulatory Visit (INDEPENDENT_AMBULATORY_CARE_PROVIDER_SITE_OTHER): Payer: Medicaid Other | Admitting: Advanced Practice Midwife

## 2019-02-28 ENCOUNTER — Other Ambulatory Visit: Payer: Self-pay

## 2019-02-28 VITALS — BP 112/71 | HR 72 | Wt 120.0 lb

## 2019-02-28 DIAGNOSIS — Z3202 Encounter for pregnancy test, result negative: Secondary | ICD-10-CM

## 2019-02-28 DIAGNOSIS — Z30430 Encounter for insertion of intrauterine contraceptive device: Secondary | ICD-10-CM

## 2019-02-28 LAB — POCT URINE PREGNANCY: Preg Test, Ur: NEGATIVE

## 2019-02-28 MED ORDER — LEVONORGESTREL 19.5 MCG/DAY IU IUD
INTRAUTERINE_SYSTEM | Freq: Once | INTRAUTERINE | Status: AC
  Administered 2019-02-28: 12:00:00 via INTRAUTERINE

## 2019-02-28 NOTE — Addendum Note (Signed)
Addended by: Octaviano Glow on: 02/28/2019 12:04 PM   Modules accepted: Orders

## 2019-02-28 NOTE — Progress Notes (Signed)
Catherine Haney is a 28 y.o. year old  female   who presents for placement of a Liletta IUD. She recently had a baby, has been abstinet and her pregnancy test today is negative.    The risks and benefits of the method and placement have been thouroughly reviewed with the patient and all questions were answered.  Specifically the patient is aware of failure rate of 05/998, expulsion of the IUD and of possible perforation.  The patient is aware of irregular bleeding due to the method and understands the incidence of irregular bleeding diminishes with time.  Time out was performed.  A Graves speculum was placed.  The cervix was prepped using Betadine. The uterus was found to be neutral and it sounded to 8 cm.  The cervix was grasped with a tenaculum and the IUD was inserted to 8 cm.  It was pulled back 1 cm and the IUD was disengaged.  The strings were trimmed to 3 cm.  Sonogram was performed and the proper placement of the IUD was verified.  The patient was instructed on signs and symptoms of infection and to check for the strings after each menses or each month.  The patient is to refrain from intercourse for 3 days.  The patient is scheduled for a return appointment after her first menses or 4 weeks.  Christin Fudge 02/28/2019 8:51 AM

## 2019-03-24 ENCOUNTER — Other Ambulatory Visit: Payer: Self-pay | Admitting: Women's Health

## 2019-03-28 ENCOUNTER — Other Ambulatory Visit: Payer: Self-pay

## 2019-03-28 ENCOUNTER — Encounter: Payer: Self-pay | Admitting: Advanced Practice Midwife

## 2019-03-28 ENCOUNTER — Ambulatory Visit (INDEPENDENT_AMBULATORY_CARE_PROVIDER_SITE_OTHER): Payer: Medicaid Other | Admitting: Advanced Practice Midwife

## 2019-03-28 VITALS — BP 111/72 | HR 77 | Ht 62.0 in | Wt 125.5 lb

## 2019-03-28 DIAGNOSIS — Z862 Personal history of diseases of the blood and blood-forming organs and certain disorders involving the immune mechanism: Secondary | ICD-10-CM

## 2019-03-28 DIAGNOSIS — Z30431 Encounter for routine checking of intrauterine contraceptive device: Secondary | ICD-10-CM | POA: Diagnosis not present

## 2019-03-28 LAB — POCT HEMOGLOBIN: Hemoglobin: 13.5 g/dL (ref 11–14.6)

## 2019-03-28 NOTE — Progress Notes (Signed)
History:  28 y.o. [id number] here today for today for IUD string check; Liletta IUD was placed  02/28/19. No complaints about the IUD, no concerning side effects.  Bleeding every day, not heavy. Concerned if she needs iron.   The following portions of the patient's history were reviewed and updated as appropriate: allergies, current medications, past family history, past medical history, past social history, past surgical history and problem list.  Review of Systems:   Constitutional: Negative for fever and chills Eyes: Negative for visual disturbances Respiratory: Negative for shortness of breath, dyspnea Cardiovascular: Negative for chest pain or palpitations  Gastrointestinal: Negative for vomiting, diarrhea and constipation Genitourinary: Negative for dysuria and urgency Musculoskeletal: Negative for back pain, joint pain, myalgias  Neurological: Negative for headaches    Objective:  Physical Exam Blood pressure 111/72, pulse 77, height 5\' 2"  (1.575 m), weight 125 lb 8 oz (56.9 kg), currently breastfeeding. Gen: NAD Abd: Soft, nontender and nondistended Pelvic: Bedside US reveals properly place IUD.    HGB:  13.2  Assessment & Plan:  Normal IUD check. Patient to keep IUD in place for 6 years; can come in for removal if she desires pregnancy.

## 2019-05-28 ENCOUNTER — Telehealth: Payer: Self-pay | Admitting: Family Medicine

## 2019-05-28 NOTE — Telephone Encounter (Signed)
LMTCB

## 2019-07-04 ENCOUNTER — Ambulatory Visit: Payer: Medicaid Other | Attending: Internal Medicine

## 2019-07-04 ENCOUNTER — Other Ambulatory Visit: Payer: Self-pay

## 2019-07-04 DIAGNOSIS — Z20822 Contact with and (suspected) exposure to covid-19: Secondary | ICD-10-CM | POA: Diagnosis not present

## 2019-07-05 LAB — NOVEL CORONAVIRUS, NAA: SARS-CoV-2, NAA: NOT DETECTED

## 2019-07-08 ENCOUNTER — Telehealth: Payer: Self-pay | Admitting: Family Medicine

## 2019-07-08 NOTE — Telephone Encounter (Signed)
Patient called in and received  Her negative covid test result

## 2019-07-10 ENCOUNTER — Ambulatory Visit (INDEPENDENT_AMBULATORY_CARE_PROVIDER_SITE_OTHER): Payer: Medicaid Other | Admitting: Family Medicine

## 2019-07-10 DIAGNOSIS — M62838 Other muscle spasm: Secondary | ICD-10-CM | POA: Diagnosis not present

## 2019-07-10 DIAGNOSIS — M62830 Muscle spasm of back: Secondary | ICD-10-CM | POA: Diagnosis not present

## 2019-07-10 MED ORDER — NAPROXEN 500 MG PO TABS: 500.0000 mg | ORAL_TABLET | Freq: Two times a day (BID) | ORAL | 0 refills | Status: DC | PRN

## 2019-07-10 NOTE — Progress Notes (Signed)
Telephone visit  Subjective: CC: back pain, arm pain PCP: Dettinger, Fransisca Kaufmann, MD Catherine Haney is a 29 y.o. female calls for telephone consult today. Patient provides verbal consent for consult held via phone.  Due to COVID-19 pandemic this visit was conducted virtually. This visit type was conducted due to national recommendations for restrictions regarding the COVID-19 Pandemic (e.g. social distancing, sheltering in place) in an effort to limit this patient's exposure and mitigate transmission in our community. All issues noted in this document were discussed and addressed.  A physical exam was not performed with this format.   Location of patient: home Location of provider: [facility] Others present for call: none  [place] phone interpreter Leta Jungling, [place] used for Arabic translation of this visit (to LVM)  [place] phone interpreter [place], [place] used for Arabic translation of this visit   1. Back pain/ arm pain/ neck pain Patient reports neck pain, back and arms that has been ongoing for 2 months.  Neck pain is worsening each day.  No preceding injury/ event that precipitated pain.  Pain is described as aching.  She notes difficulty turning neck due to pain.  She reports numbness and tingling in both hands that lasted for 1 day.  She reports right hand weakness.  Pain was intermittent but now has been constant for 7-10 days (day and night).  She has a 27 month old at home and carrying the baby makes pain worse.  She has used Tylenol, Motrin.  Neither medication has helped.  She is not breast feeding.  She was treated previously for similar with physical therapy here in [place] but it had to be discontinued due to pregnancy.  ROS: Per HPI  No Known Allergies Past Medical History:  Diagnosis Date  . Anemia affecting third pregnancy 10/18/2018   HGB 11.7 --> 9.5 ->9.3 ->Fe++-> 10.1  . GERD (gastroesophageal reflux disease)   . PROM (premature rupture of membranes) 01/13/2019  .  Supervision of normal pregnancy 07/10/2018     FAMILY TREE  LAB RESULTS Language Arabic Pap 10/19 per pt [place] PCP Initiated care at 13wk GC/CT Initial: -/-           36wks: Dating by LMP c/w 10wk U/S   Support person [employer] NT/IT:declined PJA:[id number]      MaterniT21:   Barren/HgbE neg Flu vaccine Declined 2/18 CF declined TDaP vaccine declined SMA  Rhogam n/a     Blood Type O/Positive/-- (02/18 1044) Anatomy US Normal female Antibody N    Current Outpatient Medications:  .  ferrous sulfate 325 (65 FE) MG tablet, TAKE 1 TABLET (325 MG TOTAL) BY MOUTH 2 (TWO) TIMES DAILY WITH A MEAL., Disp: 180 tablet, Rfl: 1 .  levonorgestrel (MIRENA) 20 MCG/24HR IUD, 1 each by Intrauterine route once., Disp: , Rfl:  .  prenatal vitamin w/FE, FA (PRENATAL 1 + 1) 27-1 MG TABS tablet, Take 1 tablet by mouth daily at 12 noon., Disp: 30 tablet, Rfl: 11  Assessment/ Plan: 29 y.o. female   1. Muscle spasms of neck Recurrent.  Responded to PT but COVID19 prevented reestablishing previously.  Will treat with higher potency NSAID. Advised to avoid other NSAIDs. Tylenol ok.  Will send home PT directions to home.  Also referred to PT next door.  Encouraged to follow up if no improvement or worsening. She voiced good understanding. - naproxen (NAPROSYN) 500 MG tablet; Take 1 tablet (500 mg total) by mouth 2 (two) times daily as needed for moderate pain (take with  a meal).  Dispense: 30 tablet; Refill: 0 - Ambulatory referral to Physical Therapy  2. Spasm of thoracic back muscle - naproxen (NAPROSYN) 500 MG tablet; Take 1 tablet (500 mg total) by mouth 2 (two) times daily as needed for moderate pain (take with a meal).  Dispense: 30 tablet; Refill: 0 - Ambulatory referral to Physical Therapy   Start time: 2:22pm (LVM); 2:40pm End time: 3:01pm  Total time spent on patient care (including telephone call/ virtual visit): 30 minutes  Catherine Pinho Hulen Skains, DO [facility] [phone]

## 2019-07-10 NOTE — Patient Instructions (Signed)

## 2019-07-16 ENCOUNTER — Other Ambulatory Visit: Payer: Self-pay

## 2019-07-16 ENCOUNTER — Encounter: Payer: Self-pay | Admitting: Physical Therapy

## 2019-07-16 ENCOUNTER — Ambulatory Visit: Payer: Medicaid Other | Attending: Family Medicine | Admitting: Physical Therapy

## 2019-07-16 DIAGNOSIS — R293 Abnormal posture: Secondary | ICD-10-CM

## 2019-07-16 DIAGNOSIS — M542 Cervicalgia: Secondary | ICD-10-CM | POA: Insufficient documentation

## 2019-07-16 DIAGNOSIS — M546 Pain in thoracic spine: Secondary | ICD-10-CM | POA: Diagnosis not present

## 2019-07-16 NOTE — Therapy (Signed)
[facility] [address] [place], [place], [address] Phone: [phone]   Fax:  [phone]  Physical Therapy Evaluation  Patient Details  Name: Catherine Haney MRN: [id number] Date of Birth: [birth year] Referring Provider (PT): Delynn Flavin, DO   Encounter Date: 07/16/2019  PT End of Session - 07/16/19 1021    Visit Number  1    Number of Visits  12    Date for PT Re-Evaluation  09/03/19    Authorization Type  Medicaid    PT Start Time  0900    PT Stop Time  0931    PT Time Calculation (min)  31 min    Activity Tolerance  Patient tolerated treatment well    Behavior During Therapy  [facility] for tasks assessed/performed       Past Medical History:  Diagnosis Date  . Anemia affecting third pregnancy 10/18/2018   HGB 11.7 --> 9.5 ->9.3 ->Fe++-> 10.1  . GERD (gastroesophageal reflux disease)   . PROM (premature rupture of membranes) 01/13/2019  . Supervision of normal pregnancy 07/10/2018     FAMILY TREE  LAB RESULTS Language Arabic Pap 10/19 per pt [place] PCP Initiated care at 13wk GC/CT Initial: -/-           36wks: Dating by LMP c/w 10wk U/S   Support person [employer] NT/IT:declined ONG:[id number]      MaterniT21:   /HgbE neg Flu vaccine Declined 2/18 CF declined TDaP vaccine declined SMA  Rhogam n/a     Blood Type O/Positive/-- (02/18 1044) Anatomy US Normal female Antibody N    Past Surgical History:  Procedure Laterality Date  . DILATION AND CURETTAGE OF UTERUS N/A 08/25/2017   Procedure: SUCTION DILATATION AND CURETTAGE;  Surgeon: Lazaro Arms, MD;  Location: [facility];  Service: Gynecology;  Laterality: N/A;    There were no vitals filed for this visit.   Subjective Assessment - 07/16/19 0859    Subjective  COVID-19 screening performed upon arrival.Patient arrives to physical therapy with reports of cervical and thoracic pain and spasms that progressively worsened over the past year. Patient reports pain with all cervical  motions, particularly with cervical rotation and pain with any overhead activities. Patient states pain increases with caregiving activities for her 30-month son. Patient reports husband assist childcare activities as needed. Patient reports pain at worst as 7-8/10 and 5-6/10 at best utilizing the Wong-Baker Faces Scale. Patient reports pain medication and standing helps but doesn't eliminate pain. Patient's goals are to decrease pain, improve movement, improve ability to perform caregiving activities and home activities.    Pertinent History  unremarkable    Limitations  House hold activities;Lifting;Other (comment)   caregiving activities   Diagnostic tests  none    Patient Stated Goals  decrease pain and improve ability to perform home activities and care giving activities.    Currently in Pain?  Yes    Pain Score  8     Pain Location  Neck    Pain Descriptors / Indicators  Sharp    Pain Type  Chronic pain    Pain Radiating Towards  bilateral hands    Pain Onset  More than a month ago    Pain Frequency  Constant    Aggravating Factors   cervical rotation, overhead activities, child care activities    Pain Relieving Factors  medication, standing.         [facility] PT Assessment - 07/16/19 0001      Assessment   Medical  Diagnosis  Muscle spasms of neck, spasm of thoracic back muscle    Referring Provider (PT)  Delynn Flavin, DO    Onset Date/Surgical Date  --   over one year   Hand Dominance  Right    Next MD Visit  to be scheduled    Prior Therapy  yes      Precautions   Precautions  None      Restrictions   Weight Bearing Restrictions  No      Balance Screen   Has the patient fallen in the past 6 months  No    Has the patient had a decrease in activity level because of a fear of falling?   No    Is the patient reluctant to leave their home because of a fear of falling?   No      Home Environment   Living Environment  Private residence    Living Arrangements   Spouse/significant other;Children      Prior Function   Level of Independence  Independent with basic ADLs      Posture/Postural Control   Posture/Postural Control  Postural limitations    Postural Limitations  Rounded Shoulders;Forward head;Decreased lumbar lordosis      ROM / Strength   AROM / PROM / Strength  AROM      AROM   Overall AROM   Deficits;Due to pain    Overall AROM Comments  Pain primarily at right UT region with all movements    AROM Assessment Site  Cervical;Shoulder    Right/Left Shoulder  Right;Left    Right Shoulder Flexion  140 Degrees    Right Shoulder ABduction  115 Degrees    Left Shoulder Flexion  135 Degrees    Left Shoulder ABduction  115 Degrees    Cervical Flexion  40    Cervical Extension  38    Cervical - Right Side Bend  35    Cervical - Left Side Bend  28    Cervical - Right Rotation  63    Cervical - Left Rotation  58      Palpation   Spinal mobility  pain with AP thoracic joint mobs    Palpation comment  very tender to palpation to cervical spine, increased tone in bilateral UTs R>L, increased tone in upper thoracic paraspinals      Transfers   Transfers  Independent with all Transfers      Ambulation/Gait   Gait Pattern  Within Functional Limits                Objective measurements completed on examination: See above findings.              PT Education - 07/16/19 1020    Education Details  chin tuck, scapular retractions, cross body stretch, cervical rotation    Person(s) Educated  Patient    Methods  Explanation;Demonstration;Handout    Comprehension  Verbalized understanding;Returned demonstration       PT Short Term Goals - 07/16/19 1026      PT SHORT TERM GOAL #1   Title  Patient will be independent with HEP    Baseline  no knowledge of exercises    Time  3    Period  Weeks    Status  New      PT SHORT TERM GOAL #2   Title  --        PT Long Term Goals - 07/16/19 1028      PT  LONG TERM GOAL  #1   Title  Patient will be independent with advanced HEP.    Baseline  no knowledge of HEP    Time  6    Period  Weeks    Status  New      PT LONG TERM GOAL #2   Title  Patient will report ability to peform ADLs and caregiving tasks with neck pain less than or equal to 5/10.    Baseline  8/10 pain with activities    Time  6    Period  Weeks    Status  New      PT LONG TERM GOAL #3   Title  Patient will demonstrate 65 degrees of bilateral cervical rotation AROM to improve ability to perform home tasks    Baseline  58 degrees left, 63 degrees right    Time  6    Period  Weeks    Status  New      PT LONG TERM GOAL #4   Title  Patient will demonstrate 150+ degrees of bilateral shoulder flexion AROM to improve overhead activities.    Baseline  140 degrees right, 135 degrees left    Time  6    Period  Weeks    Status  New             Plan - 07/16/19 1023    Clinical Impression Statement  Patient is a 29 year old female who presents to physical therapy with cervical and upper thoracic pain, decreased cervical and shoulder AROM, and decreased thoracic AP spinal mobility. Patient very tender to palpation to cervical and upper thoracic paraspinals and with increased tone to bilateral UTs R>L. Patient and PT discussed plan of care as well as HEP and the importance of performing to maximize PT benefit. Patient reported understanding. Patient would benefit from skilled physical therapy to address deficits and address patient's goals.    Personal Factors and Comorbidities  Time since onset of injury/illness/exacerbation;Age    Examination-Activity Limitations  Caring for Others;Carry;Lift;Reach Overhead    Stability/Clinical Decision Making  Stable/Uncomplicated    Clinical Decision Making  Low    Rehab Potential  Good    PT Frequency  2x / week    PT Duration  6 weeks    PT Treatment/Interventions  ADLs/Self Care Home Management;Cryotherapy;Electrical Stimulation;Moist  Heat;Traction;Ultrasound;Neuromuscular re-education;Therapeutic exercise;Therapeutic activities;Functional mobility training;Patient/family education;Manual techniques;Passive range of motion;Spinal Manipulations;Taping    PT Next Visit Plan  STW/M, ultrasound Combo to decrease pain, progress with TEs for posture and ROM, modalities PRN for pain relief    PT Home Exercise Plan  see patient education section    Consulted and Agree with Plan of Care  Patient       Patient will benefit from skilled therapeutic intervention in order to improve the following deficits and impairments:  Decreased activity tolerance, Decreased strength, Decreased range of motion, Difficulty walking, Pain, Postural dysfunction  Visit Diagnosis: Cervicalgia  Pain in thoracic spine  Abnormal posture     Problem List Patient Active Problem List   Diagnosis Date Noted  . Encounter for IUD insertion 02/28/2019  . Atypical chest pain 10/17/2017  . Hyperlipidemia 08/27/2015  . GERD (gastroesophageal reflux disease) 08/20/2015  . Generalized anxiety disorder 07/24/2015  . Other seasonal allergic rhinitis 03/18/2015  . Chronic pain of multiple joints 03/18/2015    Gabriela Eves 07/16/2019, 10:32 AM  [facility] [address] [place], [place], [address] Phone: [phone]   Fax:  [phone]  Name: Catherine Haney MRN: [id number] Date of Birth: 1990/11/18

## 2019-07-30 ENCOUNTER — Ambulatory Visit: Payer: Medicaid Other | Admitting: Physical Therapy

## 2019-07-31 ENCOUNTER — Encounter: Payer: Medicaid Other | Admitting: Physical Therapy

## 2019-08-13 ENCOUNTER — Telehealth (INDEPENDENT_AMBULATORY_CARE_PROVIDER_SITE_OTHER): Payer: Medicaid Other | Admitting: Family Medicine

## 2019-08-13 DIAGNOSIS — Z91199 Patient's noncompliance with other medical treatment and regimen due to unspecified reason: Secondary | ICD-10-CM

## 2019-08-13 DIAGNOSIS — Z5329 Procedure and treatment not carried out because of patient's decision for other reasons: Secondary | ICD-10-CM

## 2019-08-13 NOTE — Progress Notes (Signed)
MyChart Video visit  Subjective: CC: fatigue, back pain PCP: Dettinger, Elige Radon, MD  Others present for call:  MyChart video interpreter [place], [place] [phone] used for Arabic translation of this visit  No show, late cancellation.  Will reschedule tomorrow  Start time: 1:15pm (did not join until 1:19pm) End time: 1:24pm

## 2019-08-14 ENCOUNTER — Telehealth (INDEPENDENT_AMBULATORY_CARE_PROVIDER_SITE_OTHER): Payer: Medicaid Other | Admitting: Family Medicine

## 2019-08-14 ENCOUNTER — Telehealth: Payer: Medicaid Other | Admitting: Family Medicine

## 2019-08-14 ENCOUNTER — Encounter: Payer: Self-pay | Admitting: Family Medicine

## 2019-08-14 DIAGNOSIS — R5381 Other malaise: Secondary | ICD-10-CM | POA: Insufficient documentation

## 2019-08-14 DIAGNOSIS — M2550 Pain in unspecified joint: Secondary | ICD-10-CM | POA: Diagnosis not present

## 2019-08-14 DIAGNOSIS — R5382 Chronic fatigue, unspecified: Secondary | ICD-10-CM

## 2019-08-14 DIAGNOSIS — R7989 Other specified abnormal findings of blood chemistry: Secondary | ICD-10-CM

## 2019-08-14 DIAGNOSIS — M62838 Other muscle spasm: Secondary | ICD-10-CM

## 2019-08-14 DIAGNOSIS — M62830 Muscle spasm of back: Secondary | ICD-10-CM | POA: Diagnosis not present

## 2019-08-14 DIAGNOSIS — G8929 Other chronic pain: Secondary | ICD-10-CM | POA: Diagnosis not present

## 2019-08-14 DIAGNOSIS — E559 Vitamin D deficiency, unspecified: Secondary | ICD-10-CM

## 2019-08-14 MED ORDER — PREDNISONE 20 MG PO TABS: ORAL_TABLET | ORAL | 0 refills | Status: DC

## 2019-08-14 MED ORDER — CYCLOBENZAPRINE HCL 5 MG PO TABS: 5.0000 mg | ORAL_TABLET | Freq: Three times a day (TID) | ORAL | 0 refills | Status: DC | PRN

## 2019-08-14 NOTE — Progress Notes (Signed)
Virtual Visit via MyChart Video Note Due to COVID-19 pandemic this visit was conducted virtually. This visit type was conducted due to national recommendations for restrictions regarding the COVID-19 Pandemic (e.g. social distancing, sheltering in place) in an effort to limit this patient's exposure and mitigate transmission in our community. All issues noted in this document were discussed and addressed.  A physical exam was not performed with this format.   [employer] Video interpreter used for entire visit: [id number], [id number]  I connected with Catherine Haney and the stratus video interpreter on 08/14/2019 at [facility] by telephone and verified that I am speaking with the correct person using two identifiers. Catherine Haney is currently located at home and family is currently with them during visit. The provider, Monia Pouch, FNP is located in their office at time of visit.  I discussed the limitations, risks, security and privacy concerns of performing an evaluation and management service by telephone and the availability of in person appointments. I also discussed with the patient that there may be a patient responsible charge related to this service. The patient expressed understanding and agreed to proceed.  Subjective:  Patient ID: Catherine Haney, female    DOB: 03/19/1991, 29 y.o.   MRN: [id number]  Chief Complaint:  Back Pain   HPI: Catherine Haney is a 29 y.o. female presenting on 08/14/2019 for Back Pain   Pt reports ongoing upper back, neck, and shoulder pain with spasms and tightness, no known injuries. States she also feels very tired and has multiple joint pain on a daily basis. She was Vit D deficient and taking repletion therapy but OB stopped this. Will recheck to see if causative.   Back Pain This is a recurrent problem. The current episode started more than 1 month ago. The problem occurs daily. The problem has been waxing and waning since onset. The pain is present in the thoracic  spine. The quality of the pain is described as aching, cramping, shooting and stabbing. Radiates to: neck and shoulders. The pain is at a severity of 5/10. The pain is moderate. The pain is worse during the day. The symptoms are aggravated by bending, position, stress and twisting. Associated symptoms include tingling. Pertinent negatives include no abdominal pain, bladder incontinence, bowel incontinence, chest pain, dysuria, fever, headaches, leg pain, numbness, paresis, paresthesias, pelvic pain, perianal numbness, weakness or weight loss. She has tried analgesics for the symptoms. The treatment provided mild relief.     Relevant past medical, surgical, family, and social history reviewed and updated as indicated.  Allergies and medications reviewed and updated.   Past Medical History:  Diagnosis Date  . Anemia affecting third pregnancy 10/18/2018   HGB 11.7 --> 9.5 ->9.3 ->Fe++-> 10.1  . GERD (gastroesophageal reflux disease)   . PROM (premature rupture of membranes) 01/13/2019  . Supervision of normal pregnancy 07/10/2018     FAMILY TREE  LAB RESULTS Language Arabic Pap 10/19 per pt [facility] PCP Initiated care at 13wk GC/CT Initial: -/-           36wks: Dating by LMP c/w 10wk U/S   Support person [employer] NT/IT:declined [id number]      MaterniT21:   [place]/HgbE neg Flu vaccine Declined 2/18 CF declined TDaP vaccine declined SMA  Rhogam n/a     Blood Type O/Positive/-- (02/18 1044) Anatomy US Normal female Antibody N    Past Surgical History:  Procedure Laterality Date  . DILATION AND CURETTAGE OF UTERUS N/A 08/25/2017   Procedure: SUCTION DILATATION AND CURETTAGE;  Surgeon: Florian Buff, MD;  Location: [facility];  Service: Gynecology;  Laterality: N/A;    Social History   Socioeconomic History  . Marital status: Married    Spouse name: Abdou  . Number of children: 3  . Years of education: Not on file  . Highest education level: Not on file  Occupational History  . Not on file    Tobacco Use  . Smoking status: Never Smoker  . Smokeless tobacco: Never Used  Substance and Sexual Activity  . Alcohol use: Never    Alcohol/week: 0.0 standard drinks  . Drug use: Never  . Sexual activity: Not Currently    Birth control/protection: I.U.D.  Other Topics Concern  . Not on file  Social History Narrative  . Not on file   Social Determinants of Health   Financial Resource Strain:   . Difficulty of Paying Living Expenses:   Food Insecurity:   . Worried About Charity fundraiser in the Last Year:   . Arboriculturist in the Last Year:   Transportation Needs:   . Film/video editor (Medical):   Marland Kitchen Lack of Transportation (Non-Medical):   Physical Activity:   . Days of Exercise per Week:   . Minutes of Exercise per Session:   Stress:   . Feeling of Stress :   Social Connections:   . Frequency of Communication with Friends and Family:   . Frequency of Social Gatherings with Friends and Family:   . Attends Religious Services:   . Active Member of Clubs or Organizations:   . Attends Archivist Meetings:   Marland Kitchen Marital Status:   Intimate Partner Violence:   . Fear of Current or Ex-Partner:   . Emotionally Abused:   Marland Kitchen Physically Abused:   . Sexually Abused:     Outpatient Encounter Medications as of 08/14/2019  Medication Sig  . cyclobenzaprine (FLEXERIL) 5 MG tablet Take 1 tablet (5 mg total) by mouth 3 (three) times daily as needed for muscle spasms.  . ferrous sulfate 325 (65 FE) MG tablet TAKE 1 TABLET (325 MG TOTAL) BY MOUTH 2 (TWO) TIMES DAILY WITH A MEAL.  Marland Kitchen levonorgestrel (MIRENA) 20 MCG/24HR IUD 1 each by Intrauterine route once.  . naproxen (NAPROSYN) 500 MG tablet Take 1 tablet (500 mg total) by mouth 2 (two) times daily as needed for moderate pain (take with a meal).  . predniSONE (DELTASONE) 20 MG tablet 2 po at sametime daily for 5 days  . prenatal vitamin w/FE, FA (PRENATAL 1 + 1) 27-1 MG TABS tablet Take 1 tablet by mouth daily at 12 noon.    No facility-administered encounter medications on file as of 08/14/2019.    No Known Allergies  Review of Systems  Constitutional: Positive for activity change and fatigue. Negative for appetite change, chills, diaphoresis, fever, unexpected weight change and weight loss.  HENT: Negative.   Eyes: Negative.  Negative for photophobia and visual disturbance.  Respiratory: Negative for cough, chest tightness and shortness of breath.   Cardiovascular: Negative for chest pain, palpitations and leg swelling.  Gastrointestinal: Negative for abdominal pain, blood in stool, bowel incontinence, constipation, diarrhea, nausea and vomiting.  Endocrine: Negative.  Negative for cold intolerance, heat intolerance, polydipsia, polyphagia and polyuria.  Genitourinary: Negative for bladder incontinence, decreased urine volume, difficulty urinating, dysuria, frequency, pelvic pain and urgency.  Musculoskeletal: Positive for arthralgias, back pain, joint swelling, myalgias and neck stiffness. Negative for gait problem and neck pain.  Skin: Negative.  Allergic/Immunologic: Negative.   Neurological: Positive for tingling. Negative for dizziness, tremors, seizures, syncope, facial asymmetry, speech difficulty, weakness, light-headedness, numbness, headaches and paresthesias.  Hematological: Negative.   Psychiatric/Behavioral: Negative for confusion, hallucinations, sleep disturbance and suicidal ideas.  All other systems reviewed and are negative.        Observations/Objective: No vital signs or physical exam, this was a telephone or virtual health encounter.  Pt alert and oriented, answers all questions appropriately, and able to speak in full sentences.    Assessment and Plan: Catherine Haney was seen today for back pain.  Diagnoses and all orders for this visit:  Muscle spasms of neck Spasm of thoracic back muscle Symptomatic care discussed in detail. Will refer back to PT. Medications as prescribed.  Report any new or worsening symptoms.  -     Ambulatory referral to Physical Therapy -     cyclobenzaprine (FLEXERIL) 5 MG tablet; Take 1 tablet (5 mg total) by mouth 3 (three) times daily as needed for muscle spasms. -     predniSONE (DELTASONE) 20 MG tablet; 2 po at sametime daily for 5 days -     CMP14+EGFR; Future -     CBC with Differential/Platelet; Future -     Thyroid Panel With TSH; Future -     VITAMIN D 25 Hydroxy (Vit-D Deficiency, Fractures); Future -     ANA,IFA RA Diag Pnl w/rflx Tit/Patn; Future  Chronic pain of multiple joints Chronic fatigue and malaise Ongoing and worsening symptoms. Will check below for underlying causes. Pt to come to office to have drawn.  -     CMP14+EGFR; Future -     CBC with Differential/Platelet; Future -     Thyroid Panel With TSH; Future -     VITAMIN D 25 Hydroxy (Vit-D Deficiency, Fractures); Future -     ANA,IFA RA Diag Pnl w/rflx Tit/Patn; Future     Follow Up Instructions: Return if symptoms worsen or fail to improve.    I discussed the assessment and treatment plan with the patient. The patient was provided an opportunity to ask questions and all were answered. The patient agreed with the plan and demonstrated an understanding of the instructions.   The patient was advised to call back or seek an in-person evaluation if the symptoms worsen or if the condition fails to improve as anticipated.  The above assessment and management plan was discussed with the patient. The patient verbalized understanding of and has agreed to the management plan. Patient is aware to call the clinic if they develop any new symptoms or if symptoms persist or worsen. Patient is aware when to return to the clinic for a follow-up visit. Patient educated on when it is appropriate to go to the emergency department.    I provided 25 minutes of non-face-to-face time during this encounter. The call started at [place]. The call ended at 1005. The other time was used  for coordination of care.    Monia Pouch, FNP-C [facility] [address] [place], [place] [address] [phone] 08/14/2019

## 2019-08-15 ENCOUNTER — Other Ambulatory Visit: Payer: Medicaid Other

## 2019-08-15 ENCOUNTER — Other Ambulatory Visit: Payer: Self-pay

## 2019-08-15 DIAGNOSIS — R5381 Other malaise: Secondary | ICD-10-CM | POA: Diagnosis not present

## 2019-08-15 DIAGNOSIS — R5382 Chronic fatigue, unspecified: Secondary | ICD-10-CM | POA: Diagnosis not present

## 2019-08-15 DIAGNOSIS — M62838 Other muscle spasm: Secondary | ICD-10-CM | POA: Diagnosis not present

## 2019-08-15 DIAGNOSIS — G8929 Other chronic pain: Secondary | ICD-10-CM | POA: Diagnosis not present

## 2019-08-15 DIAGNOSIS — M2550 Pain in unspecified joint: Secondary | ICD-10-CM | POA: Diagnosis not present

## 2019-08-15 DIAGNOSIS — M62830 Muscle spasm of back: Secondary | ICD-10-CM | POA: Diagnosis not present

## 2019-08-15 NOTE — Addendum Note (Signed)
Addended by: Prescott Gum on: 08/15/2019 08:06 AM   Modules accepted: Orders

## 2019-08-16 LAB — CMP14+EGFR
ALT: 20 IU/L (ref 0–32)
AST: 25 IU/L (ref 0–40)
Albumin/Globulin Ratio: 1.3 (ref 1.2–2.2)
Albumin: 4.5 g/dL (ref 3.9–5.0)
Alkaline Phosphatase: 93 IU/L (ref 39–117)
BUN/Creatinine Ratio: 26 — ABNORMAL HIGH (ref 9–23)
BUN: 16 mg/dL (ref 6–20)
Bilirubin Total: <0.2 mg/dL (ref 0.0–1.2)
CO2: 21 mmol/L (ref 20–29)
Calcium: 8.8 mg/dL (ref 8.7–10.2)
Chloride: 102 mmol/L (ref 96–106)
Creatinine, Ser: 0.61 mg/dL (ref 0.57–1.00)
GFR calc Af Amer: 142 mL/min/{1.73_m2} (ref >59)
GFR calc non Af Amer: 123 mL/min/{1.73_m2} (ref >59)
Globulin, Total: 3.4 g/dL (ref 1.5–4.5)
Glucose: 90 mg/dL (ref 65–99)
Potassium: 4.5 mmol/L (ref 3.5–5.2)
Sodium: 136 mmol/L (ref 134–144)
Total Protein: 7.9 g/dL (ref 6.0–8.5)

## 2019-08-16 LAB — CBC WITH DIFFERENTIAL/PLATELET
Basophils Absolute: 0.1 10*3/uL (ref 0.0–0.2)
Basos: 1 %
EOS (ABSOLUTE): 0.2 10*3/uL (ref 0.0–0.4)
Eos: 3 %
Hematocrit: 38.7 % (ref 34.0–46.6)
Hemoglobin: 12.7 g/dL (ref 11.1–15.9)
Immature Grans (Abs): 0 10*3/uL (ref 0.0–0.1)
Immature Granulocytes: 0 %
Lymphocytes Absolute: 2.6 10*3/uL (ref 0.7–3.1)
Lymphs: 44 %
MCH: 28 pg (ref 26.6–33.0)
MCHC: 32.8 g/dL (ref 31.5–35.7)
MCV: 85 fL (ref 79–97)
Monocytes Absolute: 0.4 10*3/uL (ref 0.1–0.9)
Monocytes: 7 %
Neutrophils Absolute: 2.7 10*3/uL (ref 1.4–7.0)
Neutrophils: 45 %
Platelets: 359 10*3/uL (ref 150–450)
RBC: 4.54 x10E6/uL (ref 3.77–5.28)
RDW: 12.9 % (ref 11.7–15.4)
WBC: 5.9 10*3/uL (ref 3.4–10.8)

## 2019-08-16 LAB — ANA,IFA RA DIAG PNL W/RFLX TIT/PATN
ANA Titer 1: NEGATIVE
Cyclic Citrullin Peptide Ab: 5 units (ref 0–19)
Rheumatoid fact SerPl-aCnc: <10 IU/mL (ref 0.0–13.9)

## 2019-08-16 LAB — THYROID PANEL WITH TSH
Free Thyroxine Index: 0.7 — ABNORMAL LOW (ref 1.2–4.9)
T3 Uptake Ratio: 17 % — ABNORMAL LOW (ref 24–39)
T4, Total: 3.9 ug/dL — ABNORMAL LOW (ref 4.5–12.0)
TSH: 68.5 u[IU]/mL — ABNORMAL HIGH (ref 0.450–4.500)

## 2019-08-16 LAB — VITAMIN D 25 HYDROXY (VIT D DEFICIENCY, FRACTURES): Vit D, 25-Hydroxy: 13.8 ng/mL — ABNORMAL LOW (ref 30.0–100.0)

## 2019-08-16 MED ORDER — VITAMIN D (ERGOCALCIFEROL) 1.25 MG (50000 UNIT) PO CAPS: 50000.0000 [IU] | ORAL_CAPSULE | ORAL | 5 refills | Status: DC

## 2019-08-16 NOTE — Addendum Note (Signed)
Addended by: Sonny Masters on: 08/16/2019 01:17 PM   Modules accepted: Orders

## 2019-08-20 ENCOUNTER — Encounter: Payer: Self-pay | Admitting: Physical Therapy

## 2019-08-20 ENCOUNTER — Other Ambulatory Visit: Payer: Self-pay

## 2019-08-20 ENCOUNTER — Ambulatory Visit: Payer: Medicaid Other | Attending: Family Medicine | Admitting: Physical Therapy

## 2019-08-20 DIAGNOSIS — R293 Abnormal posture: Secondary | ICD-10-CM

## 2019-08-20 DIAGNOSIS — M542 Cervicalgia: Secondary | ICD-10-CM | POA: Insufficient documentation

## 2019-08-20 DIAGNOSIS — M6281 Muscle weakness (generalized): Secondary | ICD-10-CM | POA: Diagnosis not present

## 2019-08-20 DIAGNOSIS — M546 Pain in thoracic spine: Secondary | ICD-10-CM

## 2019-08-20 NOTE — Therapy (Signed)
[facility], [place], [address] Phone: [phone]   Fax:  [phone]  Physical Therapy Treatment  Patient Details  Name: Catherine Haney MRN: [id number] Date of Birth: 02-12-1991 Referring Provider (PT): Ronnie Doss, DO   Encounter Date: 08/20/2019  PT End of Session - 08/20/19 1754    Visit Number  2    Number of Visits  12    Date for PT Re-Evaluation  09/03/19    Authorization Type  Medicaid 3 visits approved; 08/15/19 - 08/28/19    Authorization - Visit Number  1    Authorization - Number of Visits  3    PT Start Time  1751    PT Stop Time  1732    PT Time Calculation (min)  47 min    Activity Tolerance  Patient tolerated treatment well    Behavior During Therapy  [facility] for tasks assessed/performed       Past Medical History:  Diagnosis Date  . Anemia affecting third pregnancy 10/18/2018   HGB 11.7 --> 9.5 ->9.3 ->Fe++-> 10.1  . GERD (gastroesophageal reflux disease)   . PROM (premature rupture of membranes) 01/13/2019  . Supervision of normal pregnancy 07/10/2018     FAMILY TREE  LAB RESULTS Language Arabic Pap 10/19 per pt [facility] P[facility] Initiated care at 13wk GC/CT Initial: -/-           36wks: Dating by LMP c/w 10wk U/S   Support person [employer] NT/IT:declined [id number]      MaterniT21:   [place]/HgbE neg Flu vaccine Declined 2/18 CF declined TDaP vaccine declined SMA  Rhogam n/a     Blood Type O/Positive/-- (02/18 1044) Anatomy US Normal female Antibody N    Past Surgical History:  Procedure Laterality Date  . DILATION AND CURETTAGE OF UTERUS N/A 08/25/2017   Procedure: SUCTION DILATATION AND CURETTAGE;  Surgeon: Florian Buff, MD;  Location: [facility];  Service: Gynecology;  Laterality: N/A;    There were no vitals filed for this visit.  Subjective Assessment - 08/20/19 1741    Subjective  COVID-19 screening performed upon arrival. Patient arrives with ongoing cervical and UT pain, 6/10.    Pertinent  History  unremarkable    Limitations  House hold activities;Lifting;Other (comment)    Diagnostic tests  none    Patient Stated Goals  decrease pain and improve ability to perform home activities and care giving activities.    Currently in Pain?  Yes    Pain Score  6     Pain Location  Neck    Pain Orientation  Lower    Pain Descriptors / Indicators  Sharp    Pain Type  Chronic pain    Pain Onset  More than a month ago    Pain Frequency  Constant         [facility] PT Assessment - 08/20/19 0001      Assessment   Medical Diagnosis  Muscle spasms of neck, spasm of thoracic back muscle    Referring Provider (PT)  Ronnie Doss, DO    Hand Dominance  Right    Next MD Visit  to be scheduled    Prior Therapy  yes      Precautions   Precautions  None                   [facility] Adult PT Treatment/Exercise - 08/20/19 0001      Modalities   Modalities  Electrical Stimulation;Moist Heat  Moist Heat Therapy   Number Minutes Moist Heat  15 Minutes    Moist Heat Location  Cervical      Electrical Stimulation   Electrical Stimulation Location  cervical paraspinals and rhomboids    Electrical Stimulation Action  IFC    Electrical Stimulation Parameters  80-150 hz x15 mins    Electrical Stimulation Goals  Pain      Manual Therapy   Manual Therapy  Soft tissue mobilization    Soft tissue mobilization  STW/M in prone to cervical paraspinals and romboids to decrease tone and pain.               PT Short Term Goals - 07/16/19 1026      PT SHORT TERM GOAL #1   Title  Patient will be independent with HEP    Baseline  no knowledge of exercises    Time  3    Period  Weeks    Status  New      PT SHORT TERM GOAL #2   Title  --        PT Long Term Goals - 07/16/19 1028      PT LONG TERM GOAL #1   Title  Patient will be independent with advanced HEP.    Baseline  no knowledge of HEP    Time  6    Period  Weeks    Status  New      PT LONG TERM GOAL #2    Title  Patient will report ability to peform ADLs and caregiving tasks with neck pain less than or equal to 5/10.    Baseline  8/10 pain with activities    Time  6    Period  Weeks    Status  New      PT LONG TERM GOAL #3   Title  Patient will demonstrate 65 degrees of bilateral cervical rotation AROM to improve ability to perform home tasks    Baseline  58 degrees left, 63 degrees right    Time  6    Period  Weeks    Status  New      PT LONG TERM GOAL #4   Title  Patient will demonstrate 150+ degrees of bilateral shoulder flexion AROM to improve overhead activities.    Baseline  140 degrees right, 135 degrees left    Time  6    Period  Weeks    Status  New            Plan - 08/20/19 1751    Clinical Impression Statement  Patient responded fairly well to therapy session but with increased tenderness to lower cervicals and upper thoracic paraspinals. STW/M provided to decrease tone with good response. Patient noted with increased thoracic kyphosis in prone. No adverse affects upon removal of modalities.    Personal Factors and Comorbidities  Time since onset of injury/illness/exacerbation;Age    Examination-Activity Limitations  Caring for Others;Carry;Lift;Reach Overhead    Stability/Clinical Decision Making  Stable/Uncomplicated    Clinical Decision Making  Low    Rehab Potential  Good    PT Frequency  2x / week    PT Duration  6 weeks    PT Treatment/Interventions  ADLs/Self Care Home Management;Cryotherapy;Electrical Stimulation;Moist Heat;Traction;Ultrasound;Neuromuscular re-education;Therapeutic exercise;Therapeutic activities;Functional mobility training;Patient/family education;Manual techniques;Passive range of motion;Spinal Manipulations;Taping    PT Next Visit Plan  STW/M, ultrasound Combo to decrease pain, progress with TEs for posture and ROM, modalities PRN for pain relief  PT Home Exercise Plan  see patient education section    Consulted and Agree with Plan of  Care  Patient       Patient will benefit from skilled therapeutic intervention in order to improve the following deficits and impairments:  Decreased activity tolerance, Decreased strength, Decreased range of motion, Difficulty walking, Pain, Postural dysfunction  Visit Diagnosis: Cervicalgia  Pain in thoracic spine  Abnormal posture  Muscle weakness (generalized)     Problem List Patient Active Problem List   Diagnosis Date Noted  . Chronic fatigue and malaise 08/14/2019  . Encounter for IUD insertion 02/28/2019  . Atypical chest pain 10/17/2017  . Hyperlipidemia 08/27/2015  . GERD (gastroesophageal reflux disease) 08/20/2015  . Generalized anxiety disorder 07/24/2015  . Other seasonal allergic rhinitis 03/18/2015  . Chronic pain of multiple joints 03/18/2015    Guss Bunde, PT, DPT 08/20/2019, 5:55 PM  [facility] [address] [place], [place], [address] Phone: [phone]   Fax:  [phone]  Name: Suezette Lafave MRN: [id number] Date of Birth: 01/06/1991

## 2019-08-21 ENCOUNTER — Other Ambulatory Visit: Payer: Self-pay

## 2019-08-21 ENCOUNTER — Ambulatory Visit: Payer: Medicaid Other | Admitting: Physical Therapy

## 2019-08-21 ENCOUNTER — Encounter: Payer: Self-pay | Admitting: Physical Therapy

## 2019-08-21 DIAGNOSIS — M546 Pain in thoracic spine: Secondary | ICD-10-CM | POA: Diagnosis not present

## 2019-08-21 DIAGNOSIS — R293 Abnormal posture: Secondary | ICD-10-CM | POA: Diagnosis not present

## 2019-08-21 DIAGNOSIS — M542 Cervicalgia: Secondary | ICD-10-CM | POA: Diagnosis not present

## 2019-08-21 DIAGNOSIS — M6281 Muscle weakness (generalized): Secondary | ICD-10-CM | POA: Diagnosis not present

## 2019-08-21 NOTE — Therapy (Signed)
[facility] [address] [place], [place], [address] Phone: [phone]   Fax:  [phone]  Physical Therapy Treatment  Patient Details  Name: Catherine Haney MRN: [id number] Date of Birth: 21-Jul-1990 Referring Provider (PT): Delynn Flavin, DO   Encounter Date: 08/21/2019  PT End of Session - 08/21/19 0909    Visit Number  3    Number of Visits  12    Date for PT Re-Evaluation  09/03/19    Authorization Type  Medicaid 3 visits approved; 08/15/19 - 08/28/19    Authorization - Visit Number  2    Authorization - Number of Visits  3    PT Start Time  0910    PT Stop Time  0952    PT Time Calculation (min)  42 min    Activity Tolerance  Patient tolerated treatment well    Behavior During Therapy  [facility] for tasks assessed/performed       Past Medical History:  Diagnosis Date  . Anemia affecting third pregnancy 10/18/2018   HGB 11.7 --> 9.5 ->9.3 ->Fe++-> 10.1  . GERD (gastroesophageal reflux disease)   . PROM (premature rupture of membranes) 01/13/2019  . Supervision of normal pregnancy 07/10/2018     FAMILY TREE  LAB RESULTS Language Arabic Pap 10/19 per pt [facility] P[facility] Initiated care at 13wk GC/CT Initial: -/-           36wks: Dating by LMP c/w 10wk U/S   Support person [employer] NT/IT:declined [id number]      MaterniT21:   [place]/HgbE neg Flu vaccine Declined 2/18 CF declined TDaP vaccine declined SMA  Rhogam n/a     Blood Type O/Positive/-- (02/18 1044) Anatomy US Normal female Antibody N    Past Surgical History:  Procedure Laterality Date  . DILATION AND CURETTAGE OF UTERUS N/A 08/25/2017   Procedure: SUCTION DILATATION AND CURETTAGE;  Surgeon: Lazaro Arms, MD;  Location: [facility];  Service: Gynecology;  Laterality: N/A;    There were no vitals filed for this visit.  Subjective Assessment - 08/21/19 0909    Subjective  COVID-19 screening performed upon arrival. Patient arrives with ongoing cervical and UT pain, 6/10    Pertinent  History  unremarkable    Limitations  House hold activities;Lifting;Other (comment)    Diagnostic tests  none    Patient Stated Goals  decrease pain and improve ability to perform home activities and care giving activities.    Currently in Pain?  Yes    Pain Score  6     Pain Location  Neck    Pain Orientation  Lower    Pain Descriptors / Indicators  Tightness    Pain Type  Chronic pain    Pain Onset  More than a month ago    Pain Frequency  Constant         [facility] PT Assessment - 08/21/19 0001      Assessment   Medical Diagnosis  Muscle spasms of neck, spasm of thoracic back muscle    Referring Provider (PT)  Delynn Flavin, DO    Hand Dominance  Right    Next MD Visit  to be scheduled    Prior Therapy  yes      Precautions   Precautions  None                   [facility] Adult PT Treatment/Exercise - 08/21/19 0001      Modalities   Modalities  Electrical Stimulation;Moist Heat  Moist Heat Therapy   Number Minutes Moist Heat  15 Minutes    Moist Heat Location  Cervical      Electrical Stimulation   Electrical Stimulation Location  B UT    Electrical Stimulation Action  Pre-Mod    Electrical Stimulation Parameters  80-150 hz x15 min    Electrical Stimulation Goals  Pain      Manual Therapy   Manual Therapy  Soft tissue mobilization    Soft tissue mobilization  STW/M in prone to B cervical paraspinals, UT, levator scapula, rhomboids to decrease tone and pain.               PT Short Term Goals - 07/16/19 1026      PT SHORT TERM GOAL #1   Title  Patient will be independent with HEP    Baseline  no knowledge of exercises    Time  3    Period  Weeks    Status  New      PT SHORT TERM GOAL #2   Title  --        PT Long Term Goals - 07/16/19 1028      PT LONG TERM GOAL #1   Title  Patient will be independent with advanced HEP.    Baseline  no knowledge of HEP    Time  6    Period  Weeks    Status  New      PT LONG TERM GOAL #2    Title  Patient will report ability to peform ADLs and caregiving tasks with neck pain less than or equal to 5/10.    Baseline  8/10 pain with activities    Time  6    Period  Weeks    Status  New      PT LONG TERM GOAL #3   Title  Patient will demonstrate 65 degrees of bilateral cervical rotation AROM to improve ability to perform home tasks    Baseline  58 degrees left, 63 degrees right    Time  6    Period  Weeks    Status  New      PT LONG TERM GOAL #4   Title  Patient will demonstrate 150+ degrees of bilateral shoulder flexion AROM to improve overhead activities.    Baseline  140 degrees right, 135 degrees left    Time  6    Period  Weeks    Status  New            Plan - 08/21/19 0947    Clinical Impression Statement  Patient presented in clinic with reports of cervical muscle tightness and resulting pain. Patient more tender to manual therapy over R UT region. B tightness palpable in B UT, levator scapula region as well. Patient reported relief of muscle tightness following manual therapy. Normal modalities response noted following removal of the modalities.    Personal Factors and Comorbidities  Time since onset of injury/illness/exacerbation;Age    Examination-Activity Limitations  Caring for Others;Carry;Lift;Reach Overhead    Stability/Clinical Decision Making  Stable/Uncomplicated    Rehab Potential  Good    PT Frequency  2x / week    PT Duration  6 weeks    PT Treatment/Interventions  ADLs/Self Care Home Management;Cryotherapy;Electrical Stimulation;Moist Heat;Traction;Ultrasound;Neuromuscular re-education;Therapeutic exercise;Therapeutic activities;Functional mobility training;Patient/family education;Manual techniques;Passive range of motion;Spinal Manipulations;Taping    PT Next Visit Plan  STW/M, ultrasound Combo to decrease pain, progress with TEs for posture and ROM, modalities PRN for pain  relief    PT Home Exercise Plan  see patient education section     Consulted and Agree with Plan of Care  Patient       Patient will benefit from skilled therapeutic intervention in order to improve the following deficits and impairments:  Decreased activity tolerance, Decreased strength, Decreased range of motion, Difficulty walking, Pain, Postural dysfunction  Visit Diagnosis: Cervicalgia  Pain in thoracic spine     Problem List Patient Active Problem List   Diagnosis Date Noted  . Chronic fatigue and malaise 08/14/2019  . Encounter for IUD insertion 02/28/2019  . Atypical chest pain 10/17/2017  . Hyperlipidemia 08/27/2015  . GERD (gastroesophageal reflux disease) 08/20/2015  . Generalized anxiety disorder 07/24/2015  . Other seasonal allergic rhinitis 03/18/2015  . Chronic pain of multiple joints 03/18/2015    Marvell Fuller, PTA 08/21/2019, 10:13 AM  [facility] [address] [place], [place], [address] Phone: [phone]   Fax:  [phone]  Name: Catherine Haney MRN: [id number] Date of Birth: 29-Mar-1991

## 2019-08-27 ENCOUNTER — Ambulatory Visit: Payer: Medicaid Other | Attending: Family Medicine | Admitting: Physical Therapy

## 2019-08-27 ENCOUNTER — Encounter: Payer: Self-pay | Admitting: Physical Therapy

## 2019-08-27 ENCOUNTER — Other Ambulatory Visit: Payer: Self-pay

## 2019-08-27 DIAGNOSIS — M542 Cervicalgia: Secondary | ICD-10-CM

## 2019-08-27 DIAGNOSIS — R293 Abnormal posture: Secondary | ICD-10-CM

## 2019-08-27 DIAGNOSIS — M546 Pain in thoracic spine: Secondary | ICD-10-CM

## 2019-08-27 DIAGNOSIS — M6281 Muscle weakness (generalized): Secondary | ICD-10-CM

## 2019-08-27 NOTE — Therapy (Signed)
[facility] [address] [place], [place], [address] Phone: [phone]   Fax:  [phone]  Physical Therapy Treatment  Patient Details  Name: Catherine Haney MRN: [id number] Date of Birth: 1990/10/29 Referring Provider (PT): Delynn Flavin, DO   Encounter Date: 08/27/2019  PT End of Session - 08/27/19 0912    Visit Number  4    Number of Visits  12    Date for PT Re-Evaluation  09/03/19    Authorization Type  Medicaid 3 visits approved; 08/15/19 - 08/28/19    PT Start Time  0912    PT Stop Time  0948    PT Time Calculation (min)  36 min    Activity Tolerance  Patient tolerated treatment well    Behavior During Therapy  [facility] for tasks assessed/performed       Past Medical History:  Diagnosis Date  . Anemia affecting third pregnancy 10/18/2018   HGB 11.7 --> 9.5 ->9.3 ->Fe++-> 10.1  . GERD (gastroesophageal reflux disease)   . PROM (premature rupture of membranes) 01/13/2019  . Supervision of normal pregnancy 07/10/2018     FAMILY TREE  LAB RESULTS Language Arabic Pap 10/19 per pt [place] PCP Initiated care at 13wk GC/CT Initial: -/-           36wks: Dating by LMP c/w 10wk U/S   Support person [employer] NT/IT:declined GMW:[id number]      MaterniT21:   [place]/HgbE neg Flu vaccine Declined 2/18 CF declined TDaP vaccine declined SMA  Rhogam n/a     Blood Type O/Positive/-- (02/18 1044) Anatomy US Normal female Antibody N    Past Surgical History:  Procedure Laterality Date  . DILATION AND CURETTAGE OF UTERUS N/A 08/25/2017   Procedure: SUCTION DILATATION AND CURETTAGE;  Surgeon: Lazaro Arms, MD;  Location: [facility];  Service: Gynecology;  Laterality: N/A;    There were no vitals filed for this visit.  Subjective Assessment - 08/27/19 0911    Subjective  COVID-19 screening performed upon arrival. Patient arrives with ongoing cervical and UT pain, 7/10    Pertinent History  unremarkable    Limitations  House hold activities;Lifting;Other  (comment)    Diagnostic tests  none    Patient Stated Goals  decrease pain and improve ability to perform home activities and care giving activities.    Currently in Pain?  Yes    Pain Score  7     Pain Location  Neck    Pain Descriptors / Indicators  Sore    Pain Type  Chronic pain    Pain Onset  More than a month ago    Pain Frequency  Constant         [facility] PT Assessment - 08/27/19 0001      Assessment   Medical Diagnosis  Muscle spasms of neck, spasm of thoracic back muscle    Referring Provider (PT)  Delynn Flavin, DO    Hand Dominance  Right    Next MD Visit  to be scheduled    Prior Therapy  yes      Precautions   Precautions  None                   [facility] Adult PT Treatment/Exercise - 08/27/19 0001      Modalities   Modalities  Electrical Stimulation;Moist Heat      Moist Heat Therapy   Number Minutes Moist Heat  10 Minutes    Moist Heat Location  Cervical  Emergency planning/management officer  B UT    Market researcher Parameters  80-150 hz x10 min    Electrical Stimulation Goals  Pain      Manual Therapy   Manual Therapy  Soft tissue mobilization    Soft tissue mobilization  STW/M in prone to B cervical paraspinals, UT, levator scapula, rhomboids to decrease tone and pain.               PT Short Term Goals - 07/16/19 1026      PT SHORT TERM GOAL #1   Title  Patient will be independent with HEP    Baseline  no knowledge of exercises    Time  3    Period  Weeks    Status  New      PT SHORT TERM GOAL #2   Title  --        PT Long Term Goals - 07/16/19 1028      PT LONG TERM GOAL #1   Title  Patient will be independent with advanced HEP.    Baseline  no knowledge of HEP    Time  6    Period  Weeks    Status  New      PT LONG TERM GOAL #2   Title  Patient will report ability to peform ADLs and caregiving tasks with neck pain less than or equal to  5/10.    Baseline  8/10 pain with activities    Time  6    Period  Weeks    Status  New      PT LONG TERM GOAL #3   Title  Patient will demonstrate 65 degrees of bilateral cervical rotation AROM to improve ability to perform home tasks    Baseline  58 degrees left, 63 degrees right    Time  6    Period  Weeks    Status  New      PT LONG TERM GOAL #4   Title  Patient will demonstrate 150+ degrees of bilateral shoulder flexion AROM to improve overhead activities.    Baseline  140 degrees right, 135 degrees left    Time  6    Period  Weeks    Status  New            Plan - 08/27/19 1028    Clinical Impression Statement  Patient presented in clinic with 7/10 cervical pain. Patient presented in clinic with moderate tone in B UT and into mid scap/rhomboids as well. Patient reported feeling relief and feeing better after end of manual therapy session. Normal modalities response noted following removal of the modalities.    Personal Factors and Comorbidities  Time since onset of injury/illness/exacerbation;Age    Examination-Activity Limitations  Caring for Others;Carry;Lift;Reach Overhead    Stability/Clinical Decision Making  Stable/Uncomplicated    Rehab Potential  Good    PT Frequency  2x / week    PT Duration  6 weeks    PT Treatment/Interventions  ADLs/Self Care Home Management;Cryotherapy;Electrical Stimulation;Moist Heat;Traction;Ultrasound;Neuromuscular re-education;Therapeutic exercise;Therapeutic activities;Functional mobility training;Patient/family education;Manual techniques;Passive range of motion;Spinal Manipulations;Taping    PT Next Visit Plan  STW/M, ultrasound Combo to decrease pain, progress with TEs for posture and ROM, modalities PRN for pain relief    PT Home Exercise Plan  see patient education section    Consulted and Agree with Plan of Care  Patient  Patient will benefit from skilled therapeutic intervention in order to improve the following deficits  and impairments:  Decreased activity tolerance, Decreased strength, Decreased range of motion, Difficulty walking, Pain, Postural dysfunction  Visit Diagnosis: Cervicalgia  Pain in thoracic spine  Abnormal posture  Muscle weakness (generalized)     Problem List Patient Active Problem List   Diagnosis Date Noted  . Chronic fatigue and malaise 08/14/2019  . Encounter for IUD insertion 02/28/2019  . Atypical chest pain 10/17/2017  . Hyperlipidemia 08/27/2015  . GERD (gastroesophageal reflux disease) 08/20/2015  . Generalized anxiety disorder 07/24/2015  . Other seasonal allergic rhinitis 03/18/2015  . Chronic pain of multiple joints 03/18/2015    Standley Brooking, PTA 08/27/2019, 10:31 AM  [facility] [address] [place], [place], [address] Phone: [phone]   Fax:  [phone]  Name: Catherine Haney MRN: [id number] Date of Birth: 08-07-1990

## 2019-09-04 ENCOUNTER — Telehealth (INDEPENDENT_AMBULATORY_CARE_PROVIDER_SITE_OTHER): Payer: Medicaid Other | Admitting: Family Medicine

## 2019-09-04 ENCOUNTER — Encounter: Payer: Self-pay | Admitting: Family Medicine

## 2019-09-04 DIAGNOSIS — O905 Postpartum thyroiditis: Secondary | ICD-10-CM | POA: Diagnosis not present

## 2019-09-04 MED ORDER — LEVOTHYROXINE SODIUM 50 MCG PO TABS: 50.0000 ug | ORAL_TABLET | Freq: Every day | ORAL | 1 refills | Status: DC

## 2019-09-04 NOTE — Progress Notes (Signed)
Virtual Visit via mychart video Note  I connected with Catherine Haney on 09/04/19 at 1306 by video and verified that I am speaking with the correct person using two identifiers. Catherine Haney is currently located at home and no other people are currently with her during visit. The provider, Elige Radon Amira Podolak, MD is located in their office at time of visit.  Interpreter used for Arabic  Call ended at 1332  I discussed the limitations, risks, security and privacy concerns of performing an evaluation and management service by video and the availability of in person appointments. I also discussed with the patient that there may be a patient responsible charge related to this service. The patient expressed understanding and agreed to proceed.   History and Present Illness: Patient is tired and low energy. She had a new baby and is 7 months pregnant and was recently diagnosed with thyroiditis.  Her energy is down and she feels like she cannot finish chores.  Referral was placed for endocrinology. Patient is also having irregular periods and have been coming every 2 weeks and she is having weight gain.  1. Postpartum thyroiditis     Outpatient Encounter Medications as of 09/04/2019  Medication Sig  . cyclobenzaprine (FLEXERIL) 5 MG tablet Take 1 tablet (5 mg total) by mouth 3 (three) times daily as needed for muscle spasms.  . ferrous sulfate 325 (65 FE) MG tablet TAKE 1 TABLET (325 MG TOTAL) BY MOUTH 2 (TWO) TIMES DAILY WITH A MEAL.  Marland Kitchen levonorgestrel (MIRENA) 20 MCG/24HR IUD 1 each by Intrauterine route once.  Marland Kitchen levothyroxine (SYNTHROID) 50 MCG tablet Take 1 tablet (50 mcg total) by mouth daily.  . naproxen (NAPROSYN) 500 MG tablet Take 1 tablet (500 mg total) by mouth 2 (two) times daily as needed for moderate pain (take with a meal).  . predniSONE (DELTASONE) 20 MG tablet 2 po at sametime daily for 5 days  . prenatal vitamin w/FE, FA (PRENATAL 1 + 1) 27-1 MG TABS tablet Take 1 tablet by mouth  daily at 12 noon.  . Vitamin D, Ergocalciferol, (DRISDOL) 1.25 MG (50000 UNIT) CAPS capsule Take 1 capsule (50,000 Units total) by mouth every 7 (seven) days.   No facility-administered encounter medications on file as of 09/04/2019.    Review of Systems  Constitutional: Positive for fatigue and unexpected weight change. Negative for chills and fever.  Eyes: Negative for visual disturbance.  Respiratory: Negative for chest tightness and shortness of breath.   Cardiovascular: Negative for chest pain and leg swelling.  Gastrointestinal: Negative for abdominal pain.  Genitourinary: Positive for menstrual problem. Negative for difficulty urinating, dysuria, hematuria, vaginal bleeding, vaginal discharge and vaginal pain.  Musculoskeletal: Negative for back pain and gait problem.  Skin: Negative for rash.  Neurological: Negative for light-headedness and headaches.  Psychiatric/Behavioral: Negative for agitation and behavioral problems.  All other systems reviewed and are negative.   Observations/Objective: Patient sounds comfortable and in no acute distress  Assessment and Plan: Problem List Items Addressed This Visit    None    Visit Diagnoses    Postpartum thyroiditis    -  Primary   Relevant Medications   levothyroxine (SYNTHROID) 50 MCG tablet      Start small dose levothyroxine and see endocrinologist Follow up plan: Return in about 2 months (around 11/04/2019), or if symptoms worsen or fail to improve, for thyroid recheck.     I discussed the assessment and treatment plan with the patient. The patient was provided an  opportunity to ask questions and all were answered. The patient agreed with the plan and demonstrated an understanding of the instructions.   The patient was advised to call back or seek an in-person evaluation if the symptoms worsen or if the condition fails to improve as anticipated.  The above assessment and management plan was discussed with the patient. The  patient verbalized understanding of and has agreed to the management plan. Patient is aware to call the clinic if symptoms persist or worsen. Patient is aware when to return to the clinic for a follow-up visit. Patient educated on when it is appropriate to go to the emergency department.    I provided 26 minutes of non-face-to-face time during this encounter.    Worthy Rancher, MD

## 2019-09-05 ENCOUNTER — Ambulatory Visit: Payer: Medicaid Other | Admitting: Physical Therapy

## 2019-09-05 ENCOUNTER — Encounter: Payer: Self-pay | Admitting: Physical Therapy

## 2019-09-05 ENCOUNTER — Other Ambulatory Visit: Payer: Self-pay

## 2019-09-05 DIAGNOSIS — R293 Abnormal posture: Secondary | ICD-10-CM

## 2019-09-05 DIAGNOSIS — M6281 Muscle weakness (generalized): Secondary | ICD-10-CM | POA: Diagnosis not present

## 2019-09-05 DIAGNOSIS — M546 Pain in thoracic spine: Secondary | ICD-10-CM | POA: Diagnosis not present

## 2019-09-05 DIAGNOSIS — M542 Cervicalgia: Secondary | ICD-10-CM | POA: Diagnosis not present

## 2019-09-05 NOTE — Therapy (Signed)
[facility] [address] [place], [place], [address] Phone: [phone]   Fax:  [phone]  Physical Therapy Treatment  Patient Details  Name: Catherine Haney MRN: [id number] Date of Birth: [birth year] Referring Provider (PT): Delynn Flavin, DO   Encounter Date: 09/05/2019  PT End of Session - 09/05/19 0753    Visit Number  5    Number of Visits  12    Date for PT Re-Evaluation  09/03/19    Authorization Type  Medicaid 3 visits approved; 08/15/19 - 08/28/19    PT Start Time  0753   late arrival   PT Stop Time  0820    PT Time Calculation (min)  27 min    Activity Tolerance  Patient tolerated treatment well    Behavior During Therapy  [facility] for tasks assessed/performed       Past Medical History:  Diagnosis Date  . Anemia affecting third pregnancy 10/18/2018   HGB 11.7 --> 9.5 ->9.3 ->Fe++-> 10.1  . GERD (gastroesophageal reflux disease)   . PROM (premature rupture of membranes) 01/13/2019  . Supervision of normal pregnancy 07/10/2018     FAMILY TREE  LAB RESULTS Language Arabic Pap 10/19 per pt [facility] PCP Initiated care at 13wk GC/CT Initial: -/-           36wks: Dating by LMP c/w 10wk U/S   Support person [employer] NT/IT:declined TDD:[id number]      MaterniT21:   [place]/HgbE neg Flu vaccine Declined 2/18 CF declined TDaP vaccine declined SMA  Rhogam n/a     Blood Type O/Positive/-- (02/18 1044) Anatomy US Normal female Antibody N    Past Surgical History:  Procedure Laterality Date  . DILATION AND CURETTAGE OF UTERUS N/A 08/25/2017   Procedure: SUCTION DILATATION AND CURETTAGE;  Surgeon: Lazaro Arms, MD;  Location: [facility];  Service: Gynecology;  Laterality: N/A;    There were no vitals filed for this visit.  Subjective Assessment - 09/05/19 0752    Subjective  COVID 19 screening performed on patient upon arrival. Patient reports her neck pain is better with 5/10 rating.    Pertinent History  unremarkable    Limitations  House hold  activities;Lifting;Other (comment)    Diagnostic tests  none    Patient Stated Goals  decrease pain and improve ability to perform home activities and care giving activities.    Currently in Pain?  Yes    Pain Score  5     Pain Location  Neck    Pain Orientation  Lower    Pain Descriptors / Indicators  Sore    Pain Type  Chronic pain    Pain Onset  More than a month ago    Pain Frequency  Constant         [facility] PT Assessment - 09/05/19 0001      Assessment   Medical Diagnosis  Muscle spasms of neck, spasm of thoracic back muscle    Referring Provider (PT)  Delynn Flavin, DO    Hand Dominance  Right    Next MD Visit  to be scheduled    Prior Therapy  yes      Precautions   Precautions  None                   [facility] Adult PT Treatment/Exercise - 09/05/19 0001      Modalities   Modalities  Electrical Stimulation;Moist Heat      Moist Heat Therapy   Number Minutes Moist  Heat  15 Minutes    Moist Heat Location  Cervical      Electrical Stimulation   Electrical Stimulation Location  B UT, rhomboids    Electrical Stimulation Action  IFC    Electrical Stimulation Parameters  80-150 hz x15 min    Electrical Stimulation Goals  Pain      Manual Therapy   Manual Therapy  Soft tissue mobilization    Soft tissue mobilization  STW/M in prone to B UT, levator scapula, rhomboids to decrease tone and pain.               PT Short Term Goals - 07/16/19 1026      PT SHORT TERM GOAL #1   Title  Patient will be independent with HEP    Baseline  no knowledge of exercises    Time  3    Period  Weeks    Status  New      PT SHORT TERM GOAL #2   Title  --        PT Long Term Goals - 07/16/19 1028      PT LONG TERM GOAL #1   Title  Patient will be independent with advanced HEP.    Baseline  no knowledge of HEP    Time  6    Period  Weeks    Status  New      PT LONG TERM GOAL #2   Title  Patient will report ability to peform ADLs and caregiving tasks  with neck pain less than or equal to 5/10.    Baseline  8/10 pain with activities    Time  6    Period  Weeks    Status  New      PT LONG TERM GOAL #3   Title  Patient will demonstrate 65 degrees of bilateral cervical rotation AROM to improve ability to perform home tasks    Baseline  58 degrees left, 63 degrees right    Time  6    Period  Weeks    Status  New      PT LONG TERM GOAL #4   Title  Patient will demonstrate 150+ degrees of bilateral shoulder flexion AROM to improve overhead activities.    Baseline  140 degrees right, 135 degrees left    Time  6    Period  Weeks    Status  New            Plan - 09/05/19 0810    Clinical Impression Statement  Patient presented in clinic with reports of less cervical pain but continued UE pain and times of pain in upper back. Treatment limited due to late arrival for appointment. Minimal palpable tightness present in B rhomboids, min-mod palpable tightness of B UT, levator scapula. Normal modalities response noted following removal of the modalities.    Personal Factors and Comorbidities  Time since onset of injury/illness/exacerbation;Age    Examination-Activity Limitations  Caring for Others;Carry;Lift;Reach Overhead    Stability/Clinical Decision Making  Stable/Uncomplicated    Rehab Potential  Good    PT Frequency  2x / week    PT Duration  6 weeks    PT Treatment/Interventions  ADLs/Self Care Home Management;Cryotherapy;Electrical Stimulation;Moist Heat;Traction;Ultrasound;Neuromuscular re-education;Therapeutic exercise;Therapeutic activities;Functional mobility training;Patient/family education;Manual techniques;Passive range of motion;Spinal Manipulations;Taping    PT Next Visit Plan  STW/M, ultrasound Combo to decrease pain, progress with TEs for posture and ROM, modalities PRN for pain relief    PT Home Exercise Plan  see patient education section    Consulted and Agree with Plan of Care  Patient       Patient will benefit  from skilled therapeutic intervention in order to improve the following deficits and impairments:  Decreased activity tolerance, Decreased strength, Decreased range of motion, Difficulty walking, Pain, Postural dysfunction  Visit Diagnosis: Cervicalgia  Pain in thoracic spine  Abnormal posture  Muscle weakness (generalized)     Problem List Patient Active Problem List   Diagnosis Date Noted  . Chronic fatigue and malaise 08/14/2019  . Encounter for IUD insertion 02/28/2019  . Atypical chest pain 10/17/2017  . Hyperlipidemia 08/27/2015  . GERD (gastroesophageal reflux disease) 08/20/2015  . Generalized anxiety disorder 07/24/2015  . Other seasonal allergic rhinitis 03/18/2015  . Chronic pain of multiple joints 03/18/2015    Standley Brooking, PTA 09/05/2019, 8:53 AM  [facility] [address] [place], [place], [address] Phone: [phone]   Fax:  [phone]  Name: Catherine Haney MRN: [id number] Date of Birth: [birth year]

## 2019-09-09 ENCOUNTER — Other Ambulatory Visit: Payer: Self-pay

## 2019-09-09 ENCOUNTER — Ambulatory Visit: Payer: Medicaid Other | Admitting: Physical Therapy

## 2019-09-09 ENCOUNTER — Encounter: Payer: Self-pay | Admitting: Physical Therapy

## 2019-09-09 DIAGNOSIS — R293 Abnormal posture: Secondary | ICD-10-CM

## 2019-09-09 DIAGNOSIS — M6281 Muscle weakness (generalized): Secondary | ICD-10-CM | POA: Diagnosis not present

## 2019-09-09 DIAGNOSIS — M542 Cervicalgia: Secondary | ICD-10-CM

## 2019-09-09 DIAGNOSIS — M546 Pain in thoracic spine: Secondary | ICD-10-CM

## 2019-09-09 NOTE — Therapy (Signed)
[facility] [address] [place], [place], [address] Phone: [phone]   Fax:  [phone]  Physical Therapy Treatment  Patient Details  Name: Catherine Haney MRN: [id number] Date of Birth: 03/31/1991 Referring Provider (PT): Delynn Flavin, DO   Encounter Date: 09/09/2019  PT End of Session - 09/09/19 0854    Visit Number  6    Number of Visits  12    Date for PT Re-Evaluation  09/03/19    Authorization Type  Medicaid 3 visits approved; 08/15/19 - 08/28/19    PT Start Time  0815    PT Stop Time  0903    PT Time Calculation (min)  48 min    Activity Tolerance  Patient tolerated treatment well    Behavior During Therapy  [facility] [employer] for tasks assessed/performed       Past Medical History:  Diagnosis Date  . Anemia affecting third pregnancy 10/18/2018   HGB 11.7 --> 9.5 ->9.3 ->Fe++-> 10.1  . GERD (gastroesophageal reflux disease)   . PROM (premature rupture of membranes) 01/13/2019  . Supervision of normal pregnancy 07/10/2018     FAMILY TREE  LAB RESULTS Language Arabic Pap 10/19 per pt [place] PCP Initiated care at 13wk GC/CT Initial: -/-           36wks: Dating by LMP c/w 10wk U/S   Support person [employer] NT/IT:declined [id number]      MaterniT21:   [place]/HgbE neg Flu vaccine Declined 2/18 CF declined TDaP vaccine declined SMA  Rhogam n/a     Blood Type O/Positive/-- (02/18 1044) Anatomy US Normal female Antibody N    Past Surgical History:  Procedure Laterality Date  . DILATION AND CURETTAGE OF UTERUS N/A 08/25/2017   Procedure: SUCTION DILATATION AND CURETTAGE;  Surgeon: Lazaro Arms, MD;  Location: [facility];  Service: Gynecology;  Laterality: N/A;    There were no vitals filed for this visit.  Subjective Assessment - 09/09/19 0816    Subjective  COVID 19 screening performed on patient upon arrival. Patient reported 5/10 today, some improvement with therapy    Pertinent History  unremarkable    Limitations  House hold  activities;Lifting;Other (comment)    Diagnostic tests  none    Patient Stated Goals  decrease pain and improve ability to perform home activities and care giving activities.    Currently in Pain?  Yes    Pain Score  5     Pain Location  Neck    Pain Orientation  Lower    Pain Descriptors / Indicators  Sore    Pain Type  Chronic pain    Pain Onset  More than a month ago    Pain Frequency  Constant    Aggravating Factors   movement    Pain Relieving Factors  rest         [facility] PT Assessment - 09/09/19 0001      AROM   AROM Assessment Site  Shoulder;Cervical    Right/Left Shoulder  Right;Left    Right Shoulder Flexion  125 Degrees    Left Shoulder Flexion  135 Degrees    Cervical - Right Rotation  47    Cervical - Left Rotation  55                   [facility] Adult PT Treatment/Exercise - 09/09/19 0001      Modalities   Modalities  Electrical Stimulation;Moist Heat;Ultrasound      Moist Heat Therapy   Number  Minutes Moist Heat  15 Minutes    Moist Heat Location  Cervical      Electrical Stimulation   Electrical Stimulation Location  B UT, rhomboids    Electrical Stimulation Action  IFC    Electrical Stimulation Parameters  80-150hz  x67min    Electrical Stimulation Goals  Pain      Ultrasound   Ultrasound Location  bil cervical UT/paraspinals    Ultrasound Parameters  1.5w/cm2/100%/45mhz x 33min    Ultrasound Goals  Pain      Manual Therapy   Manual Therapy  Soft tissue mobilization    Soft tissue mobilization  STW/M  B UT, levator scapula, rhomboids to decrease tone and pain.               PT Short Term Goals - 07/16/19 1026      PT SHORT TERM GOAL #1   Title  Patient will be independent with HEP    Baseline  no knowledge of exercises    Time  3    Period  Weeks    Status  New      PT SHORT TERM GOAL #2   Title  --        PT Long Term Goals - 09/09/19 0818      PT LONG TERM GOAL #1   Title  Patient will be independent with advanced  HEP.    Baseline  no knowledge of HEP    Time  6    Period  Weeks    Status  On-going      PT LONG TERM GOAL #2   Title  Patient will report ability to peform ADLs and caregiving tasks with neck pain less than or equal to 5/10.    Baseline  8/10 pain with activities    Time  6    Period  Weeks    Status  On-going      PT LONG TERM GOAL #3   Title  Patient will demonstrate 65 degrees of bilateral cervical rotation AROM to improve ability to perform home tasks    Baseline  58 degrees left, 63 degrees right    Time  6    Period  Weeks    Status  On-going      PT LONG TERM GOAL #4   Title  Patient will demonstrate 150+ degrees of bilateral shoulder flexion AROM to improve overhead activities.    Baseline  140 degrees right, 135 degrees left    Time  6    Period  Weeks    Status  On-going            Plan - 09/09/19 0855    Clinical Impression Statement  Patient tolerated treatment well today although her musles in cervial/UT and mid back remain tight and sore with palpation. Today performed US to bil cervical UT to reduce pain followed by manual STW to area to reduce tightness. Patient was educated on self stretches and upright posture to help improve mobility. Patient ROM has not improved at this time. Current goals ongoing. Normal response upon removal of modalities.    Personal Factors and Comorbidities  Time since onset of injury/illness/exacerbation;Age    Examination-Activity Limitations  Caring for Others;Carry;Lift;Reach Overhead    Stability/Clinical Decision Making  Stable/Uncomplicated    Rehab Potential  Good    PT Frequency  2x / week    PT Duration  6 weeks    PT Treatment/Interventions  ADLs/Self Care Home Management;Cryotherapy;Electrical Stimulation;Moist Heat;Traction;Ultrasound;Neuromuscular re-education;Therapeutic  exercise;Therapeutic activities;Functional mobility training;Patient/family education;Manual techniques;Passive range of motion;Spinal  Manipulations;Taping    PT Next Visit Plan  cont with STW/M, ultrasound Combo to decrease pain, progress with TEs for posture and ROM, modalities PRN for pain relief    Consulted and Agree with Plan of Care  Patient       Patient will benefit from skilled therapeutic intervention in order to improve the following deficits and impairments:  Decreased activity tolerance, Decreased strength, Decreased range of motion, Difficulty walking, Pain, Postural dysfunction  Visit Diagnosis: Cervicalgia  Pain in thoracic spine  Abnormal posture  Muscle weakness (generalized)     Problem List Patient Active Problem List   Diagnosis Date Noted  . Chronic fatigue and malaise 08/14/2019  . Encounter for IUD insertion 02/28/2019  . Atypical chest pain 10/17/2017  . Hyperlipidemia 08/27/2015  . GERD (gastroesophageal reflux disease) 08/20/2015  . Generalized anxiety disorder 07/24/2015  . Other seasonal allergic rhinitis 03/18/2015  . Chronic pain of multiple joints 03/18/2015    Oluwasemilore Bahl P, PTA 09/09/2019, 9:05 AM  [facility] [address] [place], [place], [address] Phone: [phone]   Fax:  [phone]  Name: Sheng Pritz MRN: [id number] Date of Birth: 14-Jun-1990

## 2019-09-11 ENCOUNTER — Ambulatory Visit (INDEPENDENT_AMBULATORY_CARE_PROVIDER_SITE_OTHER): Payer: Medicaid Other | Admitting: "Endocrinology

## 2019-09-11 ENCOUNTER — Encounter: Payer: Self-pay | Admitting: "Endocrinology

## 2019-09-11 ENCOUNTER — Ambulatory Visit: Payer: Medicaid Other | Admitting: Physical Therapy

## 2019-09-11 ENCOUNTER — Telehealth: Payer: Self-pay | Admitting: "Endocrinology

## 2019-09-11 ENCOUNTER — Other Ambulatory Visit: Payer: Self-pay

## 2019-09-11 VITALS — BP 106/74 | HR 74 | Ht 62.0 in | Wt 139.2 lb

## 2019-09-11 DIAGNOSIS — E039 Hypothyroidism, unspecified: Secondary | ICD-10-CM | POA: Diagnosis not present

## 2019-09-11 MED ORDER — LEVOTHYROXINE SODIUM 50 MCG PO TABS: 50.0000 ug | ORAL_TABLET | Freq: Every day | ORAL | 1 refills | Status: DC

## 2019-09-11 NOTE — Telephone Encounter (Signed)
I am sorry, she is saying [facility].

## 2019-09-11 NOTE — Telephone Encounter (Signed)
She told me [facility] and that is where I sent it. Does she want to switch?

## 2019-09-11 NOTE — Telephone Encounter (Signed)
[facility], I will resend.

## 2019-09-11 NOTE — Telephone Encounter (Signed)
Dr Fransico Him Can you send her levothyroxine (SYNTHROID) 50 MCG tablet again? She said [facility] not receive it

## 2019-09-11 NOTE — Progress Notes (Signed)
Endocrinology Consult Note                                         09/11/2019, 6:45 PM   Catherine Haney is a 29 y.o.-year-old female patient being seen in consultation for hypothyroidism referred by Dettinger, Fransisca Kaufmann, MD.   Past Medical History:  Diagnosis Date  . Anemia affecting third pregnancy 10/18/2018   HGB 11.7 --> 9.5 ->9.3 ->Fe++-> 10.1  . GERD (gastroesophageal reflux disease)   . PROM (premature rupture of membranes) 01/13/2019  . Supervision of normal pregnancy 07/10/2018     FAMILY TREE  LAB RESULTS Language Arabic Pap 10/19 per pt [place] PCP Initiated care at 13wk GC/CT Initial: -/-           36wks: Dating by LMP c/w 10wk U/S   Support person [employer] NT/IT:declined [id number]      MaterniT21:   [place]/HgbE neg Flu vaccine Declined 2/18 CF declined TDaP vaccine declined SMA  Rhogam n/a     Blood Type O/Positive/-- (02/18 1044) Anatomy US Normal female Antibody N    Past Surgical History:  Procedure Laterality Date  . DILATION AND CURETTAGE OF UTERUS N/A 08/25/2017   Procedure: SUCTION DILATATION AND CURETTAGE;  Surgeon: Florian Buff, MD;  Location: [facility];  Service: Gynecology;  Laterality: N/A;    Social History   Socioeconomic History  . Marital status: Married    Spouse name: Abdou  . Number of children: 3  . Years of education: Not on file  . Highest education level: Not on file  Occupational History  . Not on file  Tobacco Use  . Smoking status: Never Smoker  . Smokeless tobacco: Never Used  Substance and Sexual Activity  . Alcohol use: Never    Alcohol/week: 0.0 standard drinks  . Drug use: Never  . Sexual activity: Not Currently    Birth control/protection: I.U.D.  Other Topics Concern  . Not on file  Social History Narrative  . Not on file   Social Determinants of Health   Financial Resource Strain:   . Difficulty of Paying Living Expenses:   Food Insecurity:   .  Worried About Charity fundraiser in the Last Year:   . Arboriculturist in the Last Year:   Transportation Needs:   . Film/video editor (Medical):   Marland Kitchen Lack of Transportation (Non-Medical):   Physical Activity:   . Days of Exercise per Week:   . Minutes of Exercise per Session:   Stress:   . Feeling of Stress :   Social Connections:   . Frequency of Communication with Friends and Family:   . Frequency of Social Gatherings with Friends and Family:   . Attends Religious Services:   . Active Member of Clubs or Organizations:   . Attends Archivist Meetings:   Marland Kitchen Marital Status:     Family History  Problem Relation Age of Onset  .  Hypertension Mother   . Diabetes Mother   . Diabetes Father   . Hypertension Paternal Grandfather     Outpatient Encounter Medications as of 09/11/2019  Medication Sig  . omeprazole (PRILOSEC) 20 MG capsule Take 20 mg by mouth daily.  Marland Kitchen levonorgestrel (MIRENA) 20 MCG/24HR IUD 1 each by Intrauterine route once.  . prenatal vitamin w/FE, FA (PRENATAL 1 + 1) 27-1 MG TABS tablet Take 1 tablet by mouth daily at 12 noon.  . Vitamin D, Ergocalciferol, (DRISDOL) 1.25 MG (50000 UNIT) CAPS capsule Take 1 capsule (50,000 Units total) by mouth every 7 (seven) days.  . [DISCONTINUED] cyclobenzaprine (FLEXERIL) 5 MG tablet Take 1 tablet (5 mg total) by mouth 3 (three) times daily as needed for muscle spasms.  . [DISCONTINUED] ferrous sulfate 325 (65 FE) MG tablet TAKE 1 TABLET (325 MG TOTAL) BY MOUTH 2 (TWO) TIMES DAILY WITH A MEAL.  . [DISCONTINUED] levothyroxine (SYNTHROID) 50 MCG tablet Take 1 tablet (50 mcg total) by mouth daily.  . [DISCONTINUED] levothyroxine (SYNTHROID) 50 MCG tablet Take 1 tablet (50 mcg total) by mouth daily before breakfast.  . [DISCONTINUED] levothyroxine (SYNTHROID) 50 MCG tablet Take 1 tablet (50 mcg total) by mouth daily before breakfast.  . [DISCONTINUED] naproxen (NAPROSYN) 500 MG tablet Take 1 tablet (500 mg total) by mouth  2 (two) times daily as needed for moderate pain (take with a meal).  . [DISCONTINUED] predniSONE (DELTASONE) 20 MG tablet 2 po at sametime daily for 5 days   No facility-administered encounter medications on file as of 09/11/2019.    ALLERGIES: No Known Allergies VACCINATION STATUS: Immunization History  Administered Date(s) Administered  . Hepatitis B 07/25/2007, 04/03/2013  . IPV 07/25/2007  . Influenza-Unspecified 04/03/2013, 02/26/2014  . MMR 07/25/2007  . Td 07/25/2007, 04/03/2013  . Tdap 07/25/2007, 04/03/2013, 10/11/2017  . Varicella 07/25/2007     HPI   She is being seen with Phone Based Arabic interpreter  Deatra Canter ID [id number] Catherine Haney  is a patient with the above medical history. she was diagnosed  with postpartum thyroiditis with significantly elevated TSH suggestive of hypothyroidism in March 2021.  She was initiated on levothyroxine prescription however she did not pick up.  -She is 8 months postpartum.  She has no family history of thyroid dysfunction. She reports fatigue, weight gain, constipation, cold intolerance.  She denies dysphagia, shortness of breath, nor voice change.  She denies any prior history of thyroid dysfunction. -Review of her thyroid function test is consistent with primary hypothyroidism.  No family history of thyroid cancer.  No history of  radiation therapy to head or neck. No recent use of iodine supplements.    ROS:  Constitutional: + weight gain,  +fatigue, + subjective hypothermia Eyes: no blurry vision, no xerophthalmia ENT: no sore throat, no nodules palpated in throat, no dysphagia/odynophagia, no hoarseness Cardiovascular: no Chest Pain, no Shortness of Breath, no palpitations, no leg swelling Respiratory: no cough, no SOB Gastrointestinal: no Nausea/Vomiting/Diarhhea Musculoskeletal: no muscle/joint aches Skin: no rashes Neurological: no tremors, no numbness, no tingling, no dizziness Psychiatric: no depression, no  anxiety   Physical Exam: BP 106/74   Pulse 74   Ht '5\' 2"'  (1.575 m)   Wt 139 lb 3.2 oz (63.1 kg)   BMI 25.46 kg/m  Wt Readings from Last 3 Encounters:  09/11/19 139 lb 3.2 oz (63.1 kg)  03/28/19 125 lb 8 oz (56.9 kg)  02/28/19 120 lb (54.4 kg)    Constitutional:  Body mass index is 25.46 kg/m.,  not in acute distress, normal state of mind Eyes: PERRLA, EOMI, no exophthalmos ENT: moist mucous membranes, no thyromegaly, no cervical lymphadenopathy Cardiovascular: normal precordial activity, Regular Rate and Rhythm, no Murmur/Rubs/Gallops Respiratory:  adequate breathing efforts, no gross chest deformity, Clear to auscultation bilaterally Gastrointestinal: abdomen soft, Non -tender, No distension, Bowel Sounds present Musculoskeletal: no gross deformities, strength intact in all four extremities Skin: moist, warm, no rashes Neurological: no tremor with outstretched hands, Deep tendon reflexes normal in all four extremities.   CMP ( most recent) CMP     Component Value Date/Time   NA 136 08/15/2019 0807   K 4.5 08/15/2019 0807   CL 102 08/15/2019 0807   CO2 21 08/15/2019 0807   GLUCOSE 90 08/15/2019 0807   GLUCOSE 94 10/30/2018 0555   BUN 16 08/15/2019 0807   CREATININE 0.61 08/15/2019 0807   CALCIUM 8.8 08/15/2019 0807   PROT 7.9 08/15/2019 0807   ALBUMIN 4.5 08/15/2019 0807   AST 25 08/15/2019 0807   ALT 20 08/15/2019 0807   ALKPHOS 93 08/15/2019 0807   BILITOT <0.2 08/15/2019 0807   GFRNONAA 123 08/15/2019 0807   GFRAA 142 08/15/2019 0807     Diabetic Labs (most recent): No results found for: HGBA1C   Lipid Panel ( most recent) Lipid Panel     Component Value Date/Time   CHOL 199 08/20/2015 1116   TRIG 56 08/20/2015 1116   HDL 47 08/20/2015 1116   CHOLHDL 4.2 08/20/2015 1116   LDLCALC 141 (H) 08/20/2015 1116   LABVLDL 11 08/20/2015 1116    Results for SOLENNE, MANWARREN (MRN [id number]) as of 09/11/2019 18:52  Ref. Range 08/15/2019 08:07  TSH Latest Ref  Range: 0.450 - 4.500 uIU/mL 68.500 (H)  Thyroxine (T4) Latest Ref Range: 4.5 - 12.0 ug/dL 3.9 (L)  Free Thyroxine Index Latest Ref Range: 1.2 - 4.9  0.7 (L)  T3 Uptake Ratio Latest Ref Range: 24 - 39 % 17 (L)       ASSESSMENT: 1. Hypothyroidism 2.  Thyroiditis-resolved  PLAN:    Patient with hypothyroidism likely related to postpartum thyroiditis.  She is symptomatic and will benefit from thyroid hormone supplement.  I discussed initiated levothyroxine 50 mcg p.o. daily before breakfast.    - We discussed about correct intake of levothyroxine, at fasting, with water, separated by at least 30 minutes from breakfast, and separated by more than 4 hours from calcium, iron, multivitamins, acid reflux medications (PPIs). -Patient is made aware of the fact that thyroid hormone replacement is needed for life, dose to be adjusted by periodic monitoring of thyroid function tests. - Will check thyroid tests before next visit: TSH, free T4 -Due to absence of clinical goiter, no need for thyroid ultrasound.  Her next thyroid function test will include antithyroid antibodies to determine etiology of her hypothyroidism.  I used Arabic language interpreter Deatra Canter to convey my decisions to her.  - Time spent with the patient: 60 minutes, of which >50% was spent in obtaining information about her symptoms, reviewing her previous labs, evaluations, and treatments, counseling her about her hypothyroidism, and developing a plan to confirm the diagnosis and long term treatment as necessary. Please refer to " Patient Self Inventory" in the Media  tab for reviewed elements of pertinent patient history.  [name] participated in the discussions, expressed understanding, and voiced agreement with the above plans.  All questions were answered to her satisfaction. she is encouraged to contact clinic should she have any questions or concerns prior to  her return visit.  Return in about 3 months (around 12/11/2019)  for Follow up with Pre-visit Labs.  Glade Lloyd, MD [facility] [address] [place], [place] [address] Phone: [phone]  Fax: [phone]   09/11/2019, 6:45 PM  This note was partially dictated with voice recognition software. Similar sounding words can be transcribed inadequately or may not  be corrected upon review.

## 2019-09-16 ENCOUNTER — Ambulatory Visit: Payer: Medicaid Other | Admitting: Physical Therapy

## 2019-09-18 ENCOUNTER — Other Ambulatory Visit: Payer: Self-pay

## 2019-09-18 ENCOUNTER — Encounter: Payer: Self-pay | Admitting: Physical Therapy

## 2019-09-18 ENCOUNTER — Ambulatory Visit: Payer: Medicaid Other | Admitting: Physical Therapy

## 2019-09-18 DIAGNOSIS — R293 Abnormal posture: Secondary | ICD-10-CM | POA: Diagnosis not present

## 2019-09-18 DIAGNOSIS — M546 Pain in thoracic spine: Secondary | ICD-10-CM | POA: Diagnosis not present

## 2019-09-18 DIAGNOSIS — M542 Cervicalgia: Secondary | ICD-10-CM | POA: Diagnosis not present

## 2019-09-18 DIAGNOSIS — M6281 Muscle weakness (generalized): Secondary | ICD-10-CM | POA: Diagnosis not present

## 2019-09-18 NOTE — Therapy (Signed)
[facility] [place], [place], [address] Phone: [phone]   Fax:  [phone]  Physical Therapy Treatment  Patient Details  Name: Catherine Haney MRN: [id number] Date of Birth: September 19, 1990 Referring Provider (PT): Ronnie Doss, DO   Encounter Date: 09/18/2019  PT End of Session - 09/18/19 0847    Visit Number  7    Number of Visits  12    Date for PT Re-Evaluation  09/03/19    PT Start Time  0816    PT Stop Time  0856    PT Time Calculation (min)  40 min    Activity Tolerance  Patient tolerated treatment well    Behavior During Therapy  [facility] for tasks assessed/performed       Past Medical History:  Diagnosis Date  . Anemia affecting third pregnancy 10/18/2018   HGB 11.7 --> 9.5 ->9.3 ->Fe++-> 10.1  . GERD (gastroesophageal reflux disease)   . PROM (premature rupture of membranes) 01/13/2019  . Supervision of normal pregnancy 07/10/2018     FAMILY TREE  LAB RESULTS Language Arabic Pap 10/19 per pt [place] PCP Initiated care at 13wk GC/CT Initial: -/-           36wks: Dating by LMP c/w 10wk U/S   Support person [employer] NT/IT:declined [id number]      MaterniT21:   [place]/HgbE neg Flu vaccine Declined 2/18 CF declined TDaP vaccine declined SMA  Rhogam n/a     Blood Type O/Positive/-- (02/18 1044) Anatomy US Normal female Antibody N    Past Surgical History:  Procedure Laterality Date  . DILATION AND CURETTAGE OF UTERUS N/A 08/25/2017   Procedure: SUCTION DILATATION AND CURETTAGE;  Surgeon: Florian Buff, MD;  Location: [facility];  Service: Gynecology;  Laterality: N/A;    There were no vitals filed for this visit.  Subjective Assessment - 09/18/19 0819    Subjective  COVID 19 screening performed on patient upon arrival. Patient reported ongoing pain    Pertinent History  unremarkable    Limitations  House hold activities;Lifting;Other (comment)    Diagnostic tests  none    Patient Stated Goals  decrease pain and  improve ability to perform home activities and care giving activities.    Currently in Pain?  Yes    Pain Score  7     Pain Location  Neck    Pain Orientation  Lower    Pain Descriptors / Indicators  Discomfort    Pain Type  Chronic pain    Pain Radiating Towards  mid back    Pain Onset  More than a month ago    Pain Frequency  Constant    Aggravating Factors   lifting and mvement    Pain Relieving Factors  rest                       [facility] Adult PT Treatment/Exercise - 09/18/19 0001      Moist Heat Therapy   Number Minutes Moist Heat  15 Minutes    Moist Heat Location  Cervical      Electrical Stimulation   Electrical Stimulation Location  B UT, rhomboids    Electrical Stimulation Action  IFC    Electrical Stimulation Parameters  80-'150hz'  x35mn    Electrical Stimulation Goals  Pain      Ultrasound   Ultrasound Location  bil cervical/UT    Ultrasound Parameters  15.w/cm2/50%/152m x1265m   Ultrasound Goals  Pain  Manual Therapy   Manual Therapy  Soft tissue mobilization    Soft tissue mobilization  STW/M  B UT, levator scapula, rhomboids to decrease tone and pain.               PT Short Term Goals - 09/18/19 0856      PT SHORT TERM GOAL #1   Title  Patient will be independent with HEP    Time  3    Period  Weeks    Status  Achieved   doing daily initial HEP 09/18/19       PT Long Term Goals - 09/18/19 0854      PT LONG TERM GOAL #1   Title  Patient will be independent with advanced HEP.    Baseline  no knowledge of HEP    Time  6    Period  Weeks    Status  On-going      PT LONG TERM GOAL #2   Title  Patient will report ability to peform ADLs and caregiving tasks with neck pain less than or equal to 5/10.    Baseline  8/10 pain with activities    Time  6    Period  Weeks    Status  On-going   7/10 reported 09/18/19     PT LONG TERM GOAL #3   Title  Patient will demonstrate 65 degrees of bilateral cervical rotation AROM to  improve ability to perform home tasks    Time  6    Period  Weeks    Status  On-going      PT LONG TERM GOAL #4   Title  Patient will demonstrate 150+ degrees of bilateral shoulder flexion AROM to improve overhead activities.    Baseline  140 degrees right, 135 degrees left    Time  6    Period  Weeks    Status  On-going            Plan - 09/18/19 0848    Clinical Impression Statement  Patient tolerated treatmentfair due to ongoing pain and tightness in cervical spine and UT. Patient feels treatments help yet pain returns with lifing and certain movements. Patient has palpable tightness in bil cervical area and UT today. STG met with LTG ongoing at this time.    Personal Factors and Comorbidities  Time since onset of injury/illness/exacerbation;Age    Examination-Activity Limitations  Caring for Others;Carry;Lift;Reach Overhead    Stability/Clinical Decision Making  Stable/Uncomplicated    Rehab Potential  Good    PT Frequency  2x / week    PT Duration  6 weeks    PT Treatment/Interventions  ADLs/Self Care Home Management;Cryotherapy;Electrical Stimulation;Moist Heat;Traction;Ultrasound;Neuromuscular re-education;Therapeutic exercise;Therapeutic activities;Functional mobility training;Patient/family education;Manual techniques;Passive range of motion;Spinal Manipulations;Taping    PT Next Visit Plan  cont with STW/M, ultrasound Combo to decrease pain, progress with TEs for posture and ROM, modalities PRN for pain relief       Patient will benefit from skilled therapeutic intervention in order to improve the following deficits and impairments:  Decreased activity tolerance, Decreased strength, Decreased range of motion, Difficulty walking, Pain, Postural dysfunction  Visit Diagnosis: Cervicalgia  Abnormal posture  Pain in thoracic spine  Muscle weakness (generalized)     Problem List Patient Active Problem List   Diagnosis Date Noted  . Chronic fatigue and malaise  08/14/2019  . Encounter for IUD insertion 02/28/2019  . Atypical chest pain 10/17/2017  . Hyperlipidemia 08/27/2015  . GERD (gastroesophageal reflux disease) 08/20/2015  .  Generalized anxiety disorder 07/24/2015  . Other seasonal allergic rhinitis 03/18/2015  . Chronic pain of multiple joints 03/18/2015    Bettie Capistran P, PTA 09/18/2019, 9:01 AM  [facility] [place], [place], [address] Phone: [phone]   Fax:  [phone]  Name: Catherine Haney MRN: [id number] Date of Birth: Feb 07, 1991

## 2019-09-20 ENCOUNTER — Other Ambulatory Visit: Payer: Self-pay

## 2019-09-20 ENCOUNTER — Ambulatory Visit: Payer: Medicaid Other | Admitting: Physical Therapy

## 2019-09-20 ENCOUNTER — Encounter: Payer: Self-pay | Admitting: Physical Therapy

## 2019-09-20 DIAGNOSIS — R293 Abnormal posture: Secondary | ICD-10-CM

## 2019-09-20 DIAGNOSIS — M546 Pain in thoracic spine: Secondary | ICD-10-CM | POA: Diagnosis not present

## 2019-09-20 DIAGNOSIS — M542 Cervicalgia: Secondary | ICD-10-CM | POA: Diagnosis not present

## 2019-09-20 DIAGNOSIS — M6281 Muscle weakness (generalized): Secondary | ICD-10-CM | POA: Diagnosis not present

## 2019-09-20 NOTE — Therapy (Signed)
[facility] [place], [place], [address] Phone: [phone]   Fax:  [phone]  Physical Therapy Treatment  Patient Details  Name: Catherine Haney MRN: [id number] Date of Birth: 1991-04-01 Referring Provider (PT): Ronnie Doss, DO   Encounter Date: 09/20/2019  PT End of Session - 09/20/19 0943    Visit Number  8    Number of Visits  12    Date for PT Re-Evaluation  10/08/19    Authorization Type  Medicaid 10 visits approved; 09/04/2019 - 10/08/2019    Authorization - Visit Number  3    Authorization - Number of Visits  10    PT Start Time  0901    PT Stop Time  0948    PT Time Calculation (min)  47 min    Activity Tolerance  Patient tolerated treatment well    Behavior During Therapy  [facility] for tasks assessed/performed       Past Medical History:  Diagnosis Date  . Anemia affecting third pregnancy 10/18/2018   HGB 11.7 --> 9.5 ->9.3 ->Fe++-> 10.1  . GERD (gastroesophageal reflux disease)   . PROM (premature rupture of membranes) 01/13/2019  . Supervision of normal pregnancy 07/10/2018     FAMILY TREE  LAB RESULTS Language Arabic Pap 10/19 per pt [place] PCP Initiated care at 13wk GC/CT Initial: -/-           36wks: Dating by LMP c/w 10wk U/S   Support person [employer] NT/IT:declined ONG:[id number]      MaterniT21:   [place]/HgbE neg Flu vaccine Declined 2/18 CF declined TDaP vaccine declined SMA  Rhogam n/a     Blood Type O/Positive/-- (02/18 1044) Anatomy US Normal female Antibody N    Past Surgical History:  Procedure Laterality Date  . DILATION AND CURETTAGE OF UTERUS N/A 08/25/2017   Procedure: SUCTION DILATATION AND CURETTAGE;  Surgeon: Florian Buff, MD;  Location: [facility];  Service: Gynecology;  Laterality: N/A;    There were no vitals filed for this visit.  Subjective Assessment - 09/20/19 0941    Subjective  COVID 19 screening performed on patient upon arrival. Patient arrives with ongoing cervical and upper  thoracic pain but now with some pain down the right arm.    Pertinent History  unremarkable    Limitations  House hold activities;Lifting;Other (comment)    Diagnostic tests  none    Patient Stated Goals  decrease pain and improve ability to perform home activities and care giving activities.    Currently in Pain?  Yes   did not provide number on pain scale        [facility] PT Assessment - 09/20/19 0001      Assessment   Medical Diagnosis  Muscle spasms of neck, spasm of thoracic back muscle    Referring Provider (PT)  Ronnie Doss, DO    Hand Dominance  Right    Next MD Visit  to be scheduled    Prior Therapy  yes      Precautions   Precautions  None                   [facility] Adult PT Treatment/Exercise - 09/20/19 0001      Exercises   Exercises  Shoulder      Shoulder Exercises: Standing   External Rotation  Strengthening;Right;20 reps;Theraband    Theraband Level (Shoulder External Rotation)  Level 1 (Yellow)    Internal Rotation  Strengthening;Right;20 reps;Theraband    Theraband Level (  Shoulder Internal Rotation)  Level 1 (Yellow)    Extension  Strengthening;Both;20 reps;Theraband    Theraband Level (Shoulder Extension)  Level 1 (Yellow)    Row  Strengthening;Both;20 reps;Theraband    Theraband Level (Shoulder Row)  Level 1 (Yellow)      Modalities   Modalities  Electrical Stimulation;Moist Heat;Ultrasound      Moist Heat Therapy   Number Minutes Moist Heat  15 Minutes    Moist Heat Location  Cervical      Electrical Stimulation   Electrical Stimulation Location  B UT, rhomboids    Electrical Stimulation Action  IFC    Electrical Stimulation Parameters  80-150 hz x15 mins    Electrical Stimulation Goals  Pain      Ultrasound   Ultrasound Location  R UT    Ultrasound Parameters  combo, 1.5 w/cm2, 100%,    Ultrasound Goals  Pain               PT Short Term Goals - 09/18/19 0856      PT SHORT TERM GOAL #1   Title  Patient will be  independent with HEP    Time  3    Period  Weeks    Status  Achieved   doing daily initial HEP 09/18/19       PT Long Term Goals - 09/18/19 0854      PT LONG TERM GOAL #1   Title  Patient will be independent with advanced HEP.    Baseline  no knowledge of HEP    Time  6    Period  Weeks    Status  On-going      PT LONG TERM GOAL #2   Title  Patient will report ability to peform ADLs and caregiving tasks with neck pain less than or equal to 5/10.    Baseline  8/10 pain with activities    Time  6    Period  Weeks    Status  On-going   7/10 reported 09/18/19     PT LONG TERM GOAL #3   Title  Patient will demonstrate 65 degrees of bilateral cervical rotation AROM to improve ability to perform home tasks    Time  6    Period  Weeks    Status  On-going      PT LONG TERM GOAL #4   Title  Patient will demonstrate 150+ degrees of bilateral shoulder flexion AROM to improve overhead activities.    Baseline  140 degrees right, 135 degrees left    Time  6    Period  Weeks    Status  On-going            Plan - 09/20/19 0944    Clinical Impression Statement  Patient responded well to therapy session with the addition of combo US/e-stim and theraband TEs. Patient required cuing for form and technique via demonstration, verbal cuing and tactile cuing. Patient demonstrated improved form with all TEs. Patient educated on performing HEP to tolerance and to stop if pain raises too high. Patient    Personal Factors and Comorbidities  Time since onset of injury/illness/exacerbation;Age    Examination-Activity Limitations  Caring for Others;Carry;Lift;Reach Overhead    Stability/Clinical Decision Making  Stable/Uncomplicated    Clinical Decision Making  Low    Rehab Potential  Good    PT Frequency  2x / week    PT Duration  6 weeks    PT Treatment/Interventions  ADLs/Self Care Home Management;Cryotherapy;Lobbyist  Stimulation;Moist Heat;Traction;Ultrasound;Neuromuscular  re-education;Therapeutic exercise;Therapeutic activities;Functional mobility training;Patient/family education;Manual techniques;Passive range of motion;Spinal Manipulations;Taping    PT Next Visit Plan  cont with STW/M, ultrasound Combo to decrease pain, progress with TEs for posture and ROM, modalities PRN for pain relief    PT Home Exercise Plan  see patient education section    Consulted and Agree with Plan of Care  Patient       Patient will benefit from skilled therapeutic intervention in order to improve the following deficits and impairments:  Decreased activity tolerance, Decreased strength, Decreased range of motion, Difficulty walking, Pain, Postural dysfunction  Visit Diagnosis: Cervicalgia  Abnormal posture  Pain in thoracic spine  Muscle weakness (generalized)     Problem List Patient Active Problem List   Diagnosis Date Noted  . Chronic fatigue and malaise 08/14/2019  . Encounter for IUD insertion 02/28/2019  . Atypical chest pain 10/17/2017  . Hyperlipidemia 08/27/2015  . GERD (gastroesophageal reflux disease) 08/20/2015  . Generalized anxiety disorder 07/24/2015  . Other seasonal allergic rhinitis 03/18/2015  . Chronic pain of multiple joints 03/18/2015    Guss Bunde, PT, DPT 09/20/2019, 10:34 AM  [facility]  [address] [place], [place], [address] Phone: [phone]   Fax:  [phone]  Name: Catherine Haney MRN: [id number] Date of Birth: 05-29-1990

## 2019-09-23 ENCOUNTER — Ambulatory Visit: Payer: Medicaid Other | Attending: Family Medicine | Admitting: Physical Therapy

## 2019-09-23 DIAGNOSIS — R293 Abnormal posture: Secondary | ICD-10-CM | POA: Insufficient documentation

## 2019-09-23 DIAGNOSIS — M542 Cervicalgia: Secondary | ICD-10-CM | POA: Insufficient documentation

## 2019-09-25 ENCOUNTER — Ambulatory Visit: Payer: Medicaid Other | Admitting: Adult Health

## 2019-09-26 ENCOUNTER — Encounter: Payer: Self-pay | Admitting: Physical Therapy

## 2019-09-26 ENCOUNTER — Ambulatory Visit: Payer: Medicaid Other | Admitting: Physical Therapy

## 2019-09-26 ENCOUNTER — Other Ambulatory Visit: Payer: Self-pay

## 2019-09-26 DIAGNOSIS — R293 Abnormal posture: Secondary | ICD-10-CM | POA: Diagnosis not present

## 2019-09-26 DIAGNOSIS — M542 Cervicalgia: Secondary | ICD-10-CM

## 2019-09-26 NOTE — Therapy (Signed)
[facility] [address] [place], [place], [address] Phone: [phone]   Fax:  [phone]  Physical Therapy Treatment  Patient Details  Name: Catherine Haney MRN: [id number] Date of Birth: 1990/11/19 Referring Provider (PT): Delynn Flavin, DO   Encounter Date: 09/26/2019  PT End of Session - 09/26/19 0753    Visit Number  9    Number of Visits  12    Date for PT Re-Evaluation  10/08/19    Authorization Type  Medicaid 10 visits approved; 09/04/2019 - 10/08/2019    PT Start Time  0741   late arrival   PT Stop Time  0816    PT Time Calculation (min)  35 min    Activity Tolerance  Patient tolerated treatment well    Behavior During Therapy  [facility] for tasks assessed/performed       Past Medical History:  Diagnosis Date  . Anemia affecting third pregnancy 10/18/2018   HGB 11.7 --> 9.5 ->9.3 ->Fe++-> 10.1  . GERD (gastroesophageal reflux disease)   . PROM (premature rupture of membranes) 01/13/2019  . Supervision of normal pregnancy 07/10/2018     FAMILY TREE  LAB RESULTS Language Arabic Pap 10/19 per pt [place] PCP Initiated care at 13wk GC/CT Initial: -/-           36wks: Dating by LMP c/w 10wk U/S   Support person [employer] NT/IT:declined ESP:[id number]      MaterniT21:   [place]/HgbE neg Flu vaccine Declined 2/18 CF declined TDaP vaccine declined SMA  Rhogam n/a     Blood Type O/Positive/-- (02/18 1044) Anatomy US Normal female Antibody N    Past Surgical History:  Procedure Laterality Date  . DILATION AND CURETTAGE OF UTERUS N/A 08/25/2017   Procedure: SUCTION DILATATION AND CURETTAGE;  Surgeon: Lazaro Arms, MD;  Location: [facility];  Service: Gynecology;  Laterality: N/A;    There were no vitals filed for this visit.  Subjective Assessment - 09/26/19 0742    Subjective  COVID 19 screening performed on patient upon arrival. Patient reports radicular pain continuing down RUE and 6/10 pain at arrival.    Pertinent History  unremarkable    Limitations  House hold activities;Lifting;Other (comment)    Diagnostic tests  none    Patient Stated Goals  decrease pain and improve ability to perform home activities and care giving activities.    Currently in Pain?  Yes    Pain Score  6     Pain Location  Neck    Pain Descriptors / Indicators  Discomfort    Pain Type  Chronic pain    Pain Radiating Towards  RUE    Pain Onset  More than a month ago    Pain Frequency  Constant         [facility] PT Assessment - 09/26/19 0001      Assessment   Medical Diagnosis  Muscle spasms of neck, spasm of thoracic back muscle    Referring Provider (PT)  Delynn Flavin, DO    Hand Dominance  Right    Next MD Visit  to be scheduled    Prior Therapy  yes      Precautions   Precautions  None                   [facility] Adult PT Treatment/Exercise - 09/26/19 0001      Shoulder Exercises: Standing   External Rotation  Strengthening;Right;20 reps;Theraband    Theraband Level (Shoulder External Rotation)  Level 1 (Yellow)    Extension  Strengthening;Both;15 reps;Limitations    Extension Limitations  Green XTS    Row  Strengthening;Both;15 reps;Limitations    Row Limitations  Green XTS    Other Standing Exercises  Snow angels x10 reps    Other Standing Exercises  W back x10 reps      Shoulder Exercises: ROM/Strengthening   UBE (Upper Arm Bike)  90 RPM x6 min (forward/backward for posture)      Modalities   Modalities  Traction      Traction   Type of Traction  Cervical    Min (lbs)  5    Max (lbs)  13    Hold Time  99    Rest Time  5    Time  15               PT Short Term Goals - 09/18/19 0856      PT SHORT TERM GOAL #1   Title  Patient will be independent with HEP    Time  3    Period  Weeks    Status  Achieved   doing daily initial HEP 09/18/19       PT Long Term Goals - 09/18/19 0854      PT LONG TERM GOAL #1   Title  Patient will be independent with advanced HEP.    Baseline  no knowledge of HEP     Time  6    Period  Weeks    Status  On-going      PT LONG TERM GOAL #2   Title  Patient will report ability to peform ADLs and caregiving tasks with neck pain less than or equal to 5/10.    Baseline  8/10 pain with activities    Time  6    Period  Weeks    Status  On-going   7/10 reported 09/18/19     PT LONG TERM GOAL #3   Title  Patient will demonstrate 65 degrees of bilateral cervical rotation AROM to improve ability to perform home tasks    Time  6    Period  Weeks    Status  On-going      PT LONG TERM GOAL #4   Title  Patient will demonstrate 150+ degrees of bilateral shoulder flexion AROM to improve overhead activities.    Baseline  140 degrees right, 135 degrees left    Time  6    Period  Weeks    Status  On-going            Plan - 09/26/19 1751    Clinical Impression Statement  Patient presented in clinic with reports of continued RUE radicular symptoms and neck pain. Patient guided through postural strengthening with focus of cueing on proper posture and technique. Patient observed completing therex with more fatigue of postural muscles. Mechanical cervical traction also intiated today due to RUE symptoms. No complaints of pain or discomfort on traction during or after session. Patient educated to continue HEP and monitor her condition following traction.    Personal Factors and Comorbidities  Time since onset of injury/illness/exacerbation;Age    Examination-Activity Limitations  Caring for Others;Carry;Lift;Reach Overhead    Stability/Clinical Decision Making  Stable/Uncomplicated    Rehab Potential  Good    PT Frequency  2x / week    PT Duration  6 weeks    PT Treatment/Interventions  ADLs/Self Care Home Management;Cryotherapy;Electrical Stimulation;Moist Heat;Traction;Ultrasound;Neuromuscular re-education;Therapeutic exercise;Therapeutic activities;Functional mobility training;Patient/family education;Manual techniques;Passive  range of motion;Spinal  Manipulations;Taping    PT Next Visit Plan  Monitor symptoms following mechanical cervical traction.    PT Home Exercise Plan  see patient education section    Consulted and Agree with Plan of Care  Patient       Patient will benefit from skilled therapeutic intervention in order to improve the following deficits and impairments:  Decreased activity tolerance, Decreased strength, Decreased range of motion, Difficulty walking, Pain, Postural dysfunction  Visit Diagnosis: Cervicalgia  Abnormal posture     Problem List Patient Active Problem List   Diagnosis Date Noted  . Chronic fatigue and malaise 08/14/2019  . Encounter for IUD insertion 02/28/2019  . Atypical chest pain 10/17/2017  . Hyperlipidemia 08/27/2015  . GERD (gastroesophageal reflux disease) 08/20/2015  . Generalized anxiety disorder 07/24/2015  . Other seasonal allergic rhinitis 03/18/2015  . Chronic pain of multiple joints 03/18/2015    Standley Brooking, PTA 09/26/2019, 8:22 AM  [facility] [place], [place], [address] Phone: [phone]   Fax:  [phone]  Name: Catherine Haney MRN: [id number] Date of Birth: September 13, 1990

## 2019-09-27 ENCOUNTER — Encounter: Payer: Medicaid Other | Admitting: Physical Therapy

## 2019-10-01 ENCOUNTER — Ambulatory Visit: Payer: Medicaid Other | Admitting: Physical Therapy

## 2019-10-01 ENCOUNTER — Other Ambulatory Visit: Payer: Self-pay

## 2019-10-01 ENCOUNTER — Encounter: Payer: Self-pay | Admitting: Physical Therapy

## 2019-10-01 DIAGNOSIS — R293 Abnormal posture: Secondary | ICD-10-CM

## 2019-10-01 DIAGNOSIS — M542 Cervicalgia: Secondary | ICD-10-CM

## 2019-10-01 NOTE — Therapy (Signed)
[facility] [place], [place], [address] Phone: [phone]   Fax:  [phone]  Physical Therapy Treatment Progress Note Reporting Period 07/16/2019 to 10/01/2019  See note below for Objective Data and Assessment of Progress/Goals. Patient with improvements with neurological symptoms. Gabriela Eves, PT, DPT     Patient Details  Name: Catherine Haney MRN: [id number] Date of Birth: [birth year] Referring Provider (PT): Ronnie Doss, DO   Encounter Date: 10/01/2019  PT End of Session - 10/01/19 0743    Visit Number  10    Number of Visits  12    Date for PT Re-Evaluation  10/08/19    Authorization Type  Medicaid 10 visits approved; 09/04/2019 - 10/08/2019    PT Start Time  0737    PT Stop Time  0819    PT Time Calculation (min)  42 min    Activity Tolerance  Patient tolerated treatment well    Behavior During Therapy  [facility] for tasks assessed/performed       Past Medical History:  Diagnosis Date  . Anemia affecting third pregnancy 10/18/2018   HGB 11.7 --> 9.5 ->9.3 ->Fe++-> 10.1  . GERD (gastroesophageal reflux disease)   . PROM (premature rupture of membranes) 01/13/2019  . Supervision of normal pregnancy 07/10/2018     FAMILY TREE  LAB RESULTS Language Arabic Pap 10/19 per pt [place] PCP Initiated care at 13wk GC/CT Initial: -/-           36wks: Dating by LMP c/w 10wk U/S   Support person [employer] NT/IT:declined HXT:[id number]      MaterniT21:   [place]/HgbE neg Flu vaccine Declined 2/18 CF declined TDaP vaccine declined SMA  Rhogam n/a     Blood Type O/Positive/-- (02/18 1044) Anatomy US Normal female Antibody N    Past Surgical History:  Procedure Laterality Date  . DILATION AND CURETTAGE OF UTERUS N/A 08/25/2017   Procedure: SUCTION DILATATION AND CURETTAGE;  Surgeon: Florian Buff, MD;  Location: [facility];  Service: Gynecology;  Laterality: N/A;    There were no vitals filed for this visit.  Subjective Assessment -  10/01/19 0743    Subjective  COVID 19 screening performed on patient upon arrival. Reports she tolerated traction well and only has a little radicular pain down RUE.    Pertinent History  unremarkable    Limitations  House hold activities;Lifting;Other (comment)    Diagnostic tests  none    Patient Stated Goals  decrease pain and improve ability to perform home activities and care giving activities.    Currently in Pain?  No/denies         [facility] PT Assessment - 10/01/19 0001      Assessment   Medical Diagnosis  Muscle spasms of neck, spasm of thoracic back muscle    Referring Provider (PT)  Ronnie Doss, DO    Hand Dominance  Right    Next MD Visit  to be scheduled    Prior Therapy  yes      Precautions   Precautions  None      ROM / Strength   AROM / PROM / Strength  AROM      AROM   Overall AROM   Within functional limits for tasks performed    AROM Assessment Site  Shoulder;Cervical    Right/Left Shoulder  Right;Left    Right Shoulder Flexion  147 Degrees    Left Shoulder Flexion  150 Degrees    Cervical - Right Rotation  67    Cervical - Left Rotation  85                   [facility] Adult PT Treatment/Exercise - 10/01/19 0001      Shoulder Exercises: Standing   Horizontal ABduction  Strengthening;Both;20 reps;Theraband    Theraband Level (Shoulder Horizontal ABduction)  Level 1 (Yellow)    External Rotation  Strengthening;Both;20 reps;Theraband    Theraband Level (Shoulder External Rotation)  Level 1 (Yellow)    Extension  Strengthening;Both;Limitations;20 reps    Extension Limitations  Green XTS    Row  Strengthening;Both;Limitations;20 reps    Row Limitations  Green XTS    Other Standing Exercises  W back x20 reps      Shoulder Exercises: ROM/Strengthening   UBE (Upper Arm Bike)  90 RPM x6 min (forward/backward for posture)    Wall Pushups  15 reps    X to V Arms  x15 reps      Modalities   Modalities  Traction      Traction   Type of  Traction  Cervical    Min (lbs)  5    Max (lbs)  15    Hold Time  99    Rest Time  5    Time  15               PT Short Term Goals - 09/18/19 0856      PT SHORT TERM GOAL #1   Title  Patient will be independent with HEP    Time  3    Period  Weeks    Status  Achieved   doing daily initial HEP 09/18/19       PT Long Term Goals - 10/01/19 0753      PT LONG TERM GOAL #1   Title  Patient will be independent with advanced HEP.    Baseline  no knowledge of HEP    Time  6    Period  Weeks    Status  On-going      PT LONG TERM GOAL #2   Title  Patient will report ability to peform ADLs and caregiving tasks with neck pain less than or equal to 5/10.    Baseline  8/10 pain with activities    Time  6    Period  Weeks    Status  Achieved   7/10 reported 09/18/19     PT LONG TERM GOAL #3   Title  Patient will demonstrate 65 degrees of bilateral cervical rotation AROM to improve ability to perform home tasks    Time  6    Period  Weeks    Status  Achieved      PT LONG TERM GOAL #4   Title  Patient will demonstrate 150+ degrees of bilateral shoulder flexion AROM to improve overhead activities.    Baseline  140 degrees right, 135 degrees left    Time  6    Period  Weeks    Status  Partially Met            Plan - 10/01/19 0754    Clinical Impression Statement  Patient presented in clinic with reports of less RUE radicular symptoms since mechanical traction session in previous treatment. Patient continued guided progression through postural strengthening with only report of UE fatigue. Mod VCs required for postural correction during therex. All LTGs were assessed with positive progression noted with all AROM measurements and pain with ADLs. Mechanical cervical traction  increased to 15# max today to continue reducing radicular symptoms.    Personal Factors and Comorbidities  Time since onset of injury/illness/exacerbation;Age    Examination-Activity Limitations  Caring  for Others;Carry;Lift;Reach Overhead    Stability/Clinical Decision Making  Stable/Uncomplicated    Rehab Potential  Good    PT Frequency  2x / week    PT Duration  6 weeks    PT Treatment/Interventions  ADLs/Self Care Home Management;Cryotherapy;Electrical Stimulation;Moist Heat;Traction;Ultrasound;Neuromuscular re-education;Therapeutic exercise;Therapeutic activities;Functional mobility training;Patient/family education;Manual techniques;Passive range of motion;Spinal Manipulations;Taping    PT Next Visit Plan  Monitor symptoms following mechanical cervical traction.    PT Home Exercise Plan  see patient education section    Consulted and Agree with Plan of Care  Patient       Patient will benefit from skilled therapeutic intervention in order to improve the following deficits and impairments:  Decreased activity tolerance, Decreased strength, Decreased range of motion, Difficulty walking, Pain, Postural dysfunction  Visit Diagnosis: Cervicalgia  Abnormal posture     Problem List Patient Active Problem List   Diagnosis Date Noted  . Chronic fatigue and malaise 08/14/2019  . Encounter for IUD insertion 02/28/2019  . Atypical chest pain 10/17/2017  . Hyperlipidemia 08/27/2015  . GERD (gastroesophageal reflux disease) 08/20/2015  . Generalized anxiety disorder 07/24/2015  . Other seasonal allergic rhinitis 03/18/2015  . Chronic pain of multiple joints 03/18/2015   Standley Brooking, PTA 10/01/19 8:22 AM   [facility] [place], [place], [address] Phone: [phone]   Fax:  [phone]  Name: Catherine Haney MRN: [id number] Date of Birth: 1990/08/27

## 2019-10-02 ENCOUNTER — Encounter: Payer: Self-pay | Admitting: Physical Therapy

## 2019-10-02 ENCOUNTER — Ambulatory Visit: Payer: Medicaid Other | Admitting: Physical Therapy

## 2019-10-02 DIAGNOSIS — R293 Abnormal posture: Secondary | ICD-10-CM | POA: Diagnosis not present

## 2019-10-02 DIAGNOSIS — M542 Cervicalgia: Secondary | ICD-10-CM | POA: Diagnosis not present

## 2019-10-02 NOTE — Therapy (Signed)
[facility] [place], [place], [address] Phone: [phone]   Fax:  [phone]  Physical Therapy Treatment  Patient Details  Name: Catherine Haney MRN: [id number] Date of Birth: 24-Mar-1991 Referring Provider (PT): Ronnie Doss, DO   Encounter Date: 10/02/2019  PT End of Session - 10/02/19 0818    Visit Number  11    Number of Visits  12    Date for PT Re-Evaluation  10/08/19    Authorization Type  Medicaid 10 visits approved; 09/04/2019 - 10/08/2019    PT Start Time  0816    PT Stop Time  0901    PT Time Calculation (min)  45 min    Activity Tolerance  Patient tolerated treatment well    Behavior During Therapy  [facility] for tasks assessed/performed       Past Medical History:  Diagnosis Date  . Anemia affecting third pregnancy 10/18/2018   HGB 11.7 --> 9.5 ->9.3 ->Fe++-> 10.1  . GERD (gastroesophageal reflux disease)   . PROM (premature rupture of membranes) 01/13/2019  . Supervision of normal pregnancy 07/10/2018     FAMILY TREE  LAB RESULTS Language Arabic Pap 10/19 per pt [place] PCP Initiated care at 13wk GC/CT Initial: -/-           36wks: Dating by LMP c/w 10wk U/S   Support person [employer] NT/IT:declined ONG:[id number]      MaterniT21:   [place]/HgbE neg Flu vaccine Declined 2/18 CF declined TDaP vaccine declined SMA  Rhogam n/a     Blood Type O/Positive/-- (02/18 1044) Anatomy US Normal female Antibody N    Past Surgical History:  Procedure Laterality Date  . DILATION AND CURETTAGE OF UTERUS N/A 08/25/2017   Procedure: SUCTION DILATATION AND CURETTAGE;  Surgeon: Florian Buff, MD;  Location: [facility];  Service: Gynecology;  Laterality: N/A;    There were no vitals filed for this visit.  Subjective Assessment - 10/02/19 0817    Subjective  COVID 19 screening performed on patient upon arrival. Reports she has a lot going on right now and has soreness.    Pertinent History  unremarkable    Limitations  House hold  activities;Lifting;Other (comment)    Diagnostic tests  none    Patient Stated Goals  decrease pain and improve ability to perform home activities and care giving activities.    Currently in Pain?  Yes    Pain Score  6     Pain Location  Neck    Pain Orientation  Right;Left    Pain Descriptors / Indicators  Sore    Pain Type  Chronic pain    Pain Onset  More than a month ago    Pain Frequency  Constant         [facility] PT Assessment - 10/02/19 0001      Assessment   Medical Diagnosis  Muscle spasms of neck, spasm of thoracic back muscle    Referring Provider (PT)  Ronnie Doss, DO    Hand Dominance  Right    Next MD Visit  to be scheduled    Prior Therapy  yes      Precautions   Precautions  None                   [facility] Adult PT Treatment/Exercise - 10/02/19 0001      Shoulder Exercises: Standing   Horizontal ABduction  Strengthening;Both;20 reps;Theraband    Theraband Level (Shoulder Horizontal ABduction)  Level 1 (Yellow)  External Rotation  Strengthening;Both;20 reps;Theraband    Theraband Level (Shoulder External Rotation)  Level 1 (Yellow)    ABduction  AROM;Both;15 reps    Extension  Strengthening;Both;Limitations;20 reps    Extension Limitations  Green XTS      Shoulder Exercises: ROM/Strengthening   UBE (Upper Arm Bike)  90 RPM x8 min (forward/backward for posture)    Wall Pushups  15 reps      Modalities   Modalities  Traction      Traction   Type of Traction  Cervical    Min (lbs)  5    Max (lbs)  16    Hold Time  99    Rest Time  5    Time  15      Manual Therapy   Manual Therapy  Soft tissue mobilization    Soft tissue mobilization  STW to B UT to reduce tone and pain               PT Short Term Goals - 09/18/19 0856      PT SHORT TERM GOAL #1   Title  Patient will be independent with HEP    Time  3    Period  Weeks    Status  Achieved   doing daily initial HEP 09/18/19       PT Long Term Goals - 10/01/19 0753       PT LONG TERM GOAL #1   Title  Patient will be independent with advanced HEP.    Baseline  no knowledge of HEP    Time  6    Period  Weeks    Status  On-going      PT LONG TERM GOAL #2   Title  Patient will report ability to peform ADLs and caregiving tasks with neck pain less than or equal to 5/10.    Baseline  8/10 pain with activities    Time  6    Period  Weeks    Status  Achieved   7/10 reported 09/18/19     PT LONG TERM GOAL #3   Title  Patient will demonstrate 65 degrees of bilateral cervical rotation AROM to improve ability to perform home tasks    Time  6    Period  Weeks    Status  Achieved      PT LONG TERM GOAL #4   Title  Patient will demonstrate 150+ degrees of bilateral shoulder flexion AROM to improve overhead activities.    Baseline  140 degrees right, 135 degrees left    Time  6    Period  Weeks    Status  Partially Met            Plan - 10/02/19 0856    Clinical Impression Statement  Patient presented in clinic with reports of soreness in B UT region but improved radicular symptoms in RUE. Patient guided through more postural strengthening with VCs and demo required for technique correction intermittantly. Increased tone palpable in B UT and intermittant TPs as well. Normal traction response noted at end of the session.    Personal Factors and Comorbidities  Time since onset of injury/illness/exacerbation;Age    Examination-Activity Limitations  Caring for Others;Carry;Lift;Reach Overhead    Stability/Clinical Decision Making  Stable/Uncomplicated    Rehab Potential  Good    PT Frequency  2x / week    PT Duration  6 weeks    PT Treatment/Interventions  ADLs/Self Care Home Management;Cryotherapy;Electrical Stimulation;Moist Heat;Traction;Ultrasound;Neuromuscular re-education;Therapeutic  exercise;Therapeutic activities;Functional mobility training;Patient/family education;Manual techniques;Passive range of motion;Spinal Manipulations;Taping    PT Next  Visit Plan  Monitor symptoms following mechanical cervical traction.    PT Home Exercise Plan  see patient education section    Consulted and Agree with Plan of Care  Patient       Patient will benefit from skilled therapeutic intervention in order to improve the following deficits and impairments:  Decreased activity tolerance, Decreased strength, Decreased range of motion, Difficulty walking, Pain, Postural dysfunction  Visit Diagnosis: Cervicalgia  Abnormal posture     Problem List Patient Active Problem List   Diagnosis Date Noted  . Chronic fatigue and malaise 08/14/2019  . Encounter for IUD insertion 02/28/2019  . Atypical chest pain 10/17/2017  . Hyperlipidemia 08/27/2015  . GERD (gastroesophageal reflux disease) 08/20/2015  . Generalized anxiety disorder 07/24/2015  . Other seasonal allergic rhinitis 03/18/2015  . Chronic pain of multiple joints 03/18/2015    Standley Brooking, PTA 10/02/2019, 9:36 AM  [facility] [address] [place], [place], [address] Phone: [phone]   Fax:  [phone]  Name: Celest Reitz MRN: [id number] Date of Birth: Aug 24, 1990

## 2019-10-07 ENCOUNTER — Encounter: Payer: Self-pay | Admitting: Physical Therapy

## 2019-10-07 ENCOUNTER — Ambulatory Visit: Payer: Medicaid Other | Admitting: Physical Therapy

## 2019-10-07 ENCOUNTER — Other Ambulatory Visit: Payer: Self-pay

## 2019-10-07 DIAGNOSIS — M542 Cervicalgia: Secondary | ICD-10-CM | POA: Diagnosis not present

## 2019-10-07 DIAGNOSIS — R293 Abnormal posture: Secondary | ICD-10-CM

## 2019-10-07 NOTE — Therapy (Signed)
[facility] [place], [place], [address] Phone: [phone]   Fax:  [phone]  Physical Therapy Treatment  PHYSICAL THERAPY DISCHARGE SUMMARY  Visits from Start of Care: 12  Current functional level related to goals / functional outcomes: See below   Remaining deficits: See goals   Education / Equipment: HEP Plan: Patient agrees to discharge.  Patient goals were partially met. Patient is being discharged due to meeting the stated rehab goals.  ?????      Patient Details  Name: Catherine Haney MRN: [id number] Date of Birth: 03-13-1991 Referring Provider (PT): Ronnie Doss, DO   Encounter Date: 10/07/2019  PT End of Session - 10/07/19 0818    Visit Number  12    Number of Visits  12    Date for PT Re-Evaluation  10/08/19    Authorization Type  Medicaid 10 visits approved; 09/04/2019 - 10/08/2019    PT Start Time  0815    PT Stop Time  0901    PT Time Calculation (min)  46 min    Activity Tolerance  Patient tolerated treatment well    Behavior During Therapy  [facility] for tasks assessed/performed       Past Medical History:  Diagnosis Date  . Anemia affecting third pregnancy 10/18/2018   HGB 11.7 --> 9.5 ->9.3 ->Fe++-> 10.1  . GERD (gastroesophageal reflux disease)   . PROM (premature rupture of membranes) 01/13/2019  . Supervision of normal pregnancy 07/10/2018     FAMILY TREE  LAB RESULTS Language Arabic Pap 10/19 per pt [place] PCP Initiated care at 13wk GC/CT Initial: -/-           36wks: Dating by LMP c/w 10wk U/S   Support person [employer] NT/IT:declined PQZ:[id number]      MaterniT21:   [place]/HgbE neg Flu vaccine Declined 2/18 CF declined TDaP vaccine declined SMA  Rhogam n/a     Blood Type O/Positive/-- (02/18 1044) Anatomy US Normal female Antibody N    Past Surgical History:  Procedure Laterality Date  . DILATION AND CURETTAGE OF UTERUS N/A 08/25/2017   Procedure: SUCTION DILATATION AND CURETTAGE;  Surgeon:  Florian Buff, MD;  Location: [facility];  Service: Gynecology;  Laterality: N/A;    There were no vitals filed for this visit.  Subjective Assessment - 10/07/19 0818    Subjective  COVID 19 screening performed on patient upon arrival. Reports she has a lot of stress right now and still has soreness in shoulders.    Pertinent History  unremarkable    Limitations  House hold activities;Lifting;Other (comment)    Diagnostic tests  none    Patient Stated Goals  decrease pain and improve ability to perform home activities and care giving activities.    Currently in Pain?  Yes    Pain Score  --   No pain score provided   Pain Location  Neck    Pain Orientation  Right;Left    Pain Descriptors / Indicators  Sore    Pain Type  Chronic pain    Pain Onset  More than a month ago    Pain Frequency  Intermittent         [facility] PT Assessment - 10/07/19 0001      Assessment   Medical Diagnosis  Muscle spasms of neck, spasm of thoracic back muscle    Referring Provider (PT)  Ronnie Doss, DO    Hand Dominance  Right    Next MD Visit  to be  scheduled    Prior Therapy  yes      Precautions   Precautions  None      ROM / Strength   AROM / PROM / Strength  AROM      AROM   Overall AROM   Within functional limits for tasks performed    AROM Assessment Site  Shoulder    Right/Left Shoulder  Right    Right Shoulder Flexion  148 Degrees                    [facility] Adult PT Treatment/Exercise - 10/07/19 0001      Shoulder Exercises: Standing   Horizontal ABduction  Strengthening;Both;20 reps;Theraband    Theraband Level (Shoulder Horizontal ABduction)  Level 1 (Yellow)    External Rotation  Strengthening;Both;20 reps;Theraband    Theraband Level (Shoulder External Rotation)  Level 1 (Yellow)    Extension  Strengthening;Both;Limitations;20 reps    Extension Limitations  Green XTS    Row  Strengthening;Both;Limitations;20 reps    Row Limitations  Green XTS      Shoulder  Exercises: ROM/Strengthening   UBE (Upper Arm Bike)  90 RPM x8 min (forward/backward for posture)      Modalities   Modalities  Electrical Stimulation;Moist Heat      Moist Heat Therapy   Number Minutes Moist Heat  15 Minutes    Moist Heat Location  Cervical      Electrical Stimulation   Electrical Stimulation Location  B UT, rhomboids    Electrical Stimulation Action  IFC    Electrical Stimulation Parameters  80-150 hz x15 min    Electrical Stimulation Goals  Pain;Tone               PT Short Term Goals - 09/18/19 0856      PT SHORT TERM GOAL #1   Title  Patient will be independent with HEP    Time  3    Period  Weeks    Status  Achieved   doing daily initial HEP 09/18/19       PT Long Term Goals - 10/07/19 0843      PT LONG TERM GOAL #1   Title  Patient will be independent with advanced HEP.    Baseline  no knowledge of HEP    Time  6    Period  Weeks    Status  Unable to assess      PT LONG TERM GOAL #2   Title  Patient will report ability to peform ADLs and caregiving tasks with neck pain less than or equal to 5/10.    Baseline  8/10 pain with activities    Time  6    Period  Weeks    Status  Achieved   7/10 reported 09/18/19     PT LONG TERM GOAL #3   Title  Patient will demonstrate 65 degrees of bilateral cervical rotation AROM to improve ability to perform home tasks    Time  6    Period  Weeks    Status  Achieved      PT LONG TERM GOAL #4   Title  Patient will demonstrate 150+ degrees of bilateral shoulder flexion AROM to improve overhead activities.    Baseline  140 degrees right, 135 degrees left    Time  6    Period  Weeks    Status  Partially Met            Plan - 10/07/19 7672  Clinical Impression Statement  Patient presented in clinic with reports of some improvement regarding cervical pain. Patient guided through lightly resisted postural strengthening with only reports of shoulder fatigue. Patient presented with increased tone  of B UT but R > L. Met all goals except for advanced HEP goal. B functional shoulder ROM noted with measurements. Normal modalities response noted following removal of the modalities.    Personal Factors and Comorbidities  Time since onset of injury/illness/exacerbation;Age    Examination-Activity Limitations  Caring for Others;Carry;Lift;Reach Overhead    Stability/Clinical Decision Making  Stable/Uncomplicated    Rehab Potential  Good    PT Frequency  2x / week    PT Duration  6 weeks    PT Treatment/Interventions  ADLs/Self Care Home Management;Cryotherapy;Electrical Stimulation;Moist Heat;Traction;Ultrasound;Neuromuscular re-education;Therapeutic exercise;Therapeutic activities;Functional mobility training;Patient/family education;Manual techniques;Passive range of motion;Spinal Manipulations;Taping    PT Next Visit Plan  D/ C summary required.    PT Home Exercise Plan  see patient education section    Consulted and Agree with Plan of Care  Patient       Patient will benefit from skilled therapeutic intervention in order to improve the following deficits and impairments:  Decreased activity tolerance, Decreased strength, Decreased range of motion, Difficulty walking, Pain, Postural dysfunction  Visit Diagnosis: Cervicalgia  Abnormal posture     Problem List Patient Active Problem List   Diagnosis Date Noted  . Chronic fatigue and malaise 08/14/2019  . Encounter for IUD insertion 02/28/2019  . Atypical chest pain 10/17/2017  . Hyperlipidemia 08/27/2015  . GERD (gastroesophageal reflux disease) 08/20/2015  . Generalized anxiety disorder 07/24/2015  . Other seasonal allergic rhinitis 03/18/2015  . Chronic pain of multiple joints 03/18/2015   Standley Brooking, PTA 10/07/19 9:11 AM   [facility] [place], [place], [address] Phone: [phone]   Fax:  [phone]  Name: Lowanda Cashaw MRN: [id number] Date of Birth:  [birth year]

## 2019-10-10 ENCOUNTER — Encounter: Payer: Self-pay | Admitting: Adult Health

## 2019-10-10 ENCOUNTER — Ambulatory Visit: Payer: Medicaid Other | Admitting: Adult Health

## 2019-10-10 VITALS — BP 109/74 | HR 68 | Ht 62.0 in | Wt 138.0 lb

## 2019-10-10 DIAGNOSIS — Z975 Presence of (intrauterine) contraceptive device: Secondary | ICD-10-CM | POA: Diagnosis not present

## 2019-10-10 DIAGNOSIS — N926 Irregular menstruation, unspecified: Secondary | ICD-10-CM

## 2019-10-10 DIAGNOSIS — R102 Pelvic and perineal pain: Secondary | ICD-10-CM

## 2019-10-10 DIAGNOSIS — N920 Excessive and frequent menstruation with regular cycle: Secondary | ICD-10-CM | POA: Diagnosis not present

## 2019-10-10 DIAGNOSIS — N941 Unspecified dyspareunia: Secondary | ICD-10-CM | POA: Diagnosis not present

## 2019-10-10 NOTE — Progress Notes (Addendum)
  Subjective:     Patient ID: Catherine Haney, female   DOB: May 14, 1991, 29 y.o.   MRN: [id number]  HPI Catherine Haney is a 29 year old biracial female, married, [id number] in complaining of period for 2 weeks every month and pain with sex and low pelvic pain for 7 months since getting IUD. Did not have interrupter today, but she seemed to understand well.  PCP is Dr Dettinger   Review of Systems Periods lasting 2 weeks,has clots +Pain with sex Denies any odor  Has low pelvic for last 7 months since getting IUD, 02/28/2019 She can feel strings Reviewed past medical,surgical, social and family history. Reviewed medications and allergies.     Objective:   Physical Exam BP 109/74 (BP Location: Left Arm, Patient Position: Sitting, Cuff Size: Normal)   Pulse 68   Ht 5\' 2"  (1.575 m)   Wt 138 lb (62.6 kg)   BMI 25.24 kg/m  Skin warm and dry. Lungs: clear to ausculation bilaterally. Cardiovascular: regular rate and rhythm. Abdomen is soft and non tender no HSM, is tender down low over uterus.     Assessment:     1. Prolonged periods Has period for 2 weeks, with IUD, will get to assess uterus and IUD, may prescribe megace,if [place] normal  2. Dyspareunia, female   3. IUD (intrauterine device) in place Will get [place] to check placement   4. Pelvic pain Will get GYN [place] to assess uterus and ovaries and IUD placement     Plan:     She requests next Friday for Friday, and will get [place] to see after Selena Batten to go over results and prescribe megace if normal

## 2019-10-10 NOTE — Addendum Note (Signed)
Addended by: Cyril Mourning A on: 10/10/2019 09:48 AM   Modules accepted: Level of Service

## 2019-10-14 ENCOUNTER — Telehealth: Payer: Self-pay | Admitting: "Endocrinology

## 2019-10-14 NOTE — Telephone Encounter (Signed)
Can you call this pt's pharmacy? She is saying she does not have any levothyroxine (SYNTHROID) 50 MCG tablet but 90 day supply was called in on 4/21.

## 2019-10-14 NOTE — Telephone Encounter (Signed)
Called [facility], they stated they did receive the rx for levothyroxine but pt did not pick it up. Called pt advised her the pharmacy has her rx and it is ready to be picked up, understanding voiced.

## 2019-10-18 ENCOUNTER — Ambulatory Visit (INDEPENDENT_AMBULATORY_CARE_PROVIDER_SITE_OTHER): Payer: Medicaid Other

## 2019-10-18 ENCOUNTER — Ambulatory Visit (INDEPENDENT_AMBULATORY_CARE_PROVIDER_SITE_OTHER): Payer: Medicaid Other | Admitting: Women's Health

## 2019-10-18 ENCOUNTER — Encounter: Payer: Self-pay | Admitting: Women's Health

## 2019-10-18 VITALS — BP 112/70 | HR 82 | Ht 62.0 in | Wt 139.0 lb

## 2019-10-18 DIAGNOSIS — R102 Pelvic and perineal pain unspecified side: Secondary | ICD-10-CM

## 2019-10-18 DIAGNOSIS — N926 Irregular menstruation, unspecified: Secondary | ICD-10-CM | POA: Diagnosis not present

## 2019-10-18 DIAGNOSIS — N946 Dysmenorrhea, unspecified: Secondary | ICD-10-CM

## 2019-10-18 DIAGNOSIS — N941 Unspecified dyspareunia: Secondary | ICD-10-CM

## 2019-10-18 DIAGNOSIS — N921 Excessive and frequent menstruation with irregular cycle: Secondary | ICD-10-CM

## 2019-10-18 DIAGNOSIS — Z975 Presence of (intrauterine) contraceptive device: Secondary | ICD-10-CM

## 2019-10-18 MED ORDER — MEGESTROL ACETATE 40 MG PO TABS
ORAL_TABLET | ORAL | 1 refills | Status: DC
Start: 2019-10-18 — End: 2019-11-11

## 2019-10-18 NOTE — Progress Notes (Signed)
PELVIC US TA/TV: homogeneous anteverted uterus,wnl,normal ovaries,EEC 3.3 mm,IUD is centrally located within the endometrium,no free fluid,ovaries appear mobile,pelvic pain during ultrasound

## 2019-10-18 NOTE — Progress Notes (Signed)
GYN VISIT Patient name: Catherine Haney MRN [id number]  Date of birth: 1991/03/30 Chief Complaint:   [place] Results  History of Present Illness:   Catherine Haney is a 29 y.o. [phone] female being seen today for for f/u after u/s done for frequent periods and cramping.  Had Liletta IUD inserted 02/28/19 and has had these problems since. She saw Catherine Haney on 5/20 and u/s was ordered and she discussed w/ pt if u/s normal would try megace.   Today's u/s:  Catherine Haney is a 29 y.o. [id number] No LMP recorded. (Menstrual status: IUD).She is here for a pelvic sonogram for pelvic pain,irregular periods with IUD. Uterus                      6.1 x 4.4 x 5.9 cm, Total uterine volume 82 cc, homogeneous anteverted uterus,wnl Endometrium          3.3 mm, symmetrical, IUD is centrally located within the endometrium Right ovary             4.5 x 2.4 x 3 cm, WNL Left ovary                2.4 x 3.1 x 2.1 cm, WNL No free fluid   Technician Comments: PELVIC US TA/TV: homogeneous anteverted uterus,wnl,normal ovaries,EEC 3.3 mm,IUD is centrally located within the endometrium,no free fluid,ovaries appear mobile,pelvic pain during ultrasound  [employer] 10/18/2019 9:39 AM Depression screen [facility] 2/9 12/05/2018 07/25/2018 07/10/2018 06/25/2018 05/28/2018  Decreased Interest 0 0 0 0 0  Down, Depressed, Hopeless 0 1 1 0 0  PHQ - 2 Score 0 1 1 0 0  Altered sleeping - - 1 - -  Tired, decreased energy - - 0 - -  Change in appetite - - 1 - -  Feeling bad or failure about yourself  - - 0 - -  Trouble concentrating - - 0 - -  Moving slowly or fidgety/restless - - 0 - -  Suicidal thoughts - - 0 - -  PHQ-9 Score - - 3 - -  Difficult doing work/chores - - - - -  Some recent data might be hidden    No LMP recorded. (Menstrual status: IUD). The current method of family planning is IUD.  Last pap 2017. Results were:  normal Review of Systems:   Pertinent items are noted in HPI Denies fever/chills, dizziness, headaches, visual  disturbances, fatigue, shortness of breath, chest pain, abdominal pain, vomiting, abnormal vaginal discharge/itching/odor/irritation, problems with periods, bowel movements, urination, or intercourse unless otherwise stated above.  Pertinent History Reviewed:  Reviewed past medical,surgical, social, obstetrical and family history.  Reviewed problem list, medications and allergies. Physical Assessment:   Vitals:   10/18/19 0934  BP: 112/70  Pulse: 82  Weight: 139 lb (63 kg)  Height: 5\' 2"  (1.575 m)  Body mass index is 25.42 kg/m.       Physical Examination:   General appearance: alert, well appearing, and in no distress  Mental status: alert, oriented to person, place, and time  Skin: warm & dry   Cardiovascular: normal heart rate noted  Respiratory: normal respiratory effort, no distress  Abdomen: soft, non-tender   Pelvic: examination not indicated  Extremities: no edema   Chaperone: n/a    No results found for this or any previous visit (from the past 24 hour(s)).  Assessment & Plan:  1) Frequent painful periods w/ Liletta IUD> IUD in place, rx megace   2) Past due for  pap>schedule today  Meds:  Meds ordered this encounter  Medications  . megestrol (MEGACE) 40 MG tablet    Sig: 3x5d, 2x5d, then 1 daily to help control vaginal bleeding. Stop taking when bleeding stops.    Dispense:  45 tablet    Refill:  1    Order Specific Question:   Supervising Provider    Answer:   Elonda Husky, Catherine Haney [[id number]]    No orders of the defined types were placed in this encounter.   Return in about 4 weeks (around 11/15/2019) for Pap & physical.  Catherine Haney CNM, [facility] 10/18/2019 10:39 AM

## 2019-11-04 ENCOUNTER — Ambulatory Visit: Payer: Medicaid Other | Admitting: Family Medicine

## 2019-11-10 ENCOUNTER — Other Ambulatory Visit: Payer: Self-pay | Admitting: Women's Health

## 2019-11-15 ENCOUNTER — Other Ambulatory Visit: Payer: Medicaid Other | Admitting: Women's Health

## 2019-11-25 ENCOUNTER — Other Ambulatory Visit: Payer: Self-pay | Admitting: Women's Health

## 2019-11-27 ENCOUNTER — Ambulatory Visit: Payer: Medicaid Other | Admitting: Family Medicine

## 2019-11-27 ENCOUNTER — Other Ambulatory Visit: Payer: Self-pay

## 2019-11-27 ENCOUNTER — Encounter: Payer: Self-pay | Admitting: Family Medicine

## 2019-11-27 VITALS — BP 113/75 | HR 81 | Ht 62.0 in | Wt 141.0 lb

## 2019-11-27 DIAGNOSIS — E039 Hypothyroidism, unspecified: Secondary | ICD-10-CM

## 2019-11-27 DIAGNOSIS — M62838 Other muscle spasm: Secondary | ICD-10-CM

## 2019-11-27 MED ORDER — SANTYL 250 UNIT/GM EX OINT
1.0000 | TOPICAL_OINTMENT | Freq: Every day | CUTANEOUS | 0 refills | Status: DC
Start: 2019-11-27 — End: 2020-01-29

## 2019-11-27 NOTE — Progress Notes (Signed)
BP 113/75   Pulse 81   Ht 5\' 2"  (1.575 m)   Wt 141 lb (64 kg)   BMI 25.79 kg/m    Subjective:   Patient ID: , female    DOB: 1990/06/14, 29 y.o.   MRN: [id number]  HPI: Catherine Haney is a 29 y.o. female presenting on 11/27/2019 for Medical Management of Chronic Issues   HPI Hypothyroidism recheck Patient is coming in for thyroid recheck today as well. They deny any issues with hair changes or heat or cold problems or diarrhea or constipation. They deny any chest pain or palpitations. They are currently on levothyroxine 50 micrograms.  Her symptoms have improved because she has less fatigue has more energy but she still having hair loss and weight gain.  Patient complains of a small burn of her right wrist from cooking, she has used Santyl before would like a refill for that.  Patient continues to have neck pain and strain, she has had this before and has done physical therapy and that really helped her she just gets tight through both of her shoulders and up through her neck and she would like to go back to physical therapy.  Relevant past medical, surgical, family and social history reviewed and updated as indicated. Interim medical history since our last visit reviewed. Allergies and medications reviewed and updated.  Review of Systems  Constitutional: Positive for unexpected weight change. Negative for chills, fatigue and fever.  Eyes: Negative for visual disturbance.  Respiratory: Negative for chest tightness and shortness of breath.   Cardiovascular: Negative for chest pain and leg swelling.  Genitourinary: Negative for difficulty urinating and dysuria.  Musculoskeletal: Negative for back pain and gait problem.  Skin: Negative for rash.  Neurological: Negative for light-headedness and headaches.  Psychiatric/Behavioral: Negative for agitation and behavioral problems.  All other systems reviewed and are negative.   Per HPI unless specifically indicated  above   Allergies as of 11/27/2019   No Known Allergies     Medication List       Accurate as of November 27, 2019  3:01 PM. If you have any questions, ask your nurse or doctor.        STOP taking these medications   megestrol 40 MG tablet Commonly known as: MEGACE Stopped by: November 29, 2019, MD   prenatal vitamin w/FE, FA 27-1 MG Tabs tablet Stopped by: [name], MD   Vitamin D (Ergocalciferol) 1.25 MG (50000 UNIT) Caps capsule Commonly known as: DRISDOL Stopped by: [name] Catherine Boback, MD     TAKE these medications   levonorgestrel 20 MCG/24HR IUD Commonly known as: MIRENA 1 each by Intrauterine route once.   levothyroxine 50 MCG tablet Commonly known as: SYNTHROID Take 1 tablet (50 mcg total) by mouth daily before breakfast.   omeprazole 20 MG capsule Commonly known as: PRILOSEC Take 20 mg by mouth daily.   Santyl ointment Generic drug: collagenase Apply 1 application topically daily. Started by: Elige Radon Ron Beske, MD        Objective:   BP 113/75   Pulse 81   Ht 5\' 2"  (1.575 m)   Wt 141 lb (64 kg)   BMI 25.79 kg/m   Wt Readings from Last 3 Encounters:  11/27/19 141 lb (64 kg)  10/18/19 139 lb (63 kg)  10/10/19 138 lb (62.6 kg)    Physical Exam Vitals and nursing note reviewed.  Constitutional:      General: She is not in acute distress.  Appearance: She is well-developed. She is not diaphoretic.  Eyes:     Conjunctiva/sclera: Conjunctivae normal.  Cardiovascular:     Rate and Rhythm: Normal rate and regular rhythm.     Heart sounds: Normal heart sounds. No murmur heard.   Pulmonary:     Effort: Pulmonary effort is normal. No respiratory distress.     Breath sounds: Normal breath sounds. No wheezing.  Musculoskeletal:        General: No tenderness. Normal range of motion.  Skin:    General: Skin is warm and dry.     Findings: Lesion (Small scabbed over burn on right posterior lateral wrist, healing) present. No rash.   Neurological:     Mental Status: She is alert and oriented to person, place, and time.     Coordination: Coordination normal.  Psychiatric:        Behavior: Behavior normal.       Assessment & Plan:   Problem List Items Addressed This Visit      Endocrine   Hypothyroidism (acquired)   Relevant Orders   Thyroid Panel With TSH    Other Visit Diagnoses    Muscle spasms of neck    -  Primary   Relevant Orders   Ambulatory referral to Physical Therapy      Will refer to physical therapy for muscle spasms of neck again, she seems to do well with that.  Will recheck thyroid and adjust if necessary Follow up plan: Return in about 2 months (around 01/28/2020), or if symptoms worsen or fail to improve, for Recheck of thyroid.  Counseling provided for all of the vaccine components Orders Placed This Encounter  Procedures  . Thyroid Panel With TSH  . Ambulatory referral to Physical Therapy    Arville Care, MD [facility] 11/27/2019, 3:01 PM

## 2019-11-28 LAB — THYROID PANEL WITH TSH
Free Thyroxine Index: 1.7 (ref 1.2–4.9)
T3 Uptake Ratio: 24 % (ref 24–39)
T4, Total: 7 ug/dL (ref 4.5–12.0)
TSH: 3.31 u[IU]/mL (ref 0.450–4.500)

## 2019-12-09 ENCOUNTER — Ambulatory Visit: Payer: Medicaid Other | Admitting: Physical Therapy

## 2019-12-10 ENCOUNTER — Other Ambulatory Visit: Payer: Medicaid Other | Admitting: Women's Health

## 2019-12-10 DIAGNOSIS — R05 Cough: Secondary | ICD-10-CM | POA: Diagnosis not present

## 2019-12-10 DIAGNOSIS — R509 Fever, unspecified: Secondary | ICD-10-CM | POA: Diagnosis not present

## 2019-12-10 DIAGNOSIS — E039 Hypothyroidism, unspecified: Secondary | ICD-10-CM | POA: Diagnosis not present

## 2019-12-10 DIAGNOSIS — J0100 Acute maxillary sinusitis, unspecified: Secondary | ICD-10-CM | POA: Diagnosis not present

## 2019-12-10 DIAGNOSIS — J029 Acute pharyngitis, unspecified: Secondary | ICD-10-CM | POA: Diagnosis not present

## 2019-12-10 DIAGNOSIS — Z20822 Contact with and (suspected) exposure to covid-19: Secondary | ICD-10-CM | POA: Diagnosis not present

## 2019-12-10 DIAGNOSIS — Z6825 Body mass index (BMI) 25.0-25.9, adult: Secondary | ICD-10-CM | POA: Diagnosis not present

## 2019-12-11 LAB — TSH: TSH: 5.11 mIU/L — ABNORMAL HIGH

## 2019-12-11 LAB — T4, FREE: Free T4: 1 ng/dL (ref 0.8–1.8)

## 2019-12-11 LAB — THYROID PEROXIDASE ANTIBODY: Thyroperoxidase Ab SerPl-aCnc: 866 IU/mL — ABNORMAL HIGH (ref <9)

## 2019-12-11 LAB — THYROGLOBULIN ANTIBODY: Thyroglobulin Ab: 9 IU/mL — ABNORMAL HIGH (ref <=1)

## 2019-12-12 ENCOUNTER — Encounter: Payer: Self-pay | Admitting: "Endocrinology

## 2019-12-12 ENCOUNTER — Ambulatory Visit (INDEPENDENT_AMBULATORY_CARE_PROVIDER_SITE_OTHER): Payer: Medicaid Other | Admitting: "Endocrinology

## 2019-12-12 ENCOUNTER — Other Ambulatory Visit: Payer: Self-pay

## 2019-12-12 VITALS — BP 100/68 | HR 88 | Ht 62.0 in | Wt 139.2 lb

## 2019-12-12 DIAGNOSIS — E063 Autoimmune thyroiditis: Secondary | ICD-10-CM | POA: Diagnosis not present

## 2019-12-12 DIAGNOSIS — E038 Other specified hypothyroidism: Secondary | ICD-10-CM

## 2019-12-12 NOTE — Progress Notes (Signed)
12/12/2019, 1:36 PM  Endocrinology follow-up note   Catherine Haney is a 29 y.o.-year-old female patient being seen in follow-up after she was seen consultation for hypothyroidism referred by Dettinger, Fransisca Kaufmann, MD.   Past Medical History:  Diagnosis Date  . Anemia affecting third pregnancy 10/18/2018   HGB 11.7 --> 9.5 ->9.3 ->Fe++-> 10.1  . GERD (gastroesophageal reflux disease)   . PROM (premature rupture of membranes) 01/13/2019  . Supervision of normal pregnancy 07/10/2018     FAMILY TREE  LAB RESULTS Language Arabic Pap 10/19 per pt Catherine Haney PCP Initiated care at 13wk GC/CT Initial: -/-           36wks: Dating by LMP c/w 10wk U/S   Support person Catherine Haney Genetics NT/IT:declined FHL:[id number]      MaterniT21:   [place]/HgbE neg Flu vaccine Declined 2/18 CF declined TDaP vaccine declined SMA  Rhogam n/a     Blood Type O/Positive/-- (02/18 1044) Anatomy US Normal female Antibody N    Past Surgical History:  Procedure Laterality Date  . DILATION AND CURETTAGE OF UTERUS N/A 08/25/2017   Procedure: SUCTION DILATATION AND CURETTAGE;  Surgeon: Catherine Buff, MD;  Location: [facility];  Service: Gynecology;  Laterality: N/A;    Social History   Socioeconomic History  . Marital status: Married    Spouse name: Catherine Haney  . Number of children: 3  . Years of education: Not on file  . Highest education level: Not on file  Occupational History  . Not on file  Tobacco Use  . Smoking status: Never Smoker  . Smokeless tobacco: Never Used  Vaping Use  . Vaping Use: Never used  Substance and Sexual Activity  . Alcohol use: Never    Alcohol/week: 0.0 standard drinks  . Drug use: Never  . Sexual activity: Not Currently    Birth control/protection: I.U.D.  Other Topics Concern  . Not on file  Social History Narrative  . Not on file   Social Determinants of Health   Financial Resource Strain:    . Difficulty of Paying Living Expenses:   Food Insecurity:   . Worried About Charity fundraiser in the Last Year:   . Arboriculturist in the Last Year:   Transportation Needs:   . Film/video editor (Medical):   Marland Kitchen Lack of Transportation (Non-Medical):   Physical Activity:   . Days of Exercise per Week:   . Minutes of Exercise per Session:   Stress:   . Feeling of Stress :   Social Connections:   . Frequency of Communication with Friends and Family:   . Frequency of Social Gatherings with Friends and Family:   . Attends Religious Services:   . Active Member of Clubs or Organizations:   . Attends Archivist Meetings:   Marland Kitchen Marital Status:     Family  History  Problem Relation Age of Onset  . Hypertension Mother   . Diabetes Mother   . Diabetes Father   . Hypertension Paternal Grandfather     Outpatient Encounter Medications as of 12/12/2019  Medication Sig  . collagenase (SANTYL) ointment Apply 1 application topically daily.  Marland Kitchen levonorgestrel (MIRENA) 20 MCG/24HR IUD 1 each by Intrauterine route once.  Marland Kitchen levothyroxine (SYNTHROID) 50 MCG tablet Take 1 tablet (50 mcg total) by mouth daily before breakfast.  . omeprazole (PRILOSEC) 20 MG capsule Take 20 mg by mouth daily.   No facility-administered encounter medications on file as of 12/12/2019.    ALLERGIES: No Known Allergies VACCINATION STATUS: Immunization History  Administered Date(s) Administered  . Hepatitis B 07/25/2007, 04/03/2013  . IPV 07/25/2007  . Influenza-Unspecified 04/03/2013, 02/26/2014  . MMR 07/25/2007  . Td 07/25/2007, 04/03/2013  . Tdap 07/25/2007, 04/03/2013, 10/11/2017  . Varicella 07/25/2007     HPI   She is being seen with  Arabic language interpreter  Catherine Haney. Catherine Haney  is a patient with the above medical history. she was diagnosed  with postpartum thyroiditis with significantly elevated TSH suggestive of hypothyroidism in March 2021. She was subsequently diagnosed  with primary hypothyroidism which required initiation of thyroid hormone supplement. She took levothyroxine 50 mcg with correction of her thyroid function test towards target continue she decided to withdraw levothyroxine at the beginning of July after her labs showed target TSH in total T4. -Her most recent thyroid function tests shows no higher TSH at 5.1 and low normal free T4 at 1.0.   -Her labs also confirm Hashimoto's thyroiditis as a background. -She has no family history of thyroid dysfunction. She reports fatigue, weight gain.   She denies dysphagia, shortness of breath, nor voice change.  She denies any prior history of thyroid dysfunction.  No family history of thyroid cancer.  No history of  radiation therapy to head or neck. No recent use of iodine supplements.    ROS:  Constitutional: + Steady weight,   +fatigue, + subjective hypothermia Eyes: no blurry vision, no xerophthalmia ENT: no sore throat, no nodules palpated in throat, no dysphagia/odynophagia, no hoarseness Cardiovascular: no Chest Pain, no Shortness of Breath, no palpitations, no leg swelling Respiratory: no cough, no SOB Gastrointestinal: no Nausea/Vomiting/Diarhhea Musculoskeletal: no muscle/joint aches Skin: no rashes Neurological: no tremors, no numbness, no tingling, no dizziness Psychiatric: no depression, no anxiety   Physical Exam: BP 100/68   Pulse 88   Ht _0  (1.575 m)   Wt 139 lb 3.2 oz (63.1 kg)   BMI 25.46 kg/m  Wt Readings from Last 3 Encounters:  12/12/19 139 lb 3.2 oz (63.1 kg)  11/27/19 141 lb (64 kg)  10/18/19 139 lb (63 kg)    Constitutional:  Body mass index is 25.46 kg/m., not in acute distress, normal state of mind Eyes: PERRLA, EOMI, no exophthalmos ENT: moist mucous membranes, no thyromegaly, no cervical lymphadenopathy Cardiovascular: normal precordial activity, Regular Rate and Rhythm, no Murmur/Rubs/Gallops Respiratory:  adequate breathing efforts, no gross chest  deformity, Clear to auscultation bilaterally Gastrointestinal: abdomen soft, Non -tender, No distension, Bowel Sounds present Musculoskeletal: no gross deformities, strength intact in all four extremities Skin: moist, warm, no rashes Neurological: no tremor with outstretched hands, Deep tendon reflexes normal in all four extremities.   CMP ( most recent) CMP     Component Value Date/Time   NA 136 08/15/2019 0807   K 4.5 08/15/2019 0807   CL 102 08/15/2019 0807  CO2 21 08/15/2019 0807   GLUCOSE 90 08/15/2019 0807   GLUCOSE 94 10/30/2018 0555   BUN 16 08/15/2019 0807   CREATININE 0.61 08/15/2019 0807   CALCIUM 8.8 08/15/2019 0807   PROT 7.9 08/15/2019 0807   ALBUMIN 4.5 08/15/2019 0807   AST 25 08/15/2019 0807   ALT 20 08/15/2019 0807   ALKPHOS 93 08/15/2019 0807   BILITOT <0.2 08/15/2019 0807   GFRNONAA 123 08/15/2019 0807   GFRAA 142 08/15/2019 0807     Diabetic Labs (most recent): No results found for: HGBA1C   Lipid Panel ( most recent) Lipid Panel     Component Value Date/Time   CHOL 199 08/20/2015 1116   TRIG 56 08/20/2015 1116   HDL 47 08/20/2015 1116   CHOLHDL 4.2 08/20/2015 1116   LDLCALC 141 (H) 08/20/2015 1116   LABVLDL 11 08/20/2015 1116   Recent Results (from the past 2160 hour(s))  Thyroid Panel With TSH     Status: None   Collection Time: 11/27/19  3:04 PM  Result Value Ref Range   TSH 3.310 0.450 - 4.500 uIU/mL   T4, Total 7.0 4.5 - 12.0 ug/dL   T3 Uptake Ratio 24 24 - 39 %   Free Thyroxine Index 1.7 1.2 - 4.9  TSH     Status: Abnormal   Collection Time: 12/10/19  4:34 PM  Result Value Ref Range   TSH 5.11 (H) mIU/L    Comment:           Reference Range .           > or = 20 Years  0.40-4.50 .                Pregnancy Ranges           First trimester    0.26-2.66           Second trimester   0.55-2.73           Third trimester    0.43-2.91   T4, free     Status: None   Collection Time: 12/10/19  4:34 PM  Result Value Ref Range   Free  T4 1.0 0.8 - 1.8 ng/dL  Thyroid peroxidase antibody     Status: Abnormal   Collection Time: 12/10/19  4:34 PM  Result Value Ref Range   Thyroperoxidase Ab SerPl-aCnc 866 (H) <9 IU/mL  Thyroglobulin antibody     Status: Abnormal   Collection Time: 12/10/19  4:34 PM  Result Value Ref Range   Thyroglobulin Ab 9 (H) < or = 1 IU/mL       ASSESSMENT: 1. Hypothyroidism 2. Hashimoto's thyroiditis  PLAN:    -Her previsit work-up indicates primary hypothyroidism due to Hashimoto's thyroiditis. It was likely triggered by her recent postpartum thyroiditis.   -I had a long discussion with her through her interpreter that she will need thyroid hormone supplement for the long term. -She agrees to reinitiate levothyroxine 50 mcg p.o. daily before breakfast.    - We discussed about the correct intake of her thyroid hormone, on empty stomach at fasting, with water, separated by at least 30 minutes from breakfast and other medications,  and separated by more than 4 hours from calcium, iron, multivitamins, acid reflux medications (PPIs). -Patient is made aware of the fact that thyroid hormone replacement is needed for life, dose to be adjusted by periodic monitoring of thyroid function tests.  -Due to absence of clinical goiter, no need for thyroid ultrasound.  I used Arabic language interpreter  Catherine Haney  to convey my decisions to her.     - Time spent on this patient care encounter:  20 minutes of which 50% was spent in  counseling and the rest reviewing  her current and  previous labs / studies and medications  doses and developing a plan for long term care. [name]  participated in the discussions, expressed understanding, and voiced agreement with the above plans.  All questions were answered to her satisfaction. she is encouraged to contact clinic should she have any questions or concerns prior to her return visit.   Return in about 3 months (around 03/13/2020) for F/U with  Pre-visit Labs.  Glade Lloyd, MD [facility] [address] [place], [place] [address] Phone: [phone]  Fax: [phone]   12/12/2019, 1:36 PM  This note was partially dictated with voice recognition software. Similar sounding words can be transcribed inadequately or may not  be corrected upon review.

## 2019-12-16 ENCOUNTER — Ambulatory Visit: Payer: Medicaid Other | Admitting: Physical Therapy

## 2019-12-18 ENCOUNTER — Ambulatory Visit: Payer: Medicaid Other | Admitting: Physical Therapy

## 2019-12-24 ENCOUNTER — Encounter: Payer: Self-pay | Admitting: Physical Therapy

## 2019-12-24 ENCOUNTER — Ambulatory Visit: Payer: Medicaid Other | Attending: Family Medicine | Admitting: Physical Therapy

## 2019-12-24 ENCOUNTER — Other Ambulatory Visit: Payer: Self-pay

## 2019-12-24 ENCOUNTER — Encounter: Payer: Self-pay | Admitting: Family Medicine

## 2019-12-24 ENCOUNTER — Telehealth (INDEPENDENT_AMBULATORY_CARE_PROVIDER_SITE_OTHER): Payer: Medicaid Other | Admitting: Family Medicine

## 2019-12-24 DIAGNOSIS — M546 Pain in thoracic spine: Secondary | ICD-10-CM

## 2019-12-24 DIAGNOSIS — R293 Abnormal posture: Secondary | ICD-10-CM | POA: Diagnosis not present

## 2019-12-24 DIAGNOSIS — L659 Nonscarring hair loss, unspecified: Secondary | ICD-10-CM

## 2019-12-24 DIAGNOSIS — M542 Cervicalgia: Secondary | ICD-10-CM | POA: Diagnosis not present

## 2019-12-24 DIAGNOSIS — M6281 Muscle weakness (generalized): Secondary | ICD-10-CM | POA: Insufficient documentation

## 2019-12-24 NOTE — Progress Notes (Signed)
Translator used for duration of visit [id number].  Virtual Visit via Telephone Note  I connected with Catherine Haney on 12/24/19 at 5:04 PM by telephone and verified that I am speaking with the correct person using two identifiers. Catherine Haney is currently located at home and nobody is currently with her during this visit. The provider, Gwenlyn Fudge, FNP is located in their office at time of visit.  I discussed the limitations, risks, security and privacy concerns of performing an evaluation and management service by telephone and the availability of in person appointments. I also discussed with the patient that there may be a patient responsible charge related to this service. The patient expressed understanding and agreed to proceed.  Subjective: PCP: Dettinger, Elige Radon, MD  Chief Complaint  Patient presents with  . Alopecia   Patient reports her hair is falling out.  She has a history of hypothyroidism and stopped her medication after her last labs resulted with her PCP and they were normal.  She has since seen endocrinologist, Dr. Fransico Him who discussed with her that she needs to resume the levothyroxine as this is lifelong.  She has started the medication back and is adhering to it.  She would like to know what type of vitamins she can take for her hair.   ROS: Per HPI  Current Outpatient Medications:  .  collagenase (SANTYL) ointment, Apply 1 application topically daily., Disp: 30 g, Rfl: 0 .  levonorgestrel (MIRENA) 20 MCG/24HR IUD, 1 each by Intrauterine route once., Disp: , Rfl:  .  levothyroxine (SYNTHROID) 50 MCG tablet, Take 1 tablet (50 mcg total) by mouth daily before breakfast., Disp: 90 tablet, Rfl: 1 .  omeprazole (PRILOSEC) 20 MG capsule, Take 20 mg by mouth daily., Disp: , Rfl:   No Known Allergies Past Medical History:  Diagnosis Date  . Anemia affecting third pregnancy 10/18/2018   HGB 11.7 --> 9.5 ->9.3 ->Fe++-> 10.1  . GERD (gastroesophageal reflux disease)   .  PROM (premature rupture of membranes) 01/13/2019  . Supervision of normal pregnancy 07/10/2018     FAMILY TREE  LAB RESULTS Language Arabic Pap 10/19 per pt [place] PCP Initiated care at 13wk GC/CT Initial: -/-           36wks: Dating by LMP c/w 10wk U/S   Support person [place] Genetics NT/IT:declined OFB:[id number]      MaterniT21:   [place]/HgbE neg Flu vaccine Declined 2/18 CF declined TDaP vaccine declined SMA  Rhogam n/a     Blood Type O/Positive/-- (02/18 1044) Anatomy US Normal female Antibody N    Observations/Objective: A&O  No respiratory distress or wheezing audible over the phone Mood, judgement, and thought processes all WNL   Assessment and Plan: 1. Hair loss - Continue levothyroxine. May start Biotin supplement.    Follow Up Instructions:  I discussed the assessment and treatment plan with the patient. The patient was provided an opportunity to ask questions and all were answered. The patient agreed with the plan and demonstrated an understanding of the instructions.   The patient was advised to call back or seek an in-person evaluation if the symptoms worsen or if the condition fails to improve as anticipated.  The above assessment and management plan was discussed with the patient. The patient verbalized understanding of and has agreed to the management plan. Patient is aware to call the clinic if symptoms persist or worsen. Patient is aware when to return to the clinic for a follow-up visit. Patient educated on  when it is appropriate to go to the emergency department.   Time call ended: 5:14 PM  I provided 12 minutes of non-face-to-face time during this encounter.  Catherine Boston, MSN, APRN, FNP-C [facility] 12/24/19

## 2019-12-24 NOTE — Therapy (Signed)
[facility] [address] [place], [place], [address] Phone: [phone]   Fax:  [phone]  Physical Therapy Evaluation  Patient Details  Name: Catherine Haney MRN: [id number] Date of Birth: [birth year] Referring Provider (PT): Arville CareJoshua Dettinger, MD   Encounter Date: 12/24/2019   PT End of Session - 12/24/19 1642    Visit Number 1    Number of Visits 8    Date for PT Re-Evaluation 02/25/20    Authorization Type Medicaid    PT Start Time 1300    PT Stop Time 1339    PT Time Calculation (min) 39 min    Activity Tolerance Patient limited by pain    Behavior During Therapy [facility] for tasks assessed/performed           Past Medical History:  Diagnosis Date  . Anemia affecting third pregnancy 10/18/2018   HGB 11.7 --> 9.5 ->9.3 ->Fe++-> 10.1  . GERD (gastroesophageal reflux disease)   . PROM (premature rupture of membranes) 01/13/2019  . Supervision of normal pregnancy 07/10/2018     FAMILY TREE  LAB RESULTS Language Arabic Pap 10/19 per pt [place] PCP Initiated care at 13wk GC/CT Initial: -/-           36wks: Dating by LMP c/w 10wk U/S   Support person [employer] NT/IT:declined BMW:UXLKGMWNAFP:declined      MaterniT21:   [place]/HgbE neg Flu vaccine Declined 2/18 CF declined TDaP vaccine declined SMA  Rhogam n/a     Blood Type O/Positive/-- (02/18 1044) Anatomy US Normal female Antibody N    Past Surgical History:  Procedure Laterality Date  . DILATION AND CURETTAGE OF UTERUS N/A 08/25/2017   Procedure: SUCTION DILATATION AND CURETTAGE;  Surgeon: Lazaro ArmsEure, Luther H, MD;  Location: [facility];  Service: Gynecology;  Laterality: N/A;    There were no vitals filed for this visit.    Subjective Assessment - 12/24/19 1346    Subjective COVID-19 screening performed upon arrival.Patient arrives to physical therapy with reports of pain in cervical spine and upper thoracic spine that has been ongoing for over two years. Patient reports pain with child care activities  especially with bathing her child. Patient has more pain on left side than right side and has difficulties with overhead reaching. Patient reports being consistent with HEP provided from last episode of care daily with minimal relief. Patient reports pain at worst as 10/10 and pain at best as 7/10. Patient's goals are to decrease pain, improve movement, improve strength, and have less difficulties with HEP and caregiving tasks.    Pertinent History hypothyroidism, GERD    Limitations Lifting;House hold activities;Other (comment)   childcare activiites   Patient Stated Goals decrease pain    Currently in Pain? Yes    Pain Score 7     Pain Location Neck    Pain Orientation Right;Left;Lower    Pain Descriptors / Indicators Sharp    Pain Type Chronic pain    Pain Onset More than a month ago    Pain Frequency Constant    Aggravating Factors  giving child a bath, overhead activities, lifting    Pain Relieving Factors resting    Effect of Pain on Daily Activities difficulties performing childcare activities and home chores.              [facility] Assessment - 12/24/19 0001      Assessment   Medical Diagnosis Spasms of the neck    Referring Provider (PT) Arville CareJoshua Dettinger, MD  Onset Date/Surgical Date --   ongoing for 2 years   Hand Dominance Right    Next MD Visit 01/29/2020    Prior Therapy yes      Precautions   Precautions None      Restrictions   Weight Bearing Restrictions No      Balance Screen   Has the patient fallen in the past 6 months No    Has the patient had a decrease in activity level because of a fear of falling?  No    Is the patient reluctant to leave their home because of a fear of falling?  No      Home Environment   Living Environment Private residence    Living Arrangements Spouse/significant other;Children      Prior Function   Level of Independence Independent with basic ADLs      Posture/Postural Control   Posture/Postural Control Postural limitations     Postural Limitations Forward head;Rounded Shoulders;Increased thoracic kyphosis      ROM / Strength   AROM / PROM / Strength Strength;AROM      AROM   Overall AROM  Due to pain    AROM Assessment Site Cervical    Cervical Flexion 58    Cervical Extension 38    Cervical - Right Side Bend 30    Cervical - Left Side Bend 30    Cervical - Right Rotation 80    Cervical - Left Rotation 57      Strength   Overall Strength Comments (+) pain    Strength Assessment Site Shoulder    Right/Left Shoulder Right;Left    Right Shoulder Flexion 3+/5    Right Shoulder ABduction 3+/5    Right Shoulder Internal Rotation 4-/5    Right Shoulder External Rotation 4-/5    Left Shoulder Flexion 3+/5    Left Shoulder ABduction 3+/5    Left Shoulder Internal Rotation 4-/5    Left Shoulder External Rotation 4-/5      Palpation   Spinal mobility limited PA thoracic joint mobs    Palpation comment significant UT tone bilaterally       Ambulation/Gait   Gait Pattern Within Functional Limits                      Objective measurements completed on examination: See above findings.               PT Education - 12/24/19 1640    Education Details thoracic extension mobilization, sidelying open book stretching, cat camel    Person(s) Educated Patient    Methods Explanation;Demonstration;Handout    Comprehension Verbalized understanding;Returned demonstration            PT Short Term Goals - 12/24/19 1753      PT SHORT TERM GOAL #1   Title Patient will be independent with HEP    Baseline no knowledge of exercises    Time 3    Period Weeks    Status New             PT Long Term Goals - 12/24/19 1753      PT LONG TERM GOAL #1   Title Patient will be independent with advanced HEP.    Baseline no knowledge of HEP    Time 8    Period Weeks    Status New      PT LONG TERM GOAL #2   Title Patient will report ability to peform ADLs and caregiving tasks  with neck  pain less than or equal to 5/10.    Baseline 9/10 pain with activities    Time 8    Period Weeks    Status New      PT LONG TERM GOAL #3   Title Patient will demonstrate 70 degrees of left cervical rotation AROM to improve ability to perform home tasks and driving.    Baseline 57 degrees left, 80 degrees right    Time 8    Period Weeks    Status New      PT LONG TERM GOAL #4   Title -----                  Plan - 12/24/19 1643    Clinical Impression Statement Patient is a 29 year old female who presents to physical therapy with chronic neck and upper thoracic pain, decreased L cervical rotation, decreased shoulder MMT with associated pain, and decreased thoracic mobilization that has been ongoing for two years. Patient significant bilateral UT, levator scapulae and cervical paraspinal tone upon palpation. Patient and PT discussed plan of care and HEP to which she reported understanding. Patient would benefit from skilled physical therapy to address deficits and patient's goals.    Personal Factors and Comorbidities Age;Comorbidity 2    Examination-Activity Limitations Bend;Carry;Caring for Others;Lift    Examination-Participation Restrictions Cleaning;Meal Prep;Laundry    Stability/Clinical Decision Making Stable/Uncomplicated    Clinical Decision Making Low    Rehab Potential Fair    PT Frequency 1x / week    PT Duration 8 weeks    PT Treatment/Interventions ADLs/Self Care Home Management;Cryotherapy;Electrical Stimulation;Moist Heat;Traction;Ultrasound;Neuromuscular re-education;Manual techniques;Spinal Manipulations;Therapeutic exercise;Patient/family education    PT Next Visit Plan UBE, thoracic and cervical mobilization, traction to cervical spine as tolerated. Modalities PRN for pain relief.    PT Home Exercise Plan see patient education section    Consulted and Agree with Plan of Care Patient          Check all possible CPT codes:      [x]  97110 (Therapeutic  Exercise)  []  92507 (SLP Treatment)  [x]  97112 (Neuro Re-ed)   []  92526 (Swallowing Treatment)   []  97116 (Gait Training)   []  [phone] (Cognitive Training, 1st 15 minutes) [x]  97140 (Manual Therapy)   []  (Cognitive Training, each add'l 15 minutes)  [x]  97530 (Therapeutic Activities)  []  Other, List CPT Code ____________    [x]  97535 (Self Care)       []  All codes above (97110 - 97535)  [x]  97012 (Mechanical Traction)  [x]  97014 (E-stim Unattended)  [x]  97032 (E-stim manual)  [x]  97033 (Ionto)  [x]  97035 (Ultrasound)  []  97016 (Vaso)  []  97760 (Orthotic Fit) []  (Prosthetic Training) []  (Physical Performance Training) []  (Aquatic Therapy) []  (Canalith Repositioning) []  84132 (Contrast Bath) []  (Paraffin) []  97597 (Wound Care 1st 20 sq cm) []  97598 (Wound Care each add'l 20 sq cm)    Patient will benefit from skilled therapeutic intervention in order to improve the following deficits and impairments:  Decreased activity tolerance, Decreased strength, Decreased range of motion, Pain, Impaired UE functional use, Increased muscle spasms, Postural dysfunction  Visit Diagnosis: Cervicalgia  Abnormal posture  Pain in thoracic spine  Muscle weakness (generalized)     Problem List Patient Active Problem List   Diagnosis Date Noted  . Hypothyroidism due to Hashimoto's thyroiditis 12/12/2019  . Hypothyroidism (acquired) 11/27/2019  . IUD (intrauterine device) in place 10/10/2019  . Dyspareunia, female 10/10/2019  .  Prolonged periods 10/10/2019  . Pelvic pain 10/10/2019  . Chronic fatigue and malaise 08/14/2019  . Encounter for IUD insertion 02/28/2019  . Atypical chest pain 10/17/2017  . Hyperlipidemia 08/27/2015  . GERD (gastroesophageal reflux disease) 08/20/2015  . Generalized anxiety disorder 07/24/2015  . Other seasonal allergic rhinitis 03/18/2015  . Chronic pain of multiple joints 03/18/2015    Guss Bunde, PT, DPT 12/24/2019,  6:09 PM  [facility] [address] [place], [place], [address] Phone: [phone]   Fax:  [phone]  Name: Catherine Haney MRN: [id number] Date of Birth: [birth year]

## 2020-01-06 ENCOUNTER — Other Ambulatory Visit: Payer: Medicaid Other | Admitting: Women's Health

## 2020-01-08 ENCOUNTER — Other Ambulatory Visit: Payer: Self-pay | Admitting: Family Medicine

## 2020-01-08 DIAGNOSIS — K219 Gastro-esophageal reflux disease without esophagitis: Secondary | ICD-10-CM

## 2020-01-28 ENCOUNTER — Encounter: Payer: Self-pay | Admitting: Physical Therapy

## 2020-01-28 ENCOUNTER — Ambulatory Visit: Payer: Medicaid Other | Attending: Family Medicine | Admitting: Physical Therapy

## 2020-01-28 ENCOUNTER — Other Ambulatory Visit: Payer: Self-pay

## 2020-01-28 DIAGNOSIS — M546 Pain in thoracic spine: Secondary | ICD-10-CM

## 2020-01-28 DIAGNOSIS — M542 Cervicalgia: Secondary | ICD-10-CM

## 2020-01-28 DIAGNOSIS — M6281 Muscle weakness (generalized): Secondary | ICD-10-CM | POA: Insufficient documentation

## 2020-01-28 DIAGNOSIS — R293 Abnormal posture: Secondary | ICD-10-CM

## 2020-01-28 NOTE — Therapy (Signed)
[facility] [address] [place], [place], [address] Phone: [phone]   Fax:  [phone]  Physical Therapy Treatment  Patient Details  Name: Catherine Haney MRN: [id number] Date of Birth: 03/18/1991 Referring Provider (PT): Arville Care, MD   Encounter Date: 01/28/2020   PT End of Session - 01/28/20 0743    Visit Number 2    Number of Visits 8    Date for PT Re-Evaluation 02/25/20    Authorization Type Medicaid    PT Start Time 0741   late arrival   PT Stop Time 0816    PT Time Calculation (min) 35 min    Activity Tolerance Patient tolerated treatment well    Behavior During Therapy [facility] for tasks assessed/performed           Past Medical History:  Diagnosis Date  . Anemia affecting third pregnancy 10/18/2018   HGB 11.7 --> 9.5 ->9.3 ->Fe++-> 10.1  . GERD (gastroesophageal reflux disease)   . PROM (premature rupture of membranes) 01/13/2019  . Supervision of normal pregnancy 07/10/2018     FAMILY TREE  LAB RESULTS Language Arabic Pap 10/19 per pt [place] PCP Initiated care at 13wk GC/CT Initial: -/-           36wks: Dating by LMP c/w 10wk U/S   Support person [employer] NT/IT:declined HTD:[id number]      MaterniT21:   [place]/HgbE neg Flu vaccine Declined 2/18 CF declined TDaP vaccine declined SMA  Rhogam n/a     Blood Type O/Positive/-- (02/18 1044) Anatomy US Normal female Antibody N    Past Surgical History:  Procedure Laterality Date  . DILATION AND CURETTAGE OF UTERUS N/A 08/25/2017   Procedure: SUCTION DILATATION AND CURETTAGE;  Surgeon: Lazaro Arms, MD;  Location: [facility];  Service: Gynecology;  Laterality: N/A;    There were no vitals filed for this visit.   Subjective Assessment - 01/28/20 0741    Subjective COVID-19 screening performed upon arrival. Patinet arrives with 7/10 pain today.    Pertinent History hypothyroidism, GERD    Limitations Lifting;House hold activities;Other (comment)    Patient Stated Goals  decrease pain    Currently in Pain? Yes    Pain Score 7     Pain Location Neck    Pain Orientation Right;Left;Lower    Pain Descriptors / Indicators Sharp    Pain Type Chronic pain              [facility] PT Assessment - 01/28/20 0001      Assessment   Medical Diagnosis Spasms of the neck    Referring Provider (PT) Arville Care, MD    Hand Dominance Right    Next MD Visit 01/29/2020    Prior Therapy yes      Precautions   Precautions None                         [facility] Adult PT Treatment/Exercise - 01/28/20 0001      Exercises   Exercises Neck      Neck Exercises: Machines for Strengthening   UBE (Upper Arm Bike) 120 RPM 8 mins (4 fwd, 4 bwd)      Neck Exercises: Theraband   Other Theraband Exercises lat pull down blue XTS seated 2x10      Neck Exercises: Standing   Lift / Chop 20 reps    Left / Chop Limitations Blue XTS      Neck Exercises: Prone   W  Back 20 reps    Shoulder Extension 20 reps    Rows 20 reps    Other Prone Exercise Prone Y 2x10      Modalities   Modalities Electrical Stimulation;Moist Heat      Moist Heat Therapy   Number Minutes Moist Heat 10 Minutes    Moist Heat Location Cervical      Electrical Stimulation   Electrical Stimulation Location lower cervical and upper thoracic    Electrical Stimulation Action IFC    Electrical Stimulation Parameters 80-150 hz x10 min    Electrical Stimulation Goals Pain                    PT Short Term Goals - 01/28/20 0807      PT SHORT TERM GOAL #1   Title Patient will be independent with HEP    Baseline no knowledge of exercises    Time 3    Period Weeks    Status On-going             PT Long Term Goals - 12/24/19 1753      PT LONG TERM GOAL #1   Title Patient will be independent with advanced HEP.    Baseline no knowledge of HEP    Time 8    Period Weeks    Status New      PT LONG TERM GOAL #2   Title Patient will report ability to peform ADLs and  caregiving tasks with neck pain less than or equal to 5/10.    Baseline 9/10 pain with activities    Time 8    Period Weeks    Status New      PT LONG TERM GOAL #3   Title Patient will demonstrate 70 degrees of left cervical rotation AROM to improve ability to perform home tasks and driving.    Baseline 57 degrees left, 80 degrees right    Time 8    Period Weeks    Status New      PT LONG TERM GOAL #4   Title -----                 Plan - 01/28/20 0757    Clinical Impression Statement Patient responded fairly well to therapy session though with intermittent facial grimmacing. Patient denied any significant increase of pain throughout TEs. Verbal cuing provided to maintain elbow extension with prone Y TEs with strong carryover for remaining reps. No adverse effects upon removal of modalities.    Personal Factors and Comorbidities Age;Comorbidity 2    Examination-Activity Limitations Bend;Carry;Caring for Others;Lift    Examination-Participation Restrictions Cleaning;Meal Prep;Laundry    Stability/Clinical Decision Making Stable/Uncomplicated    Clinical Decision Making Low    Rehab Potential Fair    PT Frequency 1x / week    PT Duration 8 weeks    PT Treatment/Interventions ADLs/Self Care Home Management;Cryotherapy;Electrical Stimulation;Moist Heat;Traction;Ultrasound;Neuromuscular re-education;Manual techniques;Spinal Manipulations;Therapeutic exercise;Patient/family education    PT Next Visit Plan UBE, thoracic and cervical mobilization, traction to cervical spine as tolerated. Modalities PRN for pain relief.    PT Home Exercise Plan see patient education section    Consulted and Agree with Plan of Care Patient           Patient will benefit from skilled therapeutic intervention in order to improve the following deficits and impairments:  Decreased activity tolerance, Decreased strength, Decreased range of motion, Pain, Impaired UE functional use, Increased muscle spasms,  Postural dysfunction  Visit Diagnosis: Cervicalgia  Abnormal posture  Pain in thoracic spine  Muscle weakness (generalized)     Problem List Patient Active Problem List   Diagnosis Date Noted  . Hypothyroidism due to Hashimoto's thyroiditis 12/12/2019  . Hypothyroidism (acquired) 11/27/2019  . IUD (intrauterine device) in place 10/10/2019  . Dyspareunia, female 10/10/2019  . Prolonged periods 10/10/2019  . Pelvic pain 10/10/2019  . Chronic fatigue and malaise 08/14/2019  . Encounter for IUD insertion 02/28/2019  . Atypical chest pain 10/17/2017  . Hyperlipidemia 08/27/2015  . GERD (gastroesophageal reflux disease) 08/20/2015  . Generalized anxiety disorder 07/24/2015  . Other seasonal allergic rhinitis 03/18/2015  . Chronic pain of multiple joints 03/18/2015    Guss Bunde, PT, DPT 01/28/2020, 8:23 AM  [facility] [address] [place], [place], [address] Phone: [phone]   Fax:  [phone]  Name: Catherine Haney MRN: [id number] Date of Birth: 10-26-1990

## 2020-01-29 ENCOUNTER — Encounter: Payer: Self-pay | Admitting: Family Medicine

## 2020-01-29 ENCOUNTER — Other Ambulatory Visit: Payer: Self-pay

## 2020-01-29 ENCOUNTER — Ambulatory Visit: Payer: Medicaid Other | Admitting: Family Medicine

## 2020-01-29 VITALS — BP 105/68 | HR 91 | Temp 98.2°F | Ht 62.0 in | Wt 137.0 lb

## 2020-01-29 DIAGNOSIS — E039 Hypothyroidism, unspecified: Secondary | ICD-10-CM

## 2020-01-29 DIAGNOSIS — D649 Anemia, unspecified: Secondary | ICD-10-CM

## 2020-01-29 DIAGNOSIS — E063 Autoimmune thyroiditis: Secondary | ICD-10-CM | POA: Diagnosis not present

## 2020-01-29 DIAGNOSIS — E038 Other specified hypothyroidism: Secondary | ICD-10-CM

## 2020-01-29 DIAGNOSIS — K219 Gastro-esophageal reflux disease without esophagitis: Secondary | ICD-10-CM

## 2020-01-29 MED ORDER — OMEPRAZOLE 20 MG PO CPDR: 20.0000 mg | DELAYED_RELEASE_CAPSULE | Freq: Every day | ORAL | 3 refills | Status: DC

## 2020-01-29 MED ORDER — LEVOTHYROXINE SODIUM 50 MCG PO TABS: 50.0000 ug | ORAL_TABLET | Freq: Every day | ORAL | 1 refills | Status: DC

## 2020-01-29 NOTE — Progress Notes (Signed)
BP 105/68   Pulse 91   Temp 98.2 F (36.8 C)   Ht 5\' 2"  (1.575 m)   Wt 137 lb (62.1 kg)   SpO2 96%   BMI 25.06 kg/m    Subjective:   Patient ID: , female    DOB: 23-Apr-1991, 29 y.o.   MRN: 11/08/1990  HPI: Catherine Haney is a 29 y.o. female presenting on 01/29/2020 for No chief complaint on file.   HPI Hypothyroidism recheck Patient is coming in for thyroid recheck today as well.  Patient does have some hair changes still and is still trying to lose her weight but is improving.. They deny any chest pain or palpitations. They are currently on levothyroxine 50 micrograms   Anemia  Patient has had anemia before but her energy is feeling better but she would like to recheck her levels.  Relevant past medical, surgical, family and social history reviewed and updated as indicated. Interim medical history since our last visit reviewed. Allergies and medications reviewed and updated.  Review of Systems  Constitutional: Negative for chills and fever.  Eyes: Negative for visual disturbance.  Respiratory: Negative for chest tightness and shortness of breath.   Cardiovascular: Negative for chest pain and leg swelling.  Skin: Negative for rash.  Neurological: Negative for light-headedness and headaches.  Psychiatric/Behavioral: Negative for agitation and behavioral problems.  All other systems reviewed and are negative.   Per HPI unless specifically indicated above   Allergies as of 01/29/2020   No Known Allergies     Medication List       Accurate as of January 29, 2020  1:07 PM. If you have any questions, ask your nurse or doctor.        STOP taking these medications   Santyl ointment Generic drug: collagenase Stopped by: January 31, 2020 Marney Treloar, MD     TAKE these medications   levonorgestrel 20 MCG/24HR IUD Commonly known as: MIRENA 1 each by Intrauterine route once.   levothyroxine 50 MCG tablet Commonly known as: SYNTHROID Take 1 tablet (50 mcg total)  by mouth daily before breakfast.   omeprazole 20 MG capsule Commonly known as: PRILOSEC TAKE 1 CAPSULE BY MOUTH EVERY DAY        Objective:   BP 105/68   Pulse 91   Temp 98.2 F (36.8 C)   Ht 5\' 2"  (1.575 m)   Wt 137 lb (62.1 kg)   SpO2 96%   BMI 25.06 kg/m   Wt Readings from Last 3 Encounters:  01/29/20 137 lb (62.1 kg)  12/12/19 139 lb 3.2 oz (63.1 kg)  11/27/19 141 lb (64 kg)    Physical Exam Vitals and nursing note reviewed.  Constitutional:      General: She is not in acute distress.    Appearance: She is well-developed. She is not diaphoretic.  Eyes:     Conjunctiva/sclera: Conjunctivae normal.  Cardiovascular:     Rate and Rhythm: Normal rate and regular rhythm.     Heart sounds: Normal heart sounds. No murmur heard.   Pulmonary:     Effort: Pulmonary effort is normal. No respiratory distress.     Breath sounds: Normal breath sounds. No wheezing.  Musculoskeletal:        General: No tenderness. Normal range of motion.  Skin:    General: Skin is warm and dry.     Findings: No rash.  Neurological:     Mental Status: She is alert and oriented to person, place, and time.  Coordination: Coordination normal.  Psychiatric:        Behavior: Behavior normal.       Assessment & Plan:   Problem List Items Addressed This Visit      Endocrine   Hypothyroidism (acquired) - Primary   Relevant Medications   levothyroxine (SYNTHROID) 50 MCG tablet   Other Relevant Orders   Thyroid Panel With TSH   Hypothyroidism due to Hashimoto's thyroiditis   Relevant Medications   levothyroxine (SYNTHROID) 50 MCG tablet   Other Relevant Orders   Thyroid Panel With TSH    Other Visit Diagnoses    Anemia, unspecified type       Relevant Orders   Anemia Profile B   Gastro-esophageal reflux disease without esophagitis       Relevant Medications   omeprazole (PRILOSEC) 20 MG capsule      Continue with current thyroid dose, will check levels today. Follow up  plan: Return in about 2 months (around 03/30/2020), or if symptoms worsen or fail to improve, for Hypothyroidism recheck.  Counseling provided for all of the vaccine components No orders of the defined types were placed in this encounter.   Arville Care, MD [facility] 01/29/2020, 1:07 PM

## 2020-01-30 ENCOUNTER — Telehealth: Payer: Self-pay | Admitting: Family Medicine

## 2020-01-30 LAB — ANEMIA PROFILE B
Basophils Absolute: 0.1 10*3/uL (ref 0.0–0.2)
Basos: 1 %
EOS (ABSOLUTE): 0.1 10*3/uL (ref 0.0–0.4)
Eos: 2 %
Ferritin: 51 ng/mL (ref 15–150)
Folate: 19.2 ng/mL (ref >3.0)
Hematocrit: 40.6 % (ref 34.0–46.6)
Hemoglobin: 13.2 g/dL (ref 11.1–15.9)
Immature Grans (Abs): 0 10*3/uL (ref 0.0–0.1)
Immature Granulocytes: 0 %
Iron Saturation: 25 % (ref 15–55)
Iron: 84 ug/dL (ref 27–159)
Lymphocytes Absolute: 2.2 10*3/uL (ref 0.7–3.1)
Lymphs: 38 %
MCH: 27.7 pg (ref 26.6–33.0)
MCHC: 32.5 g/dL (ref 31.5–35.7)
MCV: 85 fL (ref 79–97)
Monocytes Absolute: 0.5 10*3/uL (ref 0.1–0.9)
Monocytes: 8 %
Neutrophils Absolute: 2.9 10*3/uL (ref 1.4–7.0)
Neutrophils: 51 %
Platelets: 358 10*3/uL (ref 150–450)
RBC: 4.76 x10E6/uL (ref 3.77–5.28)
RDW: 12.6 % (ref 11.7–15.4)
Retic Ct Pct: 1.7 % (ref 0.6–2.6)
Total Iron Binding Capacity: 331 ug/dL (ref 250–450)
UIBC: 247 ug/dL (ref 131–425)
Vitamin B-12: 516 pg/mL (ref 232–1245)
WBC: 5.8 10*3/uL (ref 3.4–10.8)

## 2020-01-30 LAB — THYROID PANEL WITH TSH
Free Thyroxine Index: 2 (ref 1.2–4.9)
T3 Uptake Ratio: 23 % — ABNORMAL LOW (ref 24–39)
T4, Total: 8.8 ug/dL (ref 4.5–12.0)
TSH: 3.47 u[IU]/mL (ref 0.450–4.500)

## 2020-01-30 MED ORDER — BIOTIN 1000 MCG PO TABS: 1000.0000 ug | ORAL_TABLET | Freq: Every day | ORAL | 3 refills | Status: DC

## 2020-01-30 NOTE — Telephone Encounter (Signed)
Informed pt that Levothyroxine and Omeprazole have been sent in.  Pt is requesting that a prescription for her hair be called in. She is also requesting her lab results.  Informed pt that Dr. Louanne Skye needs to review labs and that I would ask him about hair prescription.

## 2020-01-30 NOTE — Telephone Encounter (Signed)
I sent biotin to the patient

## 2020-01-31 NOTE — Telephone Encounter (Signed)
Patient aware and verbalized understanding. °

## 2020-01-31 NOTE — Telephone Encounter (Signed)
LMTCB

## 2020-02-03 ENCOUNTER — Encounter: Payer: Medicaid Other | Admitting: Physical Therapy

## 2020-02-05 ENCOUNTER — Ambulatory Visit: Payer: Medicaid Other | Admitting: Physical Therapy

## 2020-02-07 ENCOUNTER — Other Ambulatory Visit (HOSPITAL_COMMUNITY)
Admission: RE | Admit: 2020-02-07 | Discharge: 2020-02-07 | Disposition: A | Discharge status: Home / Self Care | Payer: Medicaid Other | Source: Ambulatory Visit | Attending: Obstetrics & Gynecology | Admitting: Obstetrics & Gynecology

## 2020-02-07 ENCOUNTER — Ambulatory Visit (INDEPENDENT_AMBULATORY_CARE_PROVIDER_SITE_OTHER): Payer: Medicaid Other | Admitting: Women's Health

## 2020-02-07 ENCOUNTER — Encounter: Payer: Self-pay | Admitting: Women's Health

## 2020-02-07 ENCOUNTER — Other Ambulatory Visit: Payer: Self-pay

## 2020-02-07 VITALS — BP 121/73 | HR 74 | Ht 62.0 in | Wt 138.0 lb

## 2020-02-07 DIAGNOSIS — Z01419 Encounter for gynecological examination (general) (routine) without abnormal findings: Secondary | ICD-10-CM | POA: Diagnosis not present

## 2020-02-07 DIAGNOSIS — R319 Hematuria, unspecified: Secondary | ICD-10-CM | POA: Diagnosis not present

## 2020-02-07 DIAGNOSIS — Z0000 Encounter for general adult medical examination without abnormal findings: Secondary | ICD-10-CM | POA: Diagnosis not present

## 2020-02-07 NOTE — Progress Notes (Signed)
WELL-WOMAN EXAMINATION Patient name: Catherine Haney MRN [id number]  Date of birth: Jan 14, 1991 Chief Complaint:   Gynecologic Exam  History of Present Illness:   Catherine Haney is a 29 y.o. [phone] female being seen today for a routine well-woman exam.  Current complaints: pain like needs to pee, then goes to pee and nothing comes out  Depression screen [facility] 2/9 02/07/2020 01/29/2020 11/27/2019 12/05/2018 07/25/2018  Decreased Interest 0 0 0 0 0  Down, Depressed, Hopeless 0 0 0 0 1  PHQ - 2 Score 0 0 0 0 1  Altered sleeping 0 - - - -  Tired, decreased energy 0 - - - -  Change in appetite 0 - - - -  Feeling bad or failure about yourself  0 - - - -  Trouble concentrating 0 - - - -  Moving slowly or fidgety/restless 0 - - - -  Suicidal thoughts 0 - - - -  PHQ-9 Score 0 - - - -  Difficult doing work/chores - - - - -  Some recent data might be hidden     PCP: Dr. Louanne Skye      does not desire labs Patient's last menstrual period was 01/04/2020. The current method of family planning is IUD, Liletta placed 02/28/19 Last pap 08/20/15. Results were: normal. H/O abnormal pap: no Last mammogram: never. Results were: N/A. Family h/o breast cancer: no Last colonoscopy: never. Results were: N/A. Family h/o colorectal cancer: no Review of Systems:   Pertinent items are noted in HPI Denies any headaches, blurred vision, fatigue, shortness of breath, chest pain, abdominal pain, abnormal vaginal discharge/itching/odor/irritation, problems with periods, bowel movements, urination, or intercourse unless otherwise stated above. Pertinent History Reviewed:  Reviewed past medical,surgical, social and family history.  Reviewed problem list, medications and allergies. Physical Assessment:   Vitals:   02/07/20 0943  BP: 121/73  Pulse: 74  Weight: 138 lb (62.6 kg)  Height: 5\' 2"  (1.575 m)  Body mass index is 25.24 kg/m.        Physical Examination:   General appearance - well appearing, and in no  distress  Mental status - alert, oriented to person, place, and time  Psych:  She has a normal mood and affect  Skin - warm and dry, normal color, no suspicious lesions noted  Chest - effort normal, all lung fields clear to auscultation bilaterally  Heart - normal rate and regular rhythm  Neck:  midline trachea, no thyromegaly or nodules  Breasts - breasts appear normal, no suspicious masses, no skin or nipple changes or  axillary nodes  Abdomen - soft, nontender, nondistended, no masses or organomegaly  Pelvic - VULVA: normal appearing vulva with no masses, tenderness or lesions  VAGINA: normal appearing vagina with normal color and discharge, no lesions  CERVIX: normal appearing cervix without discharge or lesions, no CMT, IUD strings visible, appropriate length  Thin prep pap is done w/ HR HPV cotesting  UTERUS: uterus is felt to be normal size, shape, consistency and nontender   ADNEXA: No adnexal masses or tenderness noted.  Extremities:  No swelling or varicosities noted  Chaperone:    No results found for this or any previous visit (from the past 24 hour(s)).  Assessment & Plan:  1) Well-Woman Exam  2) Hematuria> will send cx, could be kidney stone w/ sx she is having, to push water intake, if worsens see PCP or ED if needed  Labs/procedures today: pap, urine cx  Mammogram @30yo  or sooner  if problems Colonoscopy @30yo  or sooner if problems  Orders Placed This Encounter  Procedures  . Urine Culture    Meds: No orders of the defined types were placed in this encounter.   Follow-up: Return in about 1 year (around 02/06/2021) for Physical.  02/08/2021 CNM, WHNP-BC 02/07/2020 10:07 AM

## 2020-02-09 LAB — URINE CULTURE

## 2020-02-10 ENCOUNTER — Telehealth: Payer: Self-pay | Admitting: *Deleted

## 2020-02-10 ENCOUNTER — Telehealth: Payer: Self-pay | Admitting: Women's Health

## 2020-02-10 LAB — CYTOLOGY - PAP
Comment: NEGATIVE
Diagnosis: NEGATIVE
High risk HPV: NEGATIVE

## 2020-02-10 NOTE — Telephone Encounter (Signed)
Pt would like someone to review urine test results with her.

## 2020-02-10 NOTE — Telephone Encounter (Signed)
LMOVM returning patient's call via interpreter.

## 2020-02-11 ENCOUNTER — Encounter: Payer: Medicaid Other | Admitting: Physical Therapy

## 2020-02-19 ENCOUNTER — Ambulatory Visit: Payer: Medicaid Other | Admitting: Physical Therapy

## 2020-02-20 ENCOUNTER — Encounter: Payer: Self-pay | Admitting: Family Medicine

## 2020-02-20 ENCOUNTER — Ambulatory Visit (INDEPENDENT_AMBULATORY_CARE_PROVIDER_SITE_OTHER): Payer: Medicaid Other | Admitting: Family Medicine

## 2020-02-20 DIAGNOSIS — R3121 Asymptomatic microscopic hematuria: Secondary | ICD-10-CM | POA: Diagnosis not present

## 2020-02-20 DIAGNOSIS — K219 Gastro-esophageal reflux disease without esophagitis: Secondary | ICD-10-CM | POA: Diagnosis not present

## 2020-02-20 MED ORDER — OMEPRAZOLE 20 MG PO CPDR: 20.0000 mg | DELAYED_RELEASE_CAPSULE | Freq: Two times a day (BID) | ORAL | 3 refills | Status: DC

## 2020-02-20 NOTE — Progress Notes (Signed)
Virtual Visit via telephone Note   I connected with Catherine Haney on 02/20/20 at 1018 by telephone and verified that I am speaking with the correct person using two identifiers. Chandrea Zellman is currently located at home and translator phone service are currently with her during visit. The provider, Elige Radon Joseph Bias, MD is located in their office at time of visit.  Call ended at 1035  I discussed the limitations, risks, security and privacy concerns of performing an evaluation and management service by telephone and the availability of in person appointments. I also discussed with the patient that there may be a patient responsible charge related to this service. The patient expressed understanding and agreed to proceed.   History and Present Illness: Patient is calling in for abdominal pain and nausea.  She has been having abdominal pain for the past 2 weeks and has been taking omeprazole. She has epigastric pain that comes and goes and is mostly in the morning. She does eat breakfast late.  It does feel better after she eats sometimes but sometimes feels worse. She is awoken the last 2 nights with this pain.  She feels like it is worsening.   No diagnosis found.  Outpatient Encounter Medications as of 02/20/2020  Medication Sig  . Biotin 1000 MCG tablet Take 1 tablet (1 mg total) by mouth daily.  Marland Kitchen levonorgestrel (MIRENA) 20 MCG/24HR IUD 1 each by Intrauterine route once.  Marland Kitchen levothyroxine (SYNTHROID) 50 MCG tablet Take 1 tablet (50 mcg total) by mouth daily before breakfast.  . omeprazole (PRILOSEC) 20 MG capsule Take 1 capsule (20 mg total) by mouth daily.   No facility-administered encounter medications on file as of 02/20/2020.    Review of Systems  Constitutional: Negative for chills and fever.  Eyes: Negative for visual disturbance.  Respiratory: Negative for chest tightness and shortness of breath.   Cardiovascular: Negative for chest pain and leg swelling.  Gastrointestinal:  Positive for abdominal pain and nausea. Negative for constipation, diarrhea and vomiting.  Musculoskeletal: Negative for back pain and gait problem.  Skin: Negative for rash.  Neurological: Negative for light-headedness and headaches.  Psychiatric/Behavioral: Negative for agitation and behavioral problems.  All other systems reviewed and are negative.   Observations/Objective: Patient sounds comfortable and in no acute distress  Assessment and Plan: Problem List Items Addressed This Visit      Digestive   GERD (gastroesophageal reflux disease) - Primary   Relevant Medications   omeprazole (PRILOSEC) 20 MG capsule   Other Relevant Orders   H Pylori, IGM, IGG, IGA AB   CBC with Differential/Platelet   H. pylori Breath Collection    Other Visit Diagnoses    Gastro-esophageal reflux disease without esophagitis       Relevant Medications   omeprazole (PRILOSEC) 20 MG capsule   Other Relevant Orders   H Pylori, IGM, IGG, IGA AB   CBC with Differential/Platelet   H. pylori Breath Collection   Asymptomatic microscopic hematuria       Relevant Orders   Urinalysis, Complete   Urine Culture       Follow up plan: Return if symptoms worsen or fail to improve.     I discussed the assessment and treatment plan with the patient. The patient was provided an opportunity to ask questions and all were answered. The patient agreed with the plan and demonstrated an understanding of the instructions.   The patient was advised to call back or seek an in-person evaluation if the symptoms worsen  or if the condition fails to improve as anticipated.  The above assessment and management plan was discussed with the patient. The patient verbalized understanding of and has agreed to the management plan. Patient is aware to call the clinic if symptoms persist or worsen. Patient is aware when to return to the clinic for a follow-up visit. Patient educated on when it is appropriate to go to the emergency  department.    I provided 17 minutes of non-face-to-face time during this encounter.    [name], MD

## 2020-02-21 ENCOUNTER — Other Ambulatory Visit: Payer: Self-pay

## 2020-02-21 ENCOUNTER — Other Ambulatory Visit: Payer: Medicaid Other

## 2020-02-21 DIAGNOSIS — K219 Gastro-esophageal reflux disease without esophagitis: Secondary | ICD-10-CM

## 2020-02-21 DIAGNOSIS — R3121 Asymptomatic microscopic hematuria: Secondary | ICD-10-CM | POA: Diagnosis not present

## 2020-02-21 LAB — MICROSCOPIC EXAMINATION
Bacteria, UA: NONE SEEN
RBC, Urine: NONE SEEN /hpf (ref 0–2)

## 2020-02-21 LAB — URINALYSIS, COMPLETE
Bilirubin, UA: NEGATIVE
Glucose, UA: NEGATIVE
Ketones, UA: NEGATIVE
Leukocytes,UA: NEGATIVE
Nitrite, UA: NEGATIVE
Protein,UA: NEGATIVE
Specific Gravity, UA: 1.025 (ref 1.005–1.030)
Urobilinogen, Ur: 0.2 mg/dL (ref 0.2–1.0)
pH, UA: 6 (ref 5.0–7.5)

## 2020-02-25 LAB — CBC WITH DIFFERENTIAL/PLATELET
Basophils Absolute: 0.1 10*3/uL (ref 0.0–0.2)
Basos: 1 %
EOS (ABSOLUTE): 0.1 10*3/uL (ref 0.0–0.4)
Eos: 2 %
Hematocrit: 38.1 % (ref 34.0–46.6)
Hemoglobin: 12.5 g/dL (ref 11.1–15.9)
Immature Grans (Abs): 0 10*3/uL (ref 0.0–0.1)
Immature Granulocytes: 0 %
Lymphocytes Absolute: 2.8 10*3/uL (ref 0.7–3.1)
Lymphs: 45 %
MCH: 27.9 pg (ref 26.6–33.0)
MCHC: 32.8 g/dL (ref 31.5–35.7)
MCV: 85 fL (ref 79–97)
Monocytes Absolute: 0.4 10*3/uL (ref 0.1–0.9)
Monocytes: 7 %
Neutrophils Absolute: 2.8 10*3/uL (ref 1.4–7.0)
Neutrophils: 45 %
Platelets: 341 10*3/uL (ref 150–450)
RBC: 4.48 x10E6/uL (ref 3.77–5.28)
RDW: 12.5 % (ref 11.7–15.4)
WBC: 6.2 10*3/uL (ref 3.4–10.8)

## 2020-02-25 LAB — H PYLORI, IGM, IGG, IGA AB
H pylori, IgM Abs: <9 units (ref 0.0–8.9)
H. pylori, IgA Abs: <9 units (ref 0.0–8.9)
H. pylori, IgG AbS: 0.31 Index Value (ref 0.00–0.79)

## 2020-02-27 LAB — URINE CULTURE

## 2020-03-04 ENCOUNTER — Ambulatory Visit: Payer: Medicaid Other | Admitting: Physical Therapy

## 2020-03-06 ENCOUNTER — Encounter: Payer: Self-pay | Admitting: Physical Therapy

## 2020-03-06 ENCOUNTER — Ambulatory Visit: Payer: Medicaid Other | Attending: Family Medicine | Admitting: Physical Therapy

## 2020-03-06 DIAGNOSIS — M6281 Muscle weakness (generalized): Secondary | ICD-10-CM | POA: Diagnosis not present

## 2020-03-06 DIAGNOSIS — R293 Abnormal posture: Secondary | ICD-10-CM

## 2020-03-06 DIAGNOSIS — M546 Pain in thoracic spine: Secondary | ICD-10-CM | POA: Insufficient documentation

## 2020-03-06 DIAGNOSIS — M542 Cervicalgia: Secondary | ICD-10-CM | POA: Insufficient documentation

## 2020-03-06 NOTE — Therapy (Signed)
[facility] [address] [place], [place], [address] Phone: [phone]   Fax:  [phone]  Physical Therapy Treatment Re-cert  Patient Details  Name: Catherine Haney MRN: [id number] Date of Birth: 1991/01/12 Referring Provider (PT): Arville Care, MD   Encounter Date: 03/06/2020   PT End of Session - 03/06/20 1110    Visit Number 3    Number of Visits 9    Date for PT Re-Evaluation 04/17/20    Authorization Type Medicaid    PT Start Time 0900    PT Stop Time 0945    PT Time Calculation (min) 45 min    Activity Tolerance Patient tolerated treatment well    Behavior During Therapy [facility] for tasks assessed/performed           Past Medical History:  Diagnosis Date   Anemia affecting third pregnancy 10/18/2018   HGB 11.7 --> 9.5 ->9.3 ->Fe++-> 10.1   GERD (gastroesophageal reflux disease)    PROM (premature rupture of membranes) 01/13/2019   Supervision of normal pregnancy 07/10/2018     FAMILY TREE  LAB RESULTS Language Arabic Pap 10/19 per pt [place] PCP Initiated care at 13wk GC/CT Initial: -/-           36wks: Dating by LMP c/w 10wk U/S   Support person [employer] NT/IT:declined CHY:[id number]      MaterniT21:   [place]/HgbE neg Flu vaccine Declined 2/18 CF declined TDaP vaccine declined SMA  Rhogam n/a     Blood Type O/Positive/-- (02/18 1044) Anatomy US Normal female Antibody N    Past Surgical History:  Procedure Laterality Date   DILATION AND CURETTAGE OF UTERUS N/A 08/25/2017   Procedure: SUCTION DILATATION AND CURETTAGE;  Surgeon: Lazaro Arms, MD;  Location: [facility];  Service: Gynecology;  Laterality: N/A;    There were no vitals filed for this visit.   Subjective Assessment - 03/06/20 1055    Subjective COVID-19 screening performed upon arrival. Patient arrives today after not being able to attend therapy since 01/28/2020.              [facility] PT Assessment - 03/06/20 0001      Assessment   Medical Diagnosis  Spasms of the neck, upper trap    Referring Provider (PT) Arville Care, MD    Hand Dominance Right    Next MD Visit 01/29/2020    Prior Therapy yes      Precautions   Precautions None      AROM   Cervical Flexion 50   increaed pain in upper throcic spine and lower cervical spin   Cervical Extension 28   lower cervical and upper trap pain   Cervical - Right Side Bend 36    Cervical - Left Side Bend 28    Cervical - Right Rotation 60    Cervical - Left Rotation 70      Strength   Right Shoulder Flexion 4/5    Right Shoulder Extension 4/5    Right Shoulder Internal Rotation 4+/5    Right Shoulder External Rotation 4+/5    Left Shoulder Flexion 4/5    Left Shoulder Extension 4/5    Left Shoulder Internal Rotation 4+/5    Left Shoulder External Rotation 4+/5                         [facility] Adult PT Treatment/Exercise - 03/06/20 0001      Exercises   Exercises Neck  Neck Exercises: Machines for Strengthening   UBE (Upper Arm Bike) 120 RPM 6 mins (4 fwd, 4 bwd)      Neck Exercises: Theraband   Other Theraband Exercises lat pull down blue XTS seated 2x10    Other Theraband Exercises rows with green XTS x 15, pt reporting fatigue      Neck Exercises: Prone   Shoulder Extension 20 reps      Manual Therapy   Manual Therapy Soft tissue mobilization    Manual therapy comments tennis ball self mobs against the wall, pt was edu   5 minutes   Soft tissue mobilization Active trigger points noted in bilateral upper trap, manual release performed, STM: over middle and upper trap and cerival paraspinals      Neck Exercises: Stretches   Upper Trapezius Stretch Right;Left;3 reps;10 seconds    Other Neck Stretches Door stretch: 90 degrees 4 x holding 10-15 seconds                  PT Education - 03/06/20 0916    Education Details It was explained importance of attending therapy consistently. Pt in agreement with coming once/week for 6 more weeks.     Person(s) Educated Patient    Methods Explanation    Comprehension Verbalized understanding            PT Short Term Goals - 01/28/20 0807      PT SHORT TERM GOAL #1   Title Patient will be independent with HEP    Baseline no knowledge of exercises    Time 3    Period Weeks    Status On-going             PT Long Term Goals - 03/06/20 1102      PT LONG TERM GOAL #1   Title Patient will be independent with advanced HEP.    Baseline knowlege of HEP    Time 6    Period Weeks    Status On-going      PT LONG TERM GOAL #2   Title Patient will report ability to peform ADLs and caregiving tasks with neck pain less than or equal to 5/10.    Baseline 8/10 pain today    Time 6    Period Weeks    Status On-going      PT LONG TERM GOAL #3   Title Patient will demonstrate 70 degrees of left cervical rotation AROM to improve ability to perform home tasks and driving.    Baseline see flow sheets    Time 6    Period Weeks    Status On-going                 Plan - 03/06/20 1056    Clinical Impression Statement Pt arriving to therapy reproting 8/10 pain in bilateral shoulders, cerivcal spine. Tender to palpation in bilateral cervical paraspinals, upper and mid trap. Pt with active trigger points noted in bilateral upper trap. We discussed dry needling and pt was shown video and was issued a handout to read over at home and sign for consent. Pt was instructed in self tissue mobilization using a tennis ball which pt reported mild relief. Pt was edu reguarding consistency of therapy visits to receive maximum benefits of therpay. Pt agreeing to attend visits once a week for additional 6 weeks. Continue skilled PT per MD agreement.    Personal Factors and Comorbidities Age;Comorbidity 2    Examination-Activity Limitations Bend;Carry;Caring for Others;Lift  Examination-Participation Restrictions Cleaning;Meal Prep;Laundry    Stability/Clinical Decision Making Stable/Uncomplicated      Rehab Potential Fair    PT Frequency 1x / week    PT Duration 6 weeks    PT Treatment/Interventions ADLs/Self Care Home Management;Cryotherapy;Electrical Stimulation;Moist Heat;Traction;Ultrasound;Neuromuscular re-education;Manual techniques;Spinal Manipulations;Therapeutic exercise;Patient/family education    PT Next Visit Plan UBE, thoracic and cervical mobilization, traction to cervical spine as tolerated. Modalities PRN for pain relief.    PT Home Exercise Plan tennis ball self tissue mobs, rows, shoulder extension, "w's", throacic extension    Consulted and Agree with Plan of Care Patient           Patient will benefit from skilled therapeutic intervention in order to improve the following deficits and impairments:  Decreased activity tolerance, Decreased strength, Decreased range of motion, Pain, Impaired UE functional use, Increased muscle spasms, Postural dysfunction  Visit Diagnosis: Cervicalgia - Plan: PT plan of care cert/re-cert  Abnormal posture - Plan: PT plan of care cert/re-cert  Pain in thoracic spine - Plan: PT plan of care cert/re-cert  Muscle weakness (generalized) - Plan: PT plan of care cert/re-cert     Problem List Patient Active Problem List   Diagnosis Date Noted   Hypothyroidism due to Hashimoto's thyroiditis 12/12/2019   Dyspareunia, female 10/10/2019   Prolonged periods 10/10/2019   Chronic fatigue and malaise 08/14/2019   Encounter for IUD insertion 02/28/2019   Atypical chest pain 10/17/2017   Hyperlipidemia 08/27/2015   GERD (gastroesophageal reflux disease) 08/20/2015   Generalized anxiety disorder 07/24/2015   Other seasonal allergic rhinitis 03/18/2015   Chronic pain of multiple joints 03/18/2015    Sharmon Leyden, PT, MPT 03/06/2020, 11:12 AM  [facility] [address] [place], [place], [address] Phone: [phone]   Fax:  [phone]  Name: Haila Dena MRN:  [id number] Date of Birth: [birth year]

## 2020-03-13 ENCOUNTER — Other Ambulatory Visit: Payer: Self-pay

## 2020-03-13 ENCOUNTER — Encounter: Payer: Self-pay | Admitting: Physical Therapy

## 2020-03-13 ENCOUNTER — Ambulatory Visit: Payer: Medicaid Other | Admitting: Physical Therapy

## 2020-03-13 DIAGNOSIS — M6281 Muscle weakness (generalized): Secondary | ICD-10-CM

## 2020-03-13 DIAGNOSIS — M546 Pain in thoracic spine: Secondary | ICD-10-CM | POA: Diagnosis not present

## 2020-03-13 DIAGNOSIS — M542 Cervicalgia: Secondary | ICD-10-CM | POA: Diagnosis not present

## 2020-03-13 DIAGNOSIS — R293 Abnormal posture: Secondary | ICD-10-CM

## 2020-03-13 NOTE — Therapy (Addendum)
[facility] [place], [place], [address] Phone: [phone]   Fax:  [phone]  Physical Therapy Treatment PHYSICAL THERAPY DISCHARGE SUMMARY  Visits from Start of Care: 4  Current functional level related to goals / functional outcomes: See below   Remaining deficits: See goals   Education / Equipment: HEP Plan: Patient agrees to discharge.  Patient goals were not met. Patient is being discharged due to not returning since the last visit.  ?????    Gabriela Eves, PT, DPT  Patient Details  Name: Catherine Haney MRN: [id number] Date of Birth: 02/19/1991 Referring Provider (PT): Caryl Pina, MD   Encounter Date: 03/13/2020   PT End of Session - 03/13/20 0742    Visit Number 4    Number of Visits 9    Date for PT Re-Evaluation 04/17/20    Authorization Type Medicaid [employer]    PT Start Time 0738    PT Stop Time 0815    PT Time Calculation (min) 37 min    Activity Tolerance Patient tolerated treatment well    Behavior During Therapy [facility] for tasks assessed/performed           Past Medical History:  Diagnosis Date  . Anemia affecting third pregnancy 10/18/2018   HGB 11.7 --> 9.5 ->9.3 ->Fe++-> 10.1  . GERD (gastroesophageal reflux disease)   . PROM (premature rupture of membranes) 01/13/2019  . Supervision of normal pregnancy 07/10/2018     FAMILY TREE  LAB RESULTS Language Arabic Pap 10/19 per pt [facility] PCP Initiated care at 13wk GC/CT Initial: -/-           36wks: Dating by LMP c/w 10wk U/S   Support person [employer] NT/IT:declined DVV:[id number]      MaterniT21:   [place]/HgbE neg Flu vaccine Declined 2/18 CF declined TDaP vaccine declined SMA  Rhogam n/a     Blood Type O/Positive/-- (02/18 1044) Anatomy US Normal female Antibody N    Past Surgical History:  Procedure Laterality Date  . DILATION AND CURETTAGE OF UTERUS N/A 08/25/2017   Procedure: SUCTION DILATATION AND CURETTAGE;  Surgeon: Florian Buff, MD;  Location: [facility];  Service: Gynecology;  Laterality: N/A;    There were no vitals filed for this visit.   Subjective Assessment - 03/13/20 0742    Subjective COVID-19 screening performed upon arrival. Patient reports feeling better    Pertinent History hypothyroidism, GERD    Limitations Lifting;House hold activities;Other (comment)    Patient Stated Goals decrease pain    Currently in Pain? Yes   did not provide number on pain scale   Pain Score 7     Pain Location Neck    Pain Orientation Lower    Pain Type Chronic pain              [facility] PT Assessment - 03/13/20 0001      Assessment   Medical Diagnosis Spasms of the neck, upper trap    Referring Provider (PT) Caryl Pina, MD    Hand Dominance Right    Next MD Visit 01/29/2020    Prior Therapy yes      Precautions   Precautions None                         [facility] Adult PT Treatment/Exercise - 03/13/20 0001      Exercises   Exercises Neck      Neck Exercises: Machines for Strengthening   UBE (  Upper Arm Bike) 120 RPM 8 mins (4 fwd, 4 bwd)      Neck Exercises: Theraband   Other Theraband Exercises chop lift blue XTS x30    Other Theraband Exercises low rows, high rows x30 each      Neck Exercises: Standing   Other Standing Exercises horizontal abduction with chin tuck x30    Other Standing Exercises W's x30       Neck Exercises: Supine   Other Supine Exercise self throacic mobs over foam roll x2 minutes    Other Supine Exercise 2# serratus anterior punches x20 each      Neck Exercises: Prone   Other Prone Exercise bent over row 2# x20; bent over extension 2# x20      Neck Exercises: Stretches   Upper Trapezius Stretch Right;Left;3 reps;10 seconds    Levator Stretch Right;Left;3 reps;10 seconds    Corner Stretch 3 reps;10 seconds    Other Neck Stretches cross body stretch 3x 10 seconds each                    PT Short Term Goals - 01/28/20 2706      PT SHORT  TERM GOAL #1   Title Patient will be independent with HEP    Baseline no knowledge of exercises    Time 3    Period Weeks    Status On-going             PT Long Term Goals - 03/06/20 1102      PT LONG TERM GOAL #1   Title Patient will be independent with advanced HEP.    Baseline knowlege of HEP    Time 6    Period Weeks    Status On-going      PT LONG TERM GOAL #2   Title Patient will report ability to peform ADLs and caregiving tasks with neck pain less than or equal to 5/10.    Baseline 8/10 pain today    Time 6    Period Weeks    Status On-going      PT LONG TERM GOAL #3   Title Patient will demonstrate 70 degrees of left cervical rotation AROM to improve ability to perform home tasks and driving.    Baseline see flow sheets    Time 6    Period Weeks    Status On-going                 Plan - 03/13/20 0749    Clinical Impression Statement Patient responded well to therapy session though still with UT, upper thoracic and cervical pain. Patient guided through exercises to which she demostrated good form with all. Patient and PT discussed continuing HEP to which she reported understanding.    Personal Factors and Comorbidities Age;Comorbidity 2    Examination-Activity Limitations Bend;Carry;Caring for Others;Lift    Examination-Participation Restrictions Cleaning;Meal Prep;Laundry    Stability/Clinical Decision Making Stable/Uncomplicated    Clinical Decision Making Low    Rehab Potential Fair    PT Frequency 1x / week    PT Duration 6 weeks    PT Treatment/Interventions ADLs/Self Care Home Management;Cryotherapy;Electrical Stimulation;Moist Heat;Traction;Ultrasound;Neuromuscular re-education;Manual techniques;Spinal Manipulations;Therapeutic exercise;Patient/family education    PT Next Visit Plan UBE, thoracic and cervical mobilization, traction to cervical spine as tolerated. Modalities PRN for pain relief.    PT Home Exercise Plan tennis ball self tissue  mobs, rows, shoulder extension, "w's", throacic extension    Consulted and Agree with Plan of Care Patient  Patient will benefit from skilled therapeutic intervention in order to improve the following deficits and impairments:  Decreased activity tolerance, Decreased strength, Decreased range of motion, Pain, Impaired UE functional use, Increased muscle spasms, Postural dysfunction  Visit Diagnosis: Cervicalgia  Abnormal posture  Pain in thoracic spine  Muscle weakness (generalized)     Problem List Patient Active Problem List   Diagnosis Date Noted  . Hypothyroidism due to Hashimoto's thyroiditis 12/12/2019  . Dyspareunia, female 10/10/2019  . Prolonged periods 10/10/2019  . Chronic fatigue and malaise 08/14/2019  . Encounter for IUD insertion 02/28/2019  . Atypical chest pain 10/17/2017  . Hyperlipidemia 08/27/2015  . GERD (gastroesophageal reflux disease) 08/20/2015  . Generalized anxiety disorder 07/24/2015  . Other seasonal allergic rhinitis 03/18/2015  . Chronic pain of multiple joints 03/18/2015    Gabriela Eves, PT, DPT 03/13/2020, 8:16 AM  [facility] [address] [place], [place], [address] Phone: [phone]   Fax:  [phone]  Name: Catherine Haney MRN: [id number] Date of Birth: [birth year]

## 2020-03-16 ENCOUNTER — Other Ambulatory Visit: Payer: Self-pay

## 2020-03-16 DIAGNOSIS — E063 Autoimmune thyroiditis: Secondary | ICD-10-CM | POA: Diagnosis not present

## 2020-03-16 DIAGNOSIS — E038 Other specified hypothyroidism: Secondary | ICD-10-CM | POA: Diagnosis not present

## 2020-03-17 ENCOUNTER — Ambulatory Visit (INDEPENDENT_AMBULATORY_CARE_PROVIDER_SITE_OTHER): Payer: Medicaid Other | Admitting: Nurse Practitioner

## 2020-03-17 ENCOUNTER — Encounter: Payer: Self-pay | Admitting: Nurse Practitioner

## 2020-03-17 ENCOUNTER — Other Ambulatory Visit: Payer: Self-pay

## 2020-03-17 VITALS — BP 104/71 | HR 71 | Ht 62.0 in | Wt 137.4 lb

## 2020-03-17 DIAGNOSIS — E038 Other specified hypothyroidism: Secondary | ICD-10-CM | POA: Diagnosis not present

## 2020-03-17 DIAGNOSIS — E063 Autoimmune thyroiditis: Secondary | ICD-10-CM | POA: Diagnosis not present

## 2020-03-17 LAB — T4, FREE: Free T4: 1.4 ng/dL (ref 0.8–1.8)

## 2020-03-17 LAB — TSH: TSH: 1.29 mIU/L

## 2020-03-17 NOTE — Progress Notes (Signed)
03/17/2020, 9:35 AM  Endocrinology follow-up note  SUBJECTIVE:  Catherine Haney is a 29 y.o.-year-old female patient being seen in follow-up after she was seen consultation for hypothyroidism referred by Dettinger, Fransisca Kaufmann, MD.   Past Medical History:  Diagnosis Date  . Anemia affecting third pregnancy 10/18/2018   HGB 11.7 --> 9.5 ->9.3 ->Fe++-> 10.1  . GERD (gastroesophageal reflux disease)   . PROM (premature rupture of membranes) 01/13/2019  . Supervision of normal pregnancy 07/10/2018     FAMILY [facility]REE  LAB RESULTS Language Arabic Pap 10/19 per pt [place] PCP Initiated care at 13wk GC/CT Initial: -/-           36wks: Dating by LMP c/w 10wk U/S   Support person [facility] [employer] NT/IT:declined VFI:EPPIRJJO      MaterniT21:   [place]/HgbE neg Flu vaccine Declined 2/18 CF declined TDaP vaccine declined SMA  Rhogam n/a     Blood Type O/Positive/-- (02/18 1044) Anatomy US Normal female Antibody N    Past Surgical History:  Procedure Laterality Date  . DILATION AND CURETTAGE OF UTERUS N/A 08/25/2017   Procedure: SUCTION DILATATION AND CURETTAGE;  Surgeon: Florian Buff, MD;  Location: [facility];  Service: Gynecology;  Laterality: N/A;    Social History   Socioeconomic History  . Marital status: Married    Spouse name: Abdou  . Number of children: 3  . Years of education: Not on file  . Highest education level: Not on file  Occupational History  . Not on file  Tobacco Use  . Smoking status: Never Smoker  . Smokeless tobacco: Never Used  Vaping Use  . Vaping Use: Never used  Substance and Sexual Activity  . Alcohol use: Never    Alcohol/week: 0.0 standard drinks  . Drug use: Never  . Sexual activity: Yes    Birth control/protection: I.U.D.  Other Topics Concern  . Not on file  Social History Narrative  . Not on file   Social Determinants of Health   Financial Resource  Strain: Low Risk   . Difficulty of Paying Living Expenses: Not hard at all  Food Insecurity: Food Insecurity Present  . Worried About Charity fundraiser in the Last Year: Sometimes true  . Ran Out of Food in the Last Year: Never true  Transportation Needs: No Transportation Needs  . Lack of Transportation (Medical): No  . Lack of Transportation (Non-Medical): No  Physical Activity: Insufficiently Active  . Days of Exercise per Week: 3 days  . Minutes of Exercise per Session: 40 min  Stress: No Stress Concern Present  . Feeling of Stress : Not at all  Social Connections: Moderately Integrated  . Frequency of Communication with Friends and Family: More than three times a week  . Frequency of Social Gatherings with Friends and Family: Patient refused  . Attends Religious Services: More  than 4 times per year  . Active Member of Clubs or Organizations: No  . Attends Archivist Meetings: Never  . Marital Status: Married    Family History  Problem Relation Age of Onset  . Hypertension Mother   . Diabetes Mother   . Diabetes Father   . Hypertension Paternal Grandfather     Outpatient Encounter Medications as of 03/17/2020  Medication Sig  . levothyroxine (SYNTHROID) 50 MCG tablet Take 1 tablet (50 mcg total) by mouth daily before breakfast.  . omeprazole (PRILOSEC) 20 MG capsule Take 1 capsule (20 mg total) by mouth 2 (two) times daily before a meal.  . Biotin 1000 MCG tablet Take 1 tablet (1 mg total) by mouth daily. (Patient not taking: Reported on 03/17/2020)  . levonorgestrel (MIRENA) 20 MCG/24HR IUD 1 each by Intrauterine route once. (Patient not taking: Reported on 03/17/2020)   No facility-administered encounter medications on file as of 03/17/2020.    ALLERGIES: No Known Allergies VACCINATION STATUS: Immunization History  Administered Date(s) Administered  . Hepatitis B 07/25/2007, 04/03/2013  . IPV 07/25/2007  . Influenza-Unspecified 04/03/2013, 02/26/2014   . MMR 07/25/2007  . Td 07/25/2007, 04/03/2013  . Tdap 07/25/2007, 04/03/2013, 10/11/2017  . Varicella 07/25/2007     HPI   She is being seen with Arabic language interpreter: Tasia Catchings  is a patient with the above medical history. she was diagnosed  with postpartum thyroiditis with significantly elevated TSH suggestive of hypothyroidism in March 2021. She was subsequently diagnosed with primary hypothyroidism which required initiation of thyroid hormone supplement. She took levothyroxine 50 mcg with correction of her thyroid function test towards target continue she decided to withdraw levothyroxine at the beginning of July after her labs showed target TSH in total T4. -Her most recent thyroid function tests shows no higher TSH at 5.1 and low normal free T4 at 1.0.   -Her labs also confirm Hashimoto's thyroiditis as a background. -She has no family history of thyroid dysfunction.  She denies dysphagia, shortness of breath, nor voice change.  She denies any prior history of thyroid dysfunction. No family history of thyroid cancer.  No history of radiation therapy to head or neck.  No recent use of iodine supplements.  She has no specific complaints today, is feeling much better now that she restarted her medication.  Review of systems  Constitutional: + Minimally fluctuating body weight,  current Body mass index is 25.13 kg/m. , no fatigue, no subjective hyperthermia, no subjective hypothermia Eyes: no blurry vision, no xerophthalmia ENT: no sore throat, no nodules palpated in throat, no dysphagia/odynophagia, no hoarseness Cardiovascular: no chest pain, no shortness of breath, no palpitations, no leg swelling Respiratory: no cough, no shortness of breath Gastrointestinal: no nausea/vomiting/diarrhea Musculoskeletal: no muscle/joint aches Skin: no rashes, no hyperemia Neurological: no tremors, no numbness, no tingling, no dizziness Psychiatric: no depression, no  anxiety   OBJECTIVE:  Physical Exam: BP 104/71 (BP Location: Right Arm, Patient Position: Sitting)   Pulse 71   Ht _0  (1.575 m)   Wt 137 lb 6.4 oz (62.3 kg)   BMI 25.13 kg/m  Wt Readings from Last 3 Encounters:  03/17/20 137 lb 6.4 oz (62.3 kg)  02/07/20 138 lb (62.6 kg)  01/29/20 137 lb (62.1 kg)   BP Readings from Last 3 Encounters:  03/17/20 104/71  02/07/20 121/73  01/29/20 105/68    Physical Exam- Limited  Constitutional:  Body mass index is 25.13 kg/m. , not in acute  distress, normal state of mind Eyes:  EOMI, no exophthalmos Neck: Supple Thyroid: No gross goiter Cardiovascular: RRR, no murmers, rubs, or gallops, no edema Respiratory: Adequate breathing efforts, no crackles, rales, rhonchi, or wheezing Musculoskeletal: no gross deformities, strength intact in all four extremities, no gross restriction of joint movements Skin:  no rashes, no hyperemia Neurological: no tremor with outstretched hands  CMP ( most recent) CMP     Component Value Date/Time   NA 136 08/15/2019 0807   K 4.5 08/15/2019 0807   CL 102 08/15/2019 0807   CO2 21 08/15/2019 0807   GLUCOSE 90 08/15/2019 0807   GLUCOSE 94 10/30/2018 0555   BUN 16 08/15/2019 0807   CREATININE 0.61 08/15/2019 0807   CALCIUM 8.8 08/15/2019 0807   PROT 7.9 08/15/2019 0807   ALBUMIN 4.5 08/15/2019 0807   AST 25 08/15/2019 0807   ALT 20 08/15/2019 0807   ALKPHOS 93 08/15/2019 0807   BILITOT <0.2 08/15/2019 0807   GFRNONAA 123 08/15/2019 0807   GFRAA 142 08/15/2019 0807     Diabetic Labs (most recent): No results found for: HGBA1C   Lipid Panel ( most recent) Lipid Panel     Component Value Date/Time   CHOL 199 08/20/2015 1116   TRIG 56 08/20/2015 1116   HDL 47 08/20/2015 1116   CHOLHDL 4.2 08/20/2015 1116   LDLCALC 141 (H) 08/20/2015 1116   LABVLDL 11 08/20/2015 1116   Recent Results (from the past 2160 hour(s))  Anemia Profile B     Status: None   Collection Time: 01/29/20  1:36 PM   Result Value Ref Range   Total Iron Binding Capacity 331 250 - 450 ug/dL   UIBC 247 131 - 425 ug/dL   Iron 84 27 - 159 ug/dL   Iron Saturation 25 15 - 55 %   Ferritin 51 15.0 - 150.0 ng/mL   Vitamin B-12 516 232 - 1,245 pg/mL   Folate 19.2 >3.0 ng/mL    Comment: A serum folate concentration of less than 3.1 ng/mL is considered to represent clinical deficiency.    WBC 5.8 3.4 - 10.8 x10E3/uL   RBC 4.76 3.77 - 5.28 x10E6/uL   Hemoglobin 13.2 11.1 - 15.9 g/dL   Hematocrit 40.6 34.0 - 46.6 %   MCV 85 79 - 97 fL   MCH 27.7 26.6 - 33.0 pg   MCHC 32.5 31 - 35 g/dL   RDW 12.6 11.7 - 15.4 %   Platelets 358 150 - 450 x10E3/uL   Neutrophils 51 Not Estab. %   Lymphs 38 Not Estab. %   Monocytes 8 Not Estab. %   Eos 2 Not Estab. %   Basos 1 Not Estab. %   Neutrophils Absolute 2.9 1.40 - 7.00 x10E3/uL   Lymphocytes Absolute 2.2 0 - 3 x10E3/uL   Monocytes Absolute 0.5 0 - 0 x10E3/uL   EOS (ABSOLUTE) 0.1 0.0 - 0.4 x10E3/uL   Basophils Absolute 0.1 0 - 0 x10E3/uL   Immature Granulocytes 0 Not Estab. %   Immature Grans (Abs) 0.0 0.0 - 0.1 x10E3/uL   Retic Ct Pct 1.7 0.6 - 2.6 %  Thyroid Panel With TSH     Status: Abnormal   Collection Time: 01/29/20  1:36 PM  Result Value Ref Range   TSH 3.470 0.450 - 4.500 uIU/mL   T4, Total 8.8 4.5 - 12.0 ug/dL   T3 Uptake Ratio 23 (L) 24 - 39 %   Free Thyroxine Index 2.0 1.2 - 4.9  Cytology -  PAP( [facility])     Status: None   Collection Time: 02/07/20  9:46 AM  Result Value Ref Range   High risk HPV Negative    Adequacy      Satisfactory for evaluation; transformation zone component PRESENT.   Diagnosis      - Negative for intraepithelial lesion or malignancy (NILM)   Comment Normal Reference Range HPV - Negative   Urine Culture     Status: None   Collection Time: 02/07/20 11:37 AM   Specimen: Urine   UR  Result Value Ref Range   Urine Culture, Routine Final report    Organism ID, Bacteria Comment     Comment: Culture shows less than  10,000 colony forming units of bacteria per milliliter of urine. This colony count is not generally considered to be clinically significant.   H Pylori, IGM, IGG, IGA AB     Status: None   Collection Time: 02/21/20 12:19 PM  Result Value Ref Range   H. pylori, IgG AbS 0.31 0.00 - 0.79 Index Value    Comment:                              Negative           <0.80                              Equivocal    0.80 - 0.89                              Positive           >0.89    H. pylori, IgA Abs <9.0 0.0 - 8.9 units    Comment:                                 Negative          <9.0                                 Equivocal   9.0 - 11.0                                 Positive         >11.0    H pylori, IgM Abs <9.0 0.0 - 8.9 units    Comment:                                 Negative          <9.0                                 Equivocal   9.0 - 11.0                                 Positive         >11.0 This test was developed and its performance characteristics determined by [facility]. It has not been cleared or approved by  the Food and Drug Administration.   CBC with Differential/Platelet     Status: None   Collection Time: 02/21/20 12:19 PM  Result Value Ref Range   WBC 6.2 3.4 - 10.8 x10E3/uL   RBC 4.48 3.77 - 5.28 x10E6/uL   Hemoglobin 12.5 11.1 - 15.9 g/dL   Hematocrit 38.1 34.0 - 46.6 %   MCV 85 79 - 97 fL   MCH 27.9 26.6 - 33.0 pg   MCHC 32.8 31 - 35 g/dL   RDW 12.5 11.7 - 15.4 %   Platelets 341 150 - 450 x10E3/uL   Neutrophils 45 Not Estab. %   Lymphs 45 Not Estab. %   Monocytes 7 Not Estab. %   Eos 2 Not Estab. %   Basos 1 Not Estab. %   Neutrophils Absolute 2.8 1.40 - 7.00 x10E3/uL   Lymphocytes Absolute 2.8 0 - 3 x10E3/uL   Monocytes Absolute 0.4 0 - 0 x10E3/uL   EOS (ABSOLUTE) 0.1 0.0 - 0.4 x10E3/uL   Basophils Absolute 0.1 0 - 0 x10E3/uL   Immature Granulocytes 0 Not Estab. %   Immature Grans (Abs) 0.0 0.0 - 0.1 x10E3/uL  Urine Culture     Status: None    Collection Time: 02/21/20  1:01 PM   Specimen: Urine   UR  Result Value Ref Range   Urine Culture, Routine Final report    Organism ID, Bacteria Lactobacillus species     Comment: 10,000-25,000 colony forming units per mL Susceptibility not normally performed on this organism.   Urinalysis, Complete     Status: Abnormal   Collection Time: 02/21/20  1:01 PM  Result Value Ref Range   Specific Gravity, UA 1.025 1.005 - 1.030   pH, UA 6.0 5.0 - 7.5   Color, UA Yellow Yellow   Appearance Ur Clear Clear   Leukocytes,UA Negative Negative   Protein,UA Negative Negative/Trace   Glucose, UA Negative Negative   Ketones, UA Negative Negative   RBC, UA Trace (A) Negative   Bilirubin, UA Negative Negative   Urobilinogen, Ur 0.2 0.2 - 1.0 mg/dL   Nitrite, UA Negative Negative   Microscopic Examination See below:   Microscopic Examination     Status: None   Collection Time: 02/21/20  1:01 PM   Urine  Result Value Ref Range   WBC, UA 0-5 0 - 5 /hpf   RBC None seen 0 - 2 /hpf   Epithelial Cells (non renal) 0-10 0 - 10 /hpf   Bacteria, UA None seen None seen/Few  TSH     Status: None   Collection Time: 03/16/20  9:00 AM  Result Value Ref Range   TSH 1.29 mIU/L    Comment:           Reference Range .           > or = 20 Years  0.40-4.50 .                Pregnancy Ranges           First trimester    0.26-2.66           Second trimester   0.55-2.73           Third trimester    0.43-2.91   T4, Free     Status: None   Collection Time: 03/16/20  9:00 AM  Result Value Ref Range   Free T4 1.4 0.8 - 1.8 ng/dL       ASSESSMENT / PLAN:  1.  Hypothyroidism r/t Hashimoto's thyroiditis  -Her previsit Thyroid function tests are consistent with appropriate hormone replacement.  She is advised to continue Levothyroxine 50 mcg po daily before breakfast.  -I had a long discussion with her through her interpreter that she will need thyroid hormone supplement for the long term.   - We discussed  about the correct intake of her thyroid hormone, on empty stomach at fasting, with water, separated by at least 30 minutes from breakfast and other medications,  and separated by more than 4 hours from calcium, iron, multivitamins, acid reflux medications (PPIs). -Patient is made aware of the fact that thyroid hormone replacement is needed for life, dose to be adjusted by periodic monitoring of thyroid function tests.   I used Arabic language interpreter, Mariane Duval, to convey my decisions to her.      - Time spent on this patient care encounter:  20 minutes of which 50% was spent in  counseling and the rest reviewing  her current and  previous labs / studies and medications  doses and developing a plan for long term care. [name]  participated in the discussions, expressed understanding, and voiced agreement with the above plans.  All questions were answered to her satisfaction. she is encouraged to contact clinic should she have any questions or concerns prior to her return visit.    FOLLOW UP PLAN: Return in about 4 months (around 07/18/2020) for Thyroid follow up, Previsit labs.    Rayetta Pigg, [facility] [address] [place], [place] [address] Phone: [phone] Fax: [phone]  03/17/2020, 9:35 AM  This note was partially dictated with voice recognition software. Similar sounding words can be transcribed inadequately or may not  be corrected upon review.

## 2020-03-17 NOTE — Patient Instructions (Signed)

## 2020-03-18 ENCOUNTER — Encounter: Payer: Self-pay | Admitting: Family Medicine

## 2020-03-30 MED ORDER — IPRATROPIUM BROMIDE 0.06 % NA SOLN: 2.0000 | Freq: Four times a day (QID) | NASAL | 12 refills | Status: DC

## 2020-05-08 ENCOUNTER — Ambulatory Visit (INDEPENDENT_AMBULATORY_CARE_PROVIDER_SITE_OTHER): Payer: Medicaid Other | Admitting: Family Medicine

## 2020-05-08 ENCOUNTER — Other Ambulatory Visit: Payer: Self-pay

## 2020-05-08 ENCOUNTER — Encounter: Payer: Self-pay | Admitting: Family Medicine

## 2020-05-08 VITALS — BP 127/76 | HR 88 | Ht 62.0 in | Wt 137.0 lb

## 2020-05-08 DIAGNOSIS — E038 Other specified hypothyroidism: Secondary | ICD-10-CM

## 2020-05-08 DIAGNOSIS — E782 Mixed hyperlipidemia: Secondary | ICD-10-CM | POA: Diagnosis not present

## 2020-05-08 DIAGNOSIS — K219 Gastro-esophageal reflux disease without esophagitis: Secondary | ICD-10-CM | POA: Diagnosis not present

## 2020-05-08 DIAGNOSIS — Z205 Contact with and (suspected) exposure to viral hepatitis: Secondary | ICD-10-CM

## 2020-05-08 DIAGNOSIS — E063 Autoimmune thyroiditis: Secondary | ICD-10-CM | POA: Diagnosis not present

## 2020-05-08 MED ORDER — LEVOTHYROXINE SODIUM 50 MCG PO TABS: 50.0000 ug | ORAL_TABLET | Freq: Every day | ORAL | 3 refills | Status: AC

## 2020-05-08 NOTE — Progress Notes (Signed)
BP 127/76   Pulse 88   Ht '5\' 2"'  (1.575 m)   Wt 137 lb (62.1 kg)   SpO2 100%   BMI 25.06 kg/m    Subjective:   Patient ID: Catherine Haney, female    DOB: Jun 10, 1990, 29 y.o.   MRN: [id number]  HPI: Jhane Lorio is a 29 y.o. female presenting on 05/08/2020 for Medical Management of Chronic Issues, Hypothyroidism,   HPI Hypothyroidism recheck Patient is coming in for thyroid recheck today as well. They deny any issues with hair changes or heat or cold problems or diarrhea or constipation. They deny any chest pain or palpitations. They are currently on levothyroxine 27mcrograms   Hyperlipidemia Patient is coming in for recheck of his hyperlipidemia. The patient is currently taking no medication currently.. They deny any issues with myalgias or history of liver damage from it. They deny any focal numbness or weakness or chest pain.   GERD Patient is currently on no medication, had been on omeprazole but is still having some issues. She denies any blood in her stool or lightheadedness or dizziness.   Patient's husband has hepatitis B and she wants to be retested for possible exposure.  She has never tested positive before.  Relevant past medical, surgical, family and social history reviewed and updated as indicated. Interim medical history since our last visit reviewed. Allergies and medications reviewed and updated.  Review of Systems  Constitutional: Positive for fatigue. Negative for chills and fever.  Eyes: Negative for visual disturbance.  Respiratory: Negative for chest tightness and shortness of breath.   Cardiovascular: Negative for chest pain and leg swelling.  Musculoskeletal: Negative for back pain and gait problem.  Skin: Negative for rash.  Neurological: Negative for light-headedness and headaches.  Psychiatric/Behavioral: Negative for agitation and behavioral problems.  All other systems reviewed and are negative.   Per HPI unless specifically indicated  above   Allergies as of 05/08/2020   No Known Allergies     Medication List       Accurate as of May 08, 2020 11:59 PM. If you have any questions, ask your nurse or doctor.        STOP taking these medications   Biotin 1000 MCG tablet Stopped by: JFransisca KaufmannDettinger, MD   ipratropium 0.06 % nasal spray Commonly known as: ATROVENT Stopped by: JWorthy Rancher MD   omeprazole 20 MG capsule Commonly known as: PRILOSEC Stopped by: JWorthy Rancher MD     TAKE these medications   levonorgestrel 20 MCG/24HR IUD Commonly known as: MIRENA 1 each by Intrauterine route once.   levothyroxine 50 MCG tablet Commonly known as: SYNTHROID Take 1 tablet (50 mcg total) by mouth daily before breakfast.        Objective:   BP 127/76   Pulse 88   Ht '5\' 2"'  (1.575 m)   Wt 137 lb (62.1 kg)   SpO2 100%   BMI 25.06 kg/m   Wt Readings from Last 3 Encounters:  05/08/20 137 lb (62.1 kg)  03/17/20 137 lb 6.4 oz (62.3 kg)  02/07/20 138 lb (62.6 kg)    Physical Exam Vitals and nursing note reviewed.  Constitutional:      General: She is not in acute distress.    Appearance: She is well-developed and well-nourished. She is not diaphoretic.  Eyes:     Extraocular Movements: EOM normal.     Conjunctiva/sclera: Conjunctivae normal.  Cardiovascular:     Rate and Rhythm: Normal rate and regular  rhythm.     Pulses: Intact distal pulses.     Heart sounds: Normal heart sounds. No murmur heard.   Pulmonary:     Effort: Pulmonary effort is normal. No respiratory distress.     Breath sounds: Normal breath sounds. No wheezing.  Musculoskeletal:        General: No tenderness or edema. Normal range of motion.  Skin:    General: Skin is warm and dry.     Findings: No rash.  Neurological:     Mental Status: She is alert and oriented to person, place, and time.     Coordination: Coordination normal.  Psychiatric:        Mood and Affect: Mood and affect normal.         Behavior: Behavior normal.       Assessment & Plan:   Problem List Items Addressed This Visit      Digestive   GERD (gastroesophageal reflux disease)   Relevant Orders   CMP14+EGFR (Completed)   Ambulatory referral to Gastroenterology     Endocrine   Hypothyroidism due to Hashimoto's thyroiditis   Relevant Medications   levothyroxine (SYNTHROID) 50 MCG tablet   Other Relevant Orders   TSH (Completed)     Other   Hyperlipidemia - Primary   Relevant Orders   Lipid panel (Completed)    Other Visit Diagnoses    Exposure to hepatitis B       Relevant Orders   Hepatitis panel, acute (Completed)    Will recheck thyroid levels and blood work.  We will see how she does with these,  Because of some continued issues with GERD she would like to go see a gastroenterologist.  She has never seen one before.  Follow up plan: Return in about 3 months (around 08/06/2020), or if symptoms worsen or fail to improve, for Thyroid recheck.  Counseling provided for all of the vaccine components Orders Placed This Encounter  Procedures  . CMP14+EGFR  . Lipid panel  . TSH  . Hepatitis panel, acute  . Ambulatory referral to Gastroenterology    Caryl Pina, MD [facility] 05/20/2020, 9:34 PM

## 2020-05-09 LAB — CMP14+EGFR
ALT: 16 IU/L (ref 0–32)
AST: 16 IU/L (ref 0–40)
Albumin/Globulin Ratio: 1.4 (ref 1.2–2.2)
Albumin: 4.8 g/dL (ref 3.9–5.0)
Alkaline Phosphatase: 106 IU/L (ref 44–121)
BUN/Creatinine Ratio: 20 (ref 9–23)
BUN: 15 mg/dL (ref 6–20)
Bilirubin Total: <0.2 mg/dL (ref 0.0–1.2)
CO2: 23 mmol/L (ref 20–29)
Calcium: 9.5 mg/dL (ref 8.7–10.2)
Chloride: 101 mmol/L (ref 96–106)
Creatinine, Ser: 0.75 mg/dL (ref 0.57–1.00)
GFR calc Af Amer: 124 mL/min/{1.73_m2} (ref >59)
GFR calc non Af Amer: 107 mL/min/{1.73_m2} (ref >59)
Globulin, Total: 3.4 g/dL (ref 1.5–4.5)
Glucose: 105 mg/dL — ABNORMAL HIGH (ref 65–99)
Potassium: 4.4 mmol/L (ref 3.5–5.2)
Sodium: 137 mmol/L (ref 134–144)
Total Protein: 8.2 g/dL (ref 6.0–8.5)

## 2020-05-09 LAB — LIPID PANEL
Chol/HDL Ratio: 4.6 ratio — ABNORMAL HIGH (ref 0.0–4.4)
Cholesterol, Total: 188 mg/dL (ref 100–199)
HDL: 41 mg/dL (ref >39)
LDL Chol Calc (NIH): 131 mg/dL — ABNORMAL HIGH (ref 0–99)
Triglycerides: 87 mg/dL (ref 0–149)
VLDL Cholesterol Cal: 16 mg/dL (ref 5–40)

## 2020-05-09 LAB — TSH: TSH: 2.5 u[IU]/mL (ref 0.450–4.500)

## 2020-05-09 LAB — HEPATITIS PANEL, ACUTE
Hep A IgM: NEGATIVE
Hep B C IgM: NEGATIVE
Hep C Virus Ab: 0.1 s/co ratio (ref 0.0–0.9)
Hepatitis B Surface Ag: NEGATIVE

## 2020-05-24 DIAGNOSIS — Z6824 Body mass index (BMI) 24.0-24.9, adult: Secondary | ICD-10-CM | POA: Diagnosis not present

## 2020-05-24 DIAGNOSIS — R519 Headache, unspecified: Secondary | ICD-10-CM | POA: Diagnosis not present

## 2020-05-24 DIAGNOSIS — J029 Acute pharyngitis, unspecified: Secondary | ICD-10-CM | POA: Diagnosis not present

## 2020-05-24 DIAGNOSIS — R6883 Chills (without fever): Secondary | ICD-10-CM | POA: Diagnosis not present

## 2020-05-24 DIAGNOSIS — J101 Influenza due to other identified influenza virus with other respiratory manifestations: Secondary | ICD-10-CM | POA: Diagnosis not present

## 2020-05-24 DIAGNOSIS — R059 Cough, unspecified: Secondary | ICD-10-CM | POA: Diagnosis not present

## 2020-05-24 DIAGNOSIS — R509 Fever, unspecified: Secondary | ICD-10-CM | POA: Diagnosis not present

## 2020-05-24 DIAGNOSIS — R110 Nausea: Secondary | ICD-10-CM | POA: Diagnosis not present

## 2020-06-03 ENCOUNTER — Other Ambulatory Visit: Payer: Medicaid Other

## 2020-06-22 ENCOUNTER — Ambulatory Visit: Payer: Medicaid Other | Admitting: Nurse Practitioner

## 2020-07-01 NOTE — Progress Notes (Deleted)
07/01/2020 Catherine Haney [id number] 11-14-1990   CHIEF COMPLAINT: GERD  HISTORY OF PRESENT ILLNESS: Catherine Haney is a 30 year old female with a past medical history significant for positive COVID-19 05/2020 associated flulike symptoms, cough and sore throat.  She presents to our office today as referred by Dr. Louanne Skye for further evaluation regarding GERD.  CBC Latest Ref Rng & Units 02/21/2020 01/29/2020 08/15/2019  WBC 3.4 - 10.8 x10E3/uL 6.2 5.8 5.9  Hemoglobin 11.1 - 15.9 g/dL 51.7 00.1 74.9  Hematocrit 34.0 - 46.6 % 38.1 40.6 38.7  Platelets 150 - 450 x10E3/uL 341 358 359    CMP Latest Ref Rng & Units 05/08/2020 08/15/2019 10/30/2018  Glucose 65 - 99 mg/dL 449(Q) 90 94  BUN 6 - 20 mg/dL 15 16 9   Creatinine 0.57 - 1.00 mg/dL 7.59 1.63)  Sodium 134 - 144 mmol/L 137 136 137  Potassium 3.5 - 5.2 mmol/L 4.4 4.5 3.8  Chloride 96 - 106 mmol/L 101 102 106  CO2 20 - 29 mmol/L 23 21 21(L)  Calcium 8.7 - 10.2 mg/dL 9.5 8.8 8.46(K)  Total Protein 6.0 - 8.5 g/dL 8.2 7.9 -  Total Bilirubin 0.0 - 1.2 mg/dL 5.9(D <3.5 -  Alkaline Phos 44 - 121 IU/L 106 93 -  AST 0 - 40 IU/L 16 25 -  ALT 0 - 32 IU/L 16 20 -     Past Medical History:  Diagnosis Date  . Anemia affecting third pregnancy 10/18/2018   HGB 11.7 --> 9.5 ->9.3 ->Fe++-> 10.1  . GERD (gastroesophageal reflux disease)   . PROM (premature rupture of membranes) 01/13/2019  . Supervision of normal pregnancy 07/10/2018     FAMILY TREE  LAB RESULTS Language Arabic Pap 10/19 per pt [place] PCP Initiated care at 13wk GC/CT Initial: -/-           36wks: Dating by LMP c/w 10wk U/S   Support person [facility] NT/IT:declined 11/19      MaterniT21:   [place]/HgbE neg Flu vaccine Declined 2/18 CF declined TDaP vaccine declined SMA  Rhogam n/a     Blood Type O/Positive/-- (02/18 1044) Anatomy 11-13-1976 Normal female Antibody N   Past Surgical History:  Procedure Laterality Date  . DILATION AND CURETTAGE OF UTERUS N/A 08/25/2017   Procedure:  SUCTION DILATATION AND CURETTAGE;  Surgeon: 10/25/2017, MD;  Location: [facility];  Service: Gynecology;  Laterality: N/A;   Social History: Non-smoker.  No alcohol use.  No drug use.  Family History: Mother with history of hypertension diabetes.  Father with history of diabetes.  Paternal grandfather with hypertension.   reports that she has never smoked. She has never used smokeless tobacco. She reports that she does not drink alcohol and does not use drugs. family history includes Diabetes in her father and mother; Hypertension in her mother and paternal grandfather. No Known Allergies    Outpatient Encounter Medications as of 07/02/2020  Medication Sig  . levonorgestrel (MIRENA) 20 MCG/24HR IUD 1 each by Intrauterine route once.  08/30/2020 levothyroxine (SYNTHROID) 50 MCG tablet Take 1 tablet (50 mcg total) by mouth daily before breakfast.   No facility-administered encounter medications on file as of 07/02/2020.    REVIEW OF SYSTEMS:  Gen: Denies fever, sweats or chills. No weight loss.  CV: Denies chest pain, palpitations or edema. Resp: Denies cough, shortness of breath of hemoptysis.  GI: Denies heartburn, dysphagia, stomach or lower abdominal pain. No diarrhea or constipation.  GU : Denies urinary burning, blood in  urine, increased urinary frequency or incontinence. MS: Denies joint pain, muscles aches or weakness. Derm: Denies rash, itchiness, skin lesions or unhealing ulcers. Psych: Denies depression, anxiety, memory loss, suicidal ideation and confusion. Heme: Denies bruising, bleeding. Neuro:  Denies headaches, dizziness or paresthesias. Endo:  Denies any problems with DM, thyroid or adrenal function.   PHYSICAL EXAM: There were no vitals taken for this visit. General: Well developed ... in no acute distress. Head: Normocephalic and atraumatic. Eyes:  Sclerae non-icteric, conjunctive pink. Ears: Normal auditory acuity. Mouth: Dentition intact. No ulcers or lesions.  Neck:  Supple, no lymphadenopathy or thyromegaly.  Lungs: Clear bilaterally to auscultation without wheezes, crackles or rhonchi. Heart: Regular rate and rhythm. No murmur, rub or gallop appreciated.  Abdomen: Soft, nontender, non distended. No masses. No hepatosplenomegaly. Normoactive bowel sounds x 4 quadrants.  Rectal:  Musculoskeletal: Symmetrical with no gross deformities. Skin: Warm and dry. No rash or lesions on visible extremities. Extremities: No edema. Neurological: Alert oriented x 4, no focal deficits.  Psychological:  Alert and cooperative. Normal mood and affect.  ASSESSMENT AND PLAN:  79.  30 year old female with GERD symptoms. -GERD handout    CC:  Dettinger, Elige Radon, MD

## 2020-07-02 ENCOUNTER — Ambulatory Visit: Payer: Medicaid Other | Admitting: Nurse Practitioner

## 2020-07-02 DIAGNOSIS — Z20828 Contact with and (suspected) exposure to other viral communicable diseases: Secondary | ICD-10-CM | POA: Diagnosis not present

## 2020-07-02 DIAGNOSIS — R1012 Left upper quadrant pain: Secondary | ICD-10-CM | POA: Diagnosis not present

## 2020-07-02 DIAGNOSIS — J028 Acute pharyngitis due to other specified organisms: Secondary | ICD-10-CM | POA: Diagnosis not present

## 2020-07-02 DIAGNOSIS — R110 Nausea: Secondary | ICD-10-CM | POA: Diagnosis not present

## 2020-07-02 DIAGNOSIS — J029 Acute pharyngitis, unspecified: Secondary | ICD-10-CM | POA: Diagnosis not present

## 2020-07-02 DIAGNOSIS — R197 Diarrhea, unspecified: Secondary | ICD-10-CM | POA: Diagnosis not present

## 2020-07-03 DIAGNOSIS — K219 Gastro-esophageal reflux disease without esophagitis: Secondary | ICD-10-CM | POA: Diagnosis not present

## 2020-07-03 DIAGNOSIS — E063 Autoimmune thyroiditis: Secondary | ICD-10-CM | POA: Diagnosis not present

## 2020-07-03 DIAGNOSIS — Z8639 Personal history of other endocrine, nutritional and metabolic disease: Secondary | ICD-10-CM | POA: Diagnosis not present

## 2020-07-03 DIAGNOSIS — E785 Hyperlipidemia, unspecified: Secondary | ICD-10-CM | POA: Diagnosis not present

## 2020-07-03 DIAGNOSIS — R109 Unspecified abdominal pain: Secondary | ICD-10-CM | POA: Diagnosis not present

## 2020-07-03 DIAGNOSIS — E038 Other specified hypothyroidism: Secondary | ICD-10-CM | POA: Diagnosis not present

## 2020-07-03 DIAGNOSIS — R1013 Epigastric pain: Secondary | ICD-10-CM | POA: Diagnosis not present

## 2020-07-03 DIAGNOSIS — Z8619 Personal history of other infectious and parasitic diseases: Secondary | ICD-10-CM | POA: Diagnosis not present

## 2020-07-07 DIAGNOSIS — E038 Other specified hypothyroidism: Secondary | ICD-10-CM | POA: Diagnosis not present

## 2020-07-07 DIAGNOSIS — R3129 Other microscopic hematuria: Secondary | ICD-10-CM | POA: Diagnosis not present

## 2020-07-07 DIAGNOSIS — E063 Autoimmune thyroiditis: Secondary | ICD-10-CM | POA: Diagnosis not present

## 2020-07-07 DIAGNOSIS — E559 Vitamin D deficiency, unspecified: Secondary | ICD-10-CM | POA: Diagnosis not present

## 2020-07-07 DIAGNOSIS — R1084 Generalized abdominal pain: Secondary | ICD-10-CM | POA: Diagnosis not present

## 2020-07-16 ENCOUNTER — Ambulatory Visit: Payer: Medicaid Other | Admitting: Gastroenterology

## 2020-07-21 ENCOUNTER — Ambulatory Visit: Payer: Medicaid Other | Admitting: Nurse Practitioner

## 2020-07-29 DIAGNOSIS — R3129 Other microscopic hematuria: Secondary | ICD-10-CM | POA: Diagnosis not present

## 2020-08-10 ENCOUNTER — Ambulatory Visit: Payer: Medicaid Other | Admitting: Family Medicine

## 2020-08-14 DIAGNOSIS — R1010 Upper abdominal pain, unspecified: Secondary | ICD-10-CM | POA: Diagnosis not present

## 2020-08-14 DIAGNOSIS — R110 Nausea: Secondary | ICD-10-CM | POA: Diagnosis not present

## 2020-08-14 DIAGNOSIS — R12 Heartburn: Secondary | ICD-10-CM | POA: Diagnosis not present

## 2020-09-23 DIAGNOSIS — R509 Fever, unspecified: Secondary | ICD-10-CM | POA: Diagnosis not present

## 2020-09-23 DIAGNOSIS — J019 Acute sinusitis, unspecified: Secondary | ICD-10-CM | POA: Diagnosis not present

## 2020-09-23 DIAGNOSIS — B9689 Other specified bacterial agents as the cause of diseases classified elsewhere: Secondary | ICD-10-CM | POA: Diagnosis not present

## 2020-09-23 DIAGNOSIS — R0981 Nasal congestion: Secondary | ICD-10-CM | POA: Diagnosis not present

## 2020-10-28 DIAGNOSIS — R3129 Other microscopic hematuria: Secondary | ICD-10-CM | POA: Diagnosis not present

## 2020-12-04 DIAGNOSIS — Z20828 Contact with and (suspected) exposure to other viral communicable diseases: Secondary | ICD-10-CM | POA: Diagnosis not present

## 2020-12-23 ENCOUNTER — Encounter: Payer: Self-pay | Admitting: Women's Health

## 2020-12-23 ENCOUNTER — Other Ambulatory Visit (HOSPITAL_COMMUNITY)
Admission: RE | Admit: 2020-12-23 | Discharge: 2020-12-23 | Disposition: A | Discharge status: Home / Self Care | Payer: Medicaid Other | Source: Ambulatory Visit | Attending: Obstetrics & Gynecology | Admitting: Obstetrics & Gynecology

## 2020-12-23 ENCOUNTER — Other Ambulatory Visit: Payer: Self-pay

## 2020-12-23 ENCOUNTER — Ambulatory Visit (INDEPENDENT_AMBULATORY_CARE_PROVIDER_SITE_OTHER): Payer: Medicaid Other | Admitting: Women's Health

## 2020-12-23 VITALS — BP 128/77 | HR 87 | Ht 60.0 in | Wt 138.0 lb

## 2020-12-23 DIAGNOSIS — R102 Pelvic and perineal pain: Secondary | ICD-10-CM

## 2020-12-23 DIAGNOSIS — R1030 Lower abdominal pain, unspecified: Secondary | ICD-10-CM | POA: Insufficient documentation

## 2020-12-23 LAB — POCT URINALYSIS DIPSTICK OB
Blood, UA: NEGATIVE
Glucose, UA: NEGATIVE
Ketones, UA: NEGATIVE
Leukocytes, UA: NEGATIVE
Nitrite, UA: NEGATIVE
POC,PROTEIN,UA: NEGATIVE

## 2020-12-23 NOTE — Progress Notes (Signed)
GYN VISIT Patient name: Gabriana Wilmott MRN [id number]  Date of birth: 1991-03-13 Chief Complaint:   IUD check (Period x 1 day)  History of Present Illness:   Dalinda Heidt is a 30 y.o. [phone] female being seen today for report of intermittent lower abdominal/back pain prior to and during period, some pain w/ sex. Denies abnormal discharge, itching/odor/irritation.  Denies UTI sx, but wants urine checked to make sure.  Patient's last menstrual period was 12/14/2020. The current method of family planning is IUD. Liletta IUD inserted 02/28/19 Last pap 02/07/20. Results were: NILM w/ HRHPV negative  Depression screen [facility] 2/9 05/08/2020 02/07/2020 01/29/2020 11/27/2019 12/05/2018  Decreased Interest 0 0 0 0 0  Down, Depressed, Hopeless 0 0 0 0 0  PHQ - 2 Score 0 0 0 0 0  Altered sleeping - 0 - - -  Tired, decreased energy - 0 - - -  Change in appetite - 0 - - -  Feeling bad or failure about yourself  - 0 - - -  Trouble concentrating - 0 - - -  Moving slowly or fidgety/restless - 0 - - -  Suicidal thoughts - 0 - - -  PHQ-9 Score - 0 - - -  Difficult doing work/chores - - - - -  Some recent data might be hidden     GAD 7 : Generalized Anxiety Score 02/07/2020  Nervous, Anxious, on Edge 0  Control/stop worrying 0  Worry too much - different things 0  Trouble relaxing 0  Restless 0  Easily annoyed or irritable 0  Afraid - awful might happen 0  Total GAD 7 Score 0     Review of Systems:   Pertinent items are noted in HPI Denies fever/chills, dizziness, headaches, visual disturbances, fatigue, shortness of breath, chest pain, abdominal pain, vomiting, abnormal vaginal discharge/itching/odor/irritation, problems with periods, bowel movements, urination, or intercourse unless otherwise stated above.  Pertinent History Reviewed:  Reviewed past medical,surgical, social, obstetrical and family history.  Reviewed problem list, medications and allergies. Physical Assessment:   Vitals:    12/23/20 0843  BP: 128/77  Pulse: 87  Weight: 138 lb (62.6 kg)  Height: 5' (1.524 m)  Body mass index is 26.95 kg/m.       Physical Examination:   General appearance: alert, well appearing, and in no distress  Mental status: alert, oriented to person, place, and time  Skin: warm & dry   Cardiovascular: normal heart rate noted  Respiratory: normal respiratory effort, no distress  Abdomen: soft, non-tender   Pelvic: VULVA: normal appearing vulva with no masses, tenderness or lesions, VAGINA: normal appearing vagina with normal color and discharge, no lesions, CERVIX: normal appearing cervix without discharge or lesions, IUD strings visible, appropriate length  Extremities: no edema   Informal TA u/s: IUD in place  Chaperone: Latisha Cresenzo    Urine dip: neg  No results found for this or any previous visit (from the past 24 hour(s)).  Assessment & Plan:  1) Lower abd/pelvic pain> CV swab sent, IUD appears to be in correct placement. Will get formal pelvic u/s to assess for other potential causes of pain and f/u after  Meds: No orders of the defined types were placed in this encounter.   Orders Placed This Encounter  Procedures   US PELVIS (TRANSABDOMINAL ONLY)   US PELVIS TRANSVAGINAL NON-OB (TV ONLY)   POC Urinalysis Dipstick OB    Return for 1st available, US:GYN and f/u w/ CNM after.  Cheral Marker  CNM, [facility]NP-BC 12/23/2020 9:43 AM

## 2020-12-24 LAB — CERVICOVAGINAL ANCILLARY ONLY
Bacterial Vaginitis (gardnerella): NEGATIVE
Candida Glabrata: NEGATIVE
Candida Vaginitis: POSITIVE — AB
Chlamydia: NEGATIVE
Comment: NEGATIVE
Comment: NEGATIVE
Comment: NEGATIVE
Comment: NEGATIVE
Comment: NEGATIVE
Comment: NORMAL
Neisseria Gonorrhea: NEGATIVE
Trichomonas: NEGATIVE

## 2020-12-24 MED ORDER — FLUCONAZOLE 150 MG PO TABS: 150.0000 mg | ORAL_TABLET | Freq: Once | ORAL | 0 refills | Status: AC

## 2020-12-24 NOTE — Addendum Note (Signed)
Addended by: Cheral Marker on: 12/24/2020 03:16 PM   Modules accepted: Orders

## 2021-01-18 ENCOUNTER — Ambulatory Visit (INDEPENDENT_AMBULATORY_CARE_PROVIDER_SITE_OTHER): Payer: Medicaid Other

## 2021-01-18 ENCOUNTER — Ambulatory Visit: Payer: Medicaid Other | Admitting: Women's Health

## 2021-01-18 ENCOUNTER — Encounter: Payer: Self-pay | Admitting: Women's Health

## 2021-01-18 ENCOUNTER — Other Ambulatory Visit: Payer: Self-pay

## 2021-01-18 VITALS — Ht 60.0 in | Wt 139.0 lb

## 2021-01-18 DIAGNOSIS — N941 Unspecified dyspareunia: Secondary | ICD-10-CM | POA: Diagnosis not present

## 2021-01-18 DIAGNOSIS — R102 Pelvic and perineal pain: Secondary | ICD-10-CM | POA: Diagnosis not present

## 2021-01-18 DIAGNOSIS — R1030 Lower abdominal pain, unspecified: Secondary | ICD-10-CM | POA: Diagnosis not present

## 2021-01-18 NOTE — Patient Instructions (Signed)
endometriosis

## 2021-01-18 NOTE — Progress Notes (Signed)
PELVIC US TA/TV: homogeneous anteverted uterus,WNL,IUD is centrally located within the endometrium,normal ovaries,ovaries appear mobile,left adnexal pain during ultrasound,no free fluid

## 2021-01-18 NOTE — Progress Notes (Signed)
GYN VISIT Patient name: Catherine Haney MRN [id number]  Date of birth: 09-23-1990 Chief Complaint:   Follow-up ([place] today)  History of Present Illness:   Catherine Haney is a 30 y.o. [phone] female being seen today for f/u after gyn u/s done for report of intermittent low abd/back pain prior to and during period, and dyspareunia. Had candida on CV swab at last visit, did treatment, no improvement.    No LMP recorded. (Menstrual status: IUD). The current method of family planning is IUD.  Last pap 02/07/20. Results were: NILM w/ HRHPV negative  Depression screen [facility] 2/9 05/08/2020 02/07/2020 01/29/2020 11/27/2019 12/05/2018  Decreased Interest 0 0 0 0 0  Down, Depressed, Hopeless 0 0 0 0 0  PHQ - 2 Score 0 0 0 0 0  Altered sleeping - 0 - - -  Tired, decreased energy - 0 - - -  Change in appetite - 0 - - -  Feeling bad or failure about yourself  - 0 - - -  Trouble concentrating - 0 - - -  Moving slowly or fidgety/restless - 0 - - -  Suicidal thoughts - 0 - - -  PHQ-9 Score - 0 - - -  Difficult doing work/chores - - - - -  Some recent data might be hidden     GAD 7 : Generalized Anxiety Score 02/07/2020  Nervous, Anxious, on Edge 0  Control/stop worrying 0  Worry too much - different things 0  Trouble relaxing 0  Restless 0  Easily annoyed or irritable 0  Afraid - awful might happen 0  Total GAD 7 Score 0     Review of Systems:   Pertinent items are noted in HPI Denies fever/chills, dizziness, headaches, visual disturbances, fatigue, shortness of breath, chest pain, abdominal pain, vomiting, abnormal vaginal discharge/itching/odor/irritation, problems with periods, bowel movements, urination, or intercourse unless otherwise stated above.  Pertinent History Reviewed:  Reviewed past medical,surgical, social, obstetrical and family history.  Reviewed problem list, medications and allergies. Physical Assessment:   Vitals:   01/18/21 0947  Weight: 139 lb (63 kg)  Height: 5' (1.524 m)   Body mass index is 27.15 kg/m.       Physical Examination:   General appearance: alert, well appearing, and in no distress  Mental status: alert, oriented to person, place, and time  Skin: warm & dry   Cardiovascular: normal heart rate noted  Respiratory: normal respiratory effort, no distress  Abdomen: soft, non-tender   Pelvic: examination not indicated  Extremities: no edema \\  Chaperone: N/A  Today's pelvic u/s:  Catherine Haney is a 30 y.o. [id number] No LMP recorded. (Menstrual status: IUD). She is here for a pelvic sonogram for pelvic pain.   Uterus                      7.8 x 4.4 x 5.6 cm, Total uterine volume 100 cc,homogeneous anteverted uterus,WNL   Endometrium          7 mm, symmetrical, IUD is centrally located within the endometrium   Right ovary             2.5 x 1.7 x 2.6 cm, wnl   Left ovary                3 x 2 x 3.1 cm, wnl   No free fluid    Technician Comments:   PELVIC US TA/TV: homogeneous anteverted uterus,WNL,IUD is centrally located within the endometrium,EEC 7 mm,normal  ovaries,ovaries appear mobile,left adnexal pain during ultrasound,no free fluid   Chaperoned Murtis Sink 01/18/2021 9:39 AM   No results found for this or any previous visit (from the past 24 hour(s)).  Assessment & Plan:  1) Intermittent pelvic/low back pain, dyspareunia> normal pelvic u/s, IUD in correct placement. Pt wonders if it's b/c she's not having regular periods w/ IUD-discussed how IUD works and likely not the cause. Discussed possibility of endometriosis, offered Dewayne Hatch, wants to try. 200mg  BID samples given (4 boxes/72mth worth), f/u 0m. Pt states if not improved wants to remove Liletta IUD and try Paragard  Meds: No orders of the defined types were placed in this encounter.   No orders of the defined types were placed in this encounter.   Return in about 4 weeks (around 02/15/2021) for med f/u, with me, in person.  02/17/2021 CNM,  [facility] 01/18/2021 10:16 AM

## 2021-02-15 ENCOUNTER — Ambulatory Visit: Payer: Medicaid Other | Admitting: Women's Health

## 2021-03-12 ENCOUNTER — Other Ambulatory Visit: Payer: Self-pay | Admitting: Family Medicine

## 2021-03-18 ENCOUNTER — Other Ambulatory Visit: Payer: Self-pay | Admitting: Family Medicine

## 2021-09-20 ENCOUNTER — Encounter: Payer: Self-pay | Admitting: Obstetrics & Gynecology

## 2021-09-20 ENCOUNTER — Ambulatory Visit: Payer: Medicaid Other | Admitting: Obstetrics & Gynecology

## 2021-09-20 VITALS — BP 129/82 | HR 78 | Ht 62.0 in | Wt 137.8 lb

## 2021-09-20 DIAGNOSIS — Z3009 Encounter for other general counseling and advice on contraception: Secondary | ICD-10-CM | POA: Diagnosis not present

## 2021-09-20 DIAGNOSIS — Z30430 Encounter for insertion of intrauterine contraceptive device: Secondary | ICD-10-CM

## 2021-09-20 DIAGNOSIS — Z3202 Encounter for pregnancy test, result negative: Secondary | ICD-10-CM

## 2021-09-20 LAB — POCT URINE PREGNANCY: Preg Test, Ur: NEGATIVE

## 2021-09-20 MED ORDER — PARAGARD INTRAUTERINE COPPER IU IUD
1.0000 | INTRAUTERINE_SYSTEM | Freq: Once | INTRAUTERINE | Status: AC
  Administered 2021-09-20: 1 via INTRAUTERINE

## 2021-09-20 NOTE — Addendum Note (Signed)
Addended by: Dorita Sciara, Geordie Nooney A on: 09/20/2021 10:31 AM ? ? Modules accepted: Orders ? ?

## 2021-09-20 NOTE — Progress Notes (Signed)
? ?GYN VISIT ?Patient name: Catherine Haney MRN [id number]  Date of birth: Apr 25, 1991 ?Chief Complaint:   ?Contraception (IUD) ? ?History of Present Illness:   ?Catherine Haney is a 31 y.o. [phone]  female being seen today for contraceptive concerns: ? ?Previously had Liletta- she noted significant dysmenorrhea, nervousness and change in her mood.  Liletta removed by her PCP.  Discussed contraceptive options.  She desires Paragard- wants a non-hormonal option.   ?Currently using pull out method.    ? ?Patient's last menstrual period was 07/03/2021 (exact date). ? ? ?  05/08/2020  ? 11:11 AM 02/07/2020  ?  9:50 AM 01/29/2020  ?  1:05 PM 11/27/2019  ?  2:26 PM 12/05/2018  ? 11:03 AM  ?Depression screen PHQ 2/9  ?Decreased Interest 0 0 0 0 0  ?Down, Depressed, Hopeless 0 0 0 0 0  ?PHQ - 2 Score 0 0 0 0 0  ?Altered sleeping  0     ?Tired, decreased energy  0     ?Change in appetite  0     ?Feeling bad or failure about yourself   0     ?Trouble concentrating  0     ?Moving slowly or fidgety/restless  0     ?Suicidal thoughts  0     ?PHQ-9 Score  0     ? ? ? ?Review of Systems:   ?Pertinent items are noted in HPI ?Denies fever/chills, dizziness, headaches, visual disturbances, fatigue, shortness of breath, chest pain, abdominal pain, vomiting, bowel movements, urination, or intercourse unless otherwise stated above.  ?Pertinent History Reviewed:  ?Reviewed past medical,surgical, social, obstetrical and family history.  ?Reviewed problem list, medications and allergies. ?Physical Assessment:  ? ?Vitals:  ? 09/20/21 0929  ?BP: 129/82  ?Pulse: 78  ?Weight: 137 lb 12.8 oz (62.5 kg)  ?Height: 5\' 2"  (1.575 m)  ?Body mass index is 25.2 kg/m?. ? ?     Physical Examination:  ? General appearance: alert, well appearing, and in no distress ? Psych: mood appropriate, normal affect ? Skin: warm & dry  ? Cardiovascular: normal heart rate noted ? Respiratory: normal respiratory effort, no distress ? Abdomen: soft, non-tender  ? Pelvic: normal  external genitalia, vulva, vagina, normal cervix- See IUD insertion ? Extremities: no edema  ? ?Chaperone:   ? ? ?IUD INSERTION ? ?The risks and benefits of the method and placement have been thouroughly reviewed with the patient and all questions were answered.  Specifically the patient is aware of failure rate of 05/998, expulsion of the IUD and of possible perforation.  The patient is aware of irregular bleeding due to the method and understands the incidence of irregular bleeding diminishes with time.  Signed copy of informed consent in chart.  ? ? ?A sterile speculum was placed in the vagina.  The cervix was visualized, prepped using Betadine, and grasped with a single tooth tenaculum. The uterus was sounded to 7 cm.  ?Paragard  IUD placed per manufacturer's recommendations. The strings were trimmed to approximately 3 cm. The patient tolerated the procedure well.  ? ?Informal transvaginal sonogram was performed and the proper placement of the IUD was verified. ? ?Chaperone: Angel Neas   ? ?Assessment & Plan:   ?1) Paragard IUD insertion ?The patient was given post procedure instructions, including signs and symptoms of infection and to check for the strings after each menses or each month, and refraining from intercourse or anything in the vagina for 3 days. ?She was given  a care card with date IUD placed, and date IUD to be removed. ?She is scheduled for a f/u appointment in 6-8 weeks. ? ?Orders Placed This Encounter  ?Procedures  ? POCT urine pregnancy  ? ? ?Return in about 6 weeks (around 11/01/2021) for 6-8wk IUD check up. ? ?Sharon Seller ?

## 2021-11-02 ENCOUNTER — Encounter: Payer: Self-pay | Admitting: Women's Health

## 2021-11-02 ENCOUNTER — Ambulatory Visit (INDEPENDENT_AMBULATORY_CARE_PROVIDER_SITE_OTHER): Payer: Medicaid Other | Admitting: Women's Health

## 2021-11-02 VITALS — BP 136/77 | HR 80 | Wt 141.8 lb

## 2021-11-02 DIAGNOSIS — Z30431 Encounter for routine checking of intrauterine contraceptive device: Secondary | ICD-10-CM

## 2021-11-02 DIAGNOSIS — R42 Dizziness and giddiness: Secondary | ICD-10-CM | POA: Diagnosis not present

## 2021-11-02 LAB — POCT HEMOGLOBIN: Hemoglobin: 11.1 g/dL (ref 11–14.6)

## 2021-11-02 NOTE — Addendum Note (Signed)
Addended by: Moss Mc on: 11/02/2021 12:40 PM   Modules accepted: Orders

## 2021-11-02 NOTE — Progress Notes (Signed)
GYN VISIT Patient name: Catherine Haney MRN [id number]  Date of birth: [birth year] Chief Complaint:   IUD check  History of Present Illness:   Catherine Haney is a 31 y.o. [phone] female being seen today for IUD check. Paragard inserted 09/20/21. Had Liletta previously, and was removed by PCP d/t dysmenorrhea, nervousness and change in mood. States she has noticed improvement in mood/nervousness. Has only had 1wk she hasn't bled since insertion. Denies abnormal discharge, itching/odor/irritation.  Stopped today. Felt dizzy when she was bleeding heavier, wants hgb checked.     No LMP recorded. (Menstrual status: IUD). The current method of family planning is IUD.  Last pap 02/07/20. Results were: NILM w/ HRHPV negative     05/08/2020   11:11 AM 02/07/2020    9:50 AM 01/29/2020    1:05 PM 11/27/2019    2:26 PM 12/05/2018   11:03 AM  Depression screen PHQ 2/9  Decreased Interest 0 0 0 0 0  Down, Depressed, Hopeless 0 0 0 0 0  PHQ - 2 Score 0 0 0 0 0  Altered sleeping  0     Tired, decreased energy  0     Change in appetite  0     Feeling bad or failure about yourself   0     Trouble concentrating  0     Moving slowly or fidgety/restless  0     Suicidal thoughts  0     PHQ-9 Score  0           02/07/2020    9:50 AM  GAD 7 : Generalized Anxiety Score  Nervous, Anxious, on Edge 0  Control/stop worrying 0  Worry too much - different things 0  Trouble relaxing 0  Restless 0  Easily annoyed or irritable 0  Afraid - awful might happen 0  Total GAD 7 Score 0     Review of Systems:   Pertinent items are noted in HPI Denies fever/chills, dizziness, headaches, visual disturbances, fatigue, shortness of breath, chest pain, abdominal pain, vomiting, abnormal vaginal discharge/itching/odor/irritation, problems with periods, bowel movements, urination, or intercourse unless otherwise stated above.  Pertinent History Reviewed:  Reviewed past medical,surgical, social, obstetrical and family  history.  Reviewed problem list, medications and allergies. Physical Assessment:   Vitals:   11/02/21 0927  BP: 136/77  Pulse: 80  Weight: 141 lb 12.8 oz (64.3 kg)  Body mass index is 25.94 kg/m.       Physical Examination:   General appearance: alert, well appearing, and in no distress  Mental status: alert, oriented to person, place, and time  Skin: warm & dry   Cardiovascular: normal heart rate noted  Respiratory: normal respiratory effort, no distress  Abdomen: soft, non-tender   Pelvic: VULVA: normal appearing vulva with no masses, tenderness or lesions, VAGINA: normal appearing vagina with normal color and discharge, no lesions, CERVIX: normal appearing cervix without discharge or lesions, IUD strings visible, appropriate length, small amount mucousy nonodorous menstrual blood in vault  Extremities: no edema   Chaperone: Latisha Cresenzo    Fingerstick hgb: 11.1  No results found for this or any previous visit (from the past 24 hour(s)).  Assessment & Plan:  1) IUD check> in place, prolonged and at times heavier bleeding. Discussed what to expect w/ paragard. Let us know by 7/1 if still bleeding daily, will consider megace  2) Recent lightheadedness during heavier bleeding> fingerstick hgb 11.1  Meds: No orders of the defined types were placed in this  encounter.   No orders of the defined types were placed in this encounter.   Return in about 1 year (around 11/03/2022) for Pap & physical.  Cheral Marker CNM, [facility] 11/02/2021 10:46 AM

## 2021-11-03 ENCOUNTER — Ambulatory Visit: Payer: Medicaid Other | Admitting: Obstetrics & Gynecology

## 2022-07-13 ENCOUNTER — Ambulatory Visit: Payer: Medicaid Other | Admitting: Adult Health

## 2022-07-18 ENCOUNTER — Ambulatory Visit: Payer: Medicaid Other | Admitting: Adult Health

## 2022-07-18 ENCOUNTER — Encounter: Payer: Self-pay | Admitting: Adult Health

## 2022-07-18 VITALS — BP 119/80 | HR 79 | Ht 62.0 in | Wt 146.0 lb

## 2022-07-18 DIAGNOSIS — N921 Excessive and frequent menstruation with irregular cycle: Secondary | ICD-10-CM | POA: Insufficient documentation

## 2022-07-18 DIAGNOSIS — Z3202 Encounter for pregnancy test, result negative: Secondary | ICD-10-CM | POA: Insufficient documentation

## 2022-07-18 DIAGNOSIS — R102 Pelvic and perineal pain: Secondary | ICD-10-CM | POA: Diagnosis not present

## 2022-07-18 DIAGNOSIS — R1031 Right lower quadrant pain: Secondary | ICD-10-CM | POA: Diagnosis not present

## 2022-07-18 DIAGNOSIS — Z30431 Encounter for routine checking of intrauterine contraceptive device: Secondary | ICD-10-CM

## 2022-07-18 LAB — POCT URINE PREGNANCY: Preg Test, Ur: NEGATIVE

## 2022-07-18 MED ORDER — IBUPROFEN 800 MG PO TABS: ORAL_TABLET | ORAL | 1 refills | Status: AC

## 2022-07-18 MED ORDER — MEGESTROL ACETATE 40 MG PO TABS: ORAL_TABLET | ORAL | 1 refills | Status: DC

## 2022-07-18 NOTE — Progress Notes (Signed)
  Subjective:     Patient ID: Catherine Haney, female   DOB: [birth year], 32 y.o.   MRN: [id number]  HPI Catherine Haney is a 32 year old [place] female,married, [phone] in complaining of having bloody discharge, between periods since November and cramping, periods last 7-8 days. She had ParaGard placed 5/1 23, she wants IUD checked. She has interpreter with her.  Last pap was 02/07/20.  PCP is Dr Gita Kudo   Review of Systems +bloody discharge Periods last 7-8 days +cramping No pain with sex Reviewed past medical,surgical, social and family history. Reviewed medications and allergies.     Objective:   Physical Exam BP 119/80 (BP Location: Left Arm, Patient Position: Sitting, Cuff Size: Normal)   Pulse 79   Ht 5' 2"$  (1.575 m)   Wt 146 lb (66.2 kg)   BMI 26.70 kg/m  UPT is negative Skin warm and dry.Pelvic: external genitalia is normal in appearance no lesions, vagina: +blood,no odor,urethra has no lesions or masses noted, cervix bulbous, +IUD strings at os,uterus: normal size, shape and contour, non tender, no masses felt, adnexa: no masses, RLQ tenderness noted. Bladder is non tender and no masses felt. Fall risk is low     Upstream - 07/18/22 1107       Pregnancy Intention Screening   Does the patient want to become pregnant in the next year? Unsure    Does the patient's partner want to become pregnant in the next year? Unsure    Would the patient like to discuss contraceptive options today? No      Contraception Wrap Up   Current Method IUD or IUS    End Method IUD or IUS    Contraception Counseling Provided No            Examination chaperoned by Levy Pupa LPN  Assessment:     1. Pregnancy examination or test, negative result - POCT urine pregnancy  2. Irregular intermenstrual bleeding Will get [place] to assess uterus and ovaries Will rx megace to stop bleeding  Meds ordered this encounter  Medications   megestrol (MEGACE) 40 MG tablet    Sig: Take 3 x 5 days then 2 x 5 days  then 1 daily    Dispense:  45 tablet    Refill:  1    Order Specific Question:   Supervising Provider    Answer:   Tania Ade H [[id number]]   ibuprofen (ADVIL) 800 MG tablet    Sig: Take 1 every 8 hours prn cramps    Dispense:  60 tablet    Refill:  1    Order Specific Question:   Supervising Provider    Answer:   Tania Ade H [[id number]]    - US PELVIC COMPLETE WITH TRANSVAGINAL; Future  3. Pelvic cramping Will get [place]  Will rx motrin 800 mg 1 every 8 hours prn cramps  - US PELVIC COMPLETE WITH TRANSVAGINAL; Future  4. IUD check up Will get [place] - US PELVIC COMPLETE WITH TRANSVAGINAL; Future  5. RLQ abdominal pain Will get Pelvic US in office 07/22/22 - US PELVIC COMPLETE WITH TRANSVAGINAL; Future     Plan:     Return 07/22/22 at 11:30 am for pelvic US and ROS

## 2022-07-22 ENCOUNTER — Ambulatory Visit (INDEPENDENT_AMBULATORY_CARE_PROVIDER_SITE_OTHER): Payer: Medicaid Other

## 2022-07-22 ENCOUNTER — Ambulatory Visit: Payer: Medicaid Other | Admitting: Adult Health

## 2022-07-22 ENCOUNTER — Encounter: Payer: Self-pay | Admitting: Adult Health

## 2022-07-22 VITALS — BP 132/79 | HR 78 | Ht 62.0 in | Wt 146.0 lb

## 2022-07-22 DIAGNOSIS — R1031 Right lower quadrant pain: Secondary | ICD-10-CM | POA: Diagnosis not present

## 2022-07-22 DIAGNOSIS — Z30431 Encounter for routine checking of intrauterine contraceptive device: Secondary | ICD-10-CM

## 2022-07-22 DIAGNOSIS — R102 Pelvic and perineal pain: Secondary | ICD-10-CM

## 2022-07-22 DIAGNOSIS — N921 Excessive and frequent menstruation with irregular cycle: Secondary | ICD-10-CM

## 2022-07-22 DIAGNOSIS — Z975 Presence of (intrauterine) contraceptive device: Secondary | ICD-10-CM

## 2022-07-22 NOTE — Progress Notes (Signed)
  Subjective:     Patient ID: Catherine Haney, female   DOB: 08/01/1990, 32 y.o.   MRN: [id number]  HPI Catherine Haney is a 32 year old [place] female, married, [id number], in for pelvic US to assess irregular bleeding and cramping. Has ParaGard IUD. Interpreter with her.  Last pap was 02/07/20.   PCP is Dr Gita Kudo  Review of Systems Bleeding stopped with megace, but had cramp Reviewed past medical,surgical, social and family history. Reviewed medications and allergies.     Objective:   Physical Exam BP 132/79 (BP Location: Right Arm, Patient Position: Sitting, Cuff Size: Normal)   Pulse 78   Ht '5\' 2"'$  (1.575 m)   Wt 146 lb (66.2 kg)   BMI 26.70 kg/m     Skin warm and dry.  Lungs: clear to ausculation bilaterally. Cardiovascular: regular rate and rhythm.  Reviewed [place] with her:  Normal uterus and ovaries, EEC is 4 mm and IUD is in place, has small amount of fluid within endometrium.   Upstream - 07/22/22 1233       Pregnancy Intention Screening   Does the patient want to become pregnant in the next year? Unsure    Does the patient's partner want to become pregnant in the next year? Unsure    Would the patient like to discuss contraceptive options today? No      Contraception Wrap Up   Current Method IUD or IUS    End Method IUD or IUS    Contraception Counseling Provided No             Assessment:     1. Irregular intermenstrual bleeding Bleeding stopped with megace Can use again if needed, has refill  2. Pelvic cramping Use motrin prn   3. IUD (intrauterine device) in place     Plan:     Follow up prn

## 2022-07-22 NOTE — Progress Notes (Addendum)
PELVIC US TA/TV: homogeneous anteverted uterus,WNL,small amount of fluid within the endometrium,IUD is centrally located within the endometrium,normal ovaries,no free fluid,no pain during ultrasound   Chaperone: Dillard Essex (interpreter)

## 2022-08-18 ENCOUNTER — Ambulatory Visit: Payer: Medicaid Other | Admitting: Adult Health

## 2023-04-26 ENCOUNTER — Ambulatory Visit
Admission: EM | Admit: 2023-04-26 | Discharge: 2023-04-26 | Disposition: A | Discharge status: Home / Self Care | Payer: Medicaid Other | Attending: Internal Medicine | Admitting: Internal Medicine

## 2023-04-26 DIAGNOSIS — H66001 Acute suppurative otitis media without spontaneous rupture of ear drum, right ear: Secondary | ICD-10-CM | POA: Diagnosis not present

## 2023-04-26 MED ORDER — AMOXICILLIN-POT CLAVULANATE 875-125 MG PO TABS: 1.0000 | ORAL_TABLET | Freq: Two times a day (BID) | ORAL | 0 refills | Status: AC

## 2023-04-26 NOTE — Discharge Instructions (Signed)
A prescription was sent for Augmentin. This is an antibiotic used to treat ear infections. Take as directed. Return in 3 to 4 days if no improvement. It is very important for you to pay attention to any new symptoms or worsening of your current condition.  If not allergic, you may use over the counter ibuprofen or acetaminophen as directed for pain.

## 2023-04-26 NOTE — ED Provider Notes (Signed)
[facility]  Note:  This document was prepared using Dragon voice recognition software and may include unintentional dictation errors.  MRN: [id number] DOB: September 04, 1990 DATE: 04/26/23   Subjective:  Chief Complaint:  Chief Complaint  Patient presents with   Otalgia     HPI: Catherine Haney is a 31 y.o. female presenting for right otalgia for the past 2 weeks. Patient became concerned when she noticed a small mount of blood coming from the right ear yesterday. She reports continued pain. She has also had ongoing nasal congestion as well. She has taken OTC analgesics for her pain with relief, but no resolution. Denies fever, nausea/vomiting, cough, sore throat. Endorses right otalgia, congestion. Presents NAD.  Prior to Admission medications   Medication Sig Start Date End Date Taking? Authorizing Provider  ibuprofen (ADVIL) 800 MG tablet Take 1 every 8 hours prn cramps 07/18/22   Cyril Mourning A, NP  levothyroxine (SYNTHROID) 50 MCG tablet Take 1 tablet (50 mcg total) by mouth daily before breakfast. 05/08/20   Dettinger, Elige Radon, MD  PARAGARD INTRAUTERINE COPPER IU by Intrauterine route.    [provider]  Vitamin D, Ergocalciferol, (DRISDOL) 1.25 MG (50000 UNIT) CAPS capsule Take by mouth. 07/18/22   [provider]     No Known Allergies  History:   Past Medical History:  Diagnosis Date   Anemia affecting third pregnancy 10/18/2018   HGB 11.7 --> 9.5 ->9.3 ->Fe++-> 10.1   GERD (gastroesophageal reflux disease)    PROM (premature rupture of membranes) 01/13/2019   Supervision of normal pregnancy 07/10/2018     FAMILY TREE  LAB RESULTS Language Arabic Pap 10/19 per pt [place] PCP Initiated care at 13wk GC/CT Initial: -/-           36wks: Dating by LMP c/w 10wk U/S   Support person [employer] NT/IT:declined WUJ:[id number]      MaterniT21:   [place]/HgbE neg Flu vaccine Declined 2/18 CF declined TDaP vaccine declined SMA  Rhogam n/a     Blood Type O/Positive/--  (02/18 1044) Anatomy US Normal female Antibody N     Past Surgical History:  Procedure Laterality Date   DILATION AND CURETTAGE OF UTERUS N/A 08/25/2017   Procedure: SUCTION DILATATION AND CURETTAGE;  Surgeon: Lazaro Arms, MD;  Location: [facility];  Service: Gynecology;  Laterality: N/A;    Family History  Problem Relation Age of Onset   Hypertension Mother    Diabetes Mother    Diabetes Father    Hypertension Paternal Grandfather     Social History   Tobacco Use   Smoking status: Never   Smokeless tobacco: Never  Vaping Use   Vaping status: Never Used  Substance Use Topics   Alcohol use: Never    Alcohol/week: 0.0 standard drinks of alcohol   Drug use: Never    Review of Systems  Constitutional:  Negative for fever.  HENT:  Positive for congestion, ear discharge and ear pain. Negative for sore throat.   Respiratory:  Negative for cough.   Gastrointestinal:  Negative for abdominal pain, nausea and vomiting.     Objective:   Vitals: BP 118/81 (BP Location: Right Arm)   Pulse 87   Temp 98.8 F (37.1 C) (Oral)   Resp 16   LMP 04/02/2023 (Exact Date)   SpO2 98%   Physical Exam Constitutional:      General: She is not in acute distress.    Appearance: Normal appearance. She is well-developed and normal weight. She is not  ill-appearing or toxic-appearing.  HENT:     Head: Normocephalic and atraumatic.     Right Ear: Ear canal normal. Tenderness present. Tympanic membrane is bulging.     Left Ear: Ear canal normal. A middle ear effusion is present.     Ears:     Comments: Right TM opacified and bulging. TTP on exam. No discharge noted on exam at this time.    Mouth/Throat:     Pharynx: Oropharynx is clear. Uvula midline. No pharyngeal swelling, oropharyngeal exudate or posterior oropharyngeal erythema.     Tonsils: No tonsillar exudate or tonsillar abscesses.  Cardiovascular:     Rate and Rhythm: Normal rate and regular rhythm.     Heart sounds: Normal heart  sounds.  Pulmonary:     Effort: Pulmonary effort is normal.     Breath sounds: Normal breath sounds.     Comments: Clear to auscultation bilaterally  Abdominal:     General: Bowel sounds are normal.     Palpations: Abdomen is soft.     Tenderness: There is no abdominal tenderness.  Skin:    General: Skin is warm and dry.  Neurological:     General: No focal deficit present.     Mental Status: She is alert.  Psychiatric:        Mood and Affect: Mood and affect normal.     Results:  Labs: No results found for this or any previous visit (from the past 24 hour(s)).  Radiology: No results found.   UC Course/Treatments:  Procedures: Procedures   Medications Ordered in UC: Medications - No data to display   Assessment and Plan :     ICD-10-CM   1. Non-recurrent acute suppurative otitis media of right ear without spontaneous rupture of tympanic membrane  H66.001       Nonrecurrent acute suppurative otitis media of right ear without spontaneous rupture of tympanic membrane Afebrile, nontoxic-appearing, NAD. VSS. DDX includes but not limited to: Otitis media, otitis externa, eustachian tube dysfunction On exam right TM was bulging and opacified.  Given ongoing pain and bloody discharge, suspect otitis media.  No rupture of the TM was noted today during exam.  Augmentin 875 mg twice daily was prescribed. Recommend OTC analgesics as needed for pain. Strict ED precautions were given and patient verbalized understanding.  ED Discharge Orders          Ordered    amoxicillin-clavulanate (AUGMENTIN) 875-125 MG tablet  Every 12 hours        04/26/23 1101             PDMP not reviewed this encounter.     Cynda Acres, PA-C 04/26/23 1107

## 2023-04-26 NOTE — ED Triage Notes (Signed)
Ear pain two weeks ago and yesterday with blood in right ear and with pain and cold sxs. Denies: dizziness, F, chills, body aches, sinus pressure.

## 2023-06-27 ENCOUNTER — Other Ambulatory Visit: Payer: Self-pay

## 2023-06-27 ENCOUNTER — Ambulatory Visit
Admission: EM | Admit: 2023-06-27 | Discharge: 2023-06-27 | Disposition: A | Discharge status: Home / Self Care | Payer: Medicaid Other | Attending: Internal Medicine | Admitting: Internal Medicine

## 2023-06-27 DIAGNOSIS — R3129 Other microscopic hematuria: Secondary | ICD-10-CM | POA: Diagnosis present

## 2023-06-27 DIAGNOSIS — R1013 Epigastric pain: Secondary | ICD-10-CM | POA: Diagnosis present

## 2023-06-27 LAB — POCT URINALYSIS DIP (MANUAL ENTRY)
Bilirubin, UA: NEGATIVE
Glucose, UA: NEGATIVE mg/dL
Ketones, POC UA: NEGATIVE mg/dL
Leukocytes, UA: NEGATIVE
Nitrite, UA: NEGATIVE
Protein Ur, POC: NEGATIVE mg/dL
Spec Grav, UA: >=1.03 — AB (ref 1.010–1.025)
Urobilinogen, UA: 0.2 U/dL
pH, UA: 5.5 (ref 5.0–8.0)

## 2023-06-27 LAB — POCT URINE PREGNANCY: Preg Test, Ur: NEGATIVE

## 2023-06-27 MED ORDER — OMEPRAZOLE MAGNESIUM 20 MG PO TBEC: 20.0000 mg | DELAYED_RELEASE_TABLET | Freq: Every day | ORAL | 1 refills | Status: AC

## 2023-06-27 NOTE — Discharge Instructions (Signed)
 Your blood work and urine were sent to the lab for further testing. Someone from our office will call you with your results.  I recommend you avoid dairy products and spicy foods as well as start your Omeprazole  daily. I sent a refill to your pharmacy.  Please noted our office is limited in the tests and imaging we can perform at our facility. Your evaluation was not suggestive of any emergent condition requiring medical intervention at this time. However, some abdominal problems make take more time to appear. Therefore, it is very important for you to pay attention to any new symptoms or worsening of your current condition.   Please go directly to the Emergency Department immediately should you begin to feel worse in any way or have any of the following symptoms: increasing or different abdominal pain, persistent vomiting, inability to drink fluids, fevers, bloody bowel movements, or begin vomiting blood.

## 2023-06-27 NOTE — ED Provider Notes (Signed)
 [facility]  Note:  This document was prepared using Dragon voice recognition software and may include unintentional dictation errors.  MRN: [id number] DOB: 1991-04-14 DATE: 06/27/23   Subjective:  Chief Complaint:  Chief Complaint  Patient presents with   Abdominal Pain     HPI: Catherine Haney is a 33 y.o. female presenting for intermittent epigastric pain. Patient is unsure exactly when her symptoms began. She reports intermittent epigastric and generalized abdominal pain. She states she does have pain currently and is more diffuse. Symptoms worse with dairy products and spicy foods. She takes omeprazole  as needed. No prior abdominal history. No nausea/vomiting or diarrhea. Denies fever, nausea/vomiting, diarrhea, bloody stools, dysuria, hematuria. Endorses epigastric pain. Presents NAD.  Prior to Admission medications   Medication Sig Start Date End Date Taking? Authorizing Provider  omeprazole  (PRILOSEC  OTC) 20 MG tablet Take 20 mg by mouth daily.   Yes [provider]  ibuprofen  (ADVIL ) 800 MG tablet Take 1 every 8 hours prn cramps 07/18/22   Signa Delon LABOR, NP  levothyroxine  (SYNTHROID ) 50 MCG tablet Take 1 tablet (50 mcg total) by mouth daily before breakfast. 05/08/20   Dettinger, Fonda LABOR, MD     No Known Allergies  History:   Past Medical History:  Diagnosis Date   Anemia affecting third pregnancy 10/18/2018   HGB 11.7 --> 9.5 ->9.3 ->Fe++-> 10.1   GERD (gastroesophageal reflux disease)    PROM (premature rupture of membranes) 01/13/2019   Supervision of normal pregnancy 07/10/2018     FAMILY TREE  LAB RESULTS Language Arabic Pap 10/19 per pt [place] PCP Initiated care at 13wk GC/CT Initial: -/-           36wks: Dating by LMP c/w 10wk U/S   Support person [employer] NT/IT:declined JQE:izropwzi      MaterniT21:   [place]/HgbE neg Flu vaccine Declined 2/18 CF declined TDaP vaccine declined SMA  Rhogam n/a     Blood Type O/Positive/-- (02/18 1044) Anatomy  US  Normal female Antibody N     Past Surgical History:  Procedure Laterality Date   DILATION AND CURETTAGE OF UTERUS N/A 08/25/2017   Procedure: SUCTION DILATATION AND CURETTAGE;  Surgeon: Jayne Vonn DEL, MD;  Location: [facility];  Service: Gynecology;  Laterality: N/A;    Family History  Problem Relation Age of Onset   Hypertension Mother    Diabetes Mother    Diabetes Father    Hypertension Paternal Grandfather     Social History   Tobacco Use   Smoking status: Never   Smokeless tobacco: Never  Vaping Use   Vaping status: Never Used  Substance Use Topics   Alcohol use: Never    Alcohol/week: 0.0 standard drinks of alcohol   Drug use: Never    Review of Systems  Constitutional:  Negative for fever.  Gastrointestinal:  Positive for abdominal pain. Negative for blood in stool, diarrhea, nausea and vomiting.  Genitourinary:  Negative for dysuria and hematuria.  Musculoskeletal:  Negative for back pain.     Objective:   Vitals: BP 118/74 (BP Location: Right Arm)   Pulse 82   Temp 98 F (36.7 C) (Oral)   Resp 18   LMP 06/07/2023   SpO2 98%   Physical Exam Constitutional:      General: She is not in acute distress.    Appearance: Normal appearance. She is well-developed and normal weight. She is not ill-appearing or toxic-appearing.  HENT:     Head: Normocephalic and atraumatic.  Cardiovascular:  Rate and Rhythm: Normal rate and regular rhythm.     Heart sounds: Normal heart sounds.  Pulmonary:     Effort: Pulmonary effort is normal.     Breath sounds: Normal breath sounds.     Comments: Clear to auscultation bilaterally  Abdominal:     General: Bowel sounds are normal.     Palpations: Abdomen is soft.     Tenderness: There is generalized abdominal tenderness. There is no right CVA tenderness or left CVA tenderness.     Comments: No acute abdomen   Skin:    General: Skin is warm and dry.  Neurological:     General: No focal deficit present.     Mental  Status: She is alert.  Psychiatric:        Mood and Affect: Mood and affect normal.     Results:  Labs: Results for orders placed or performed during the hospital encounter of 06/27/23 (from the past 24 hours)  POCT urinalysis dipstick     Status: Abnormal   Collection Time: 06/27/23 10:44 AM  Result Value Ref Range   Color, UA yellow yellow   Clarity, UA clear clear   Glucose, UA negative negative mg/dL   Bilirubin, UA negative negative   Ketones, POC UA negative negative mg/dL   Spec Grav, UA >=8.969 (A) 1.010 - 1.025   Blood, UA small (A) negative   pH, UA 5.5 5.0 - 8.0   Protein Ur, POC negative negative mg/dL   Urobilinogen, UA 0.2 0.2 or 1.0 E.U./dL   Nitrite, UA Negative Negative   Leukocytes, UA Negative Negative  POCT urine pregnancy     Status: None   Collection Time: 06/27/23 10:44 AM  Result Value Ref Range   Preg Test, Ur Negative Negative    Radiology: No results found.   UC Course/Treatments:  Procedures: Procedures   Medications Ordered in UC: Medications - No data to display   Assessment and Plan :     ICD-10-CM   1. Epigastric pain  R10.13 Comprehensive metabolic panel    Lipase    CBC with Differential/Platelet    POCT urinalysis dipstick    Comprehensive metabolic panel    Lipase    CBC with Differential/Platelet    POCT urinalysis dipstick    Urine Culture    Urine Culture    2. Other microscopic hematuria  R31.29      Epigastric pain Afebrile, nontoxic-appearing, NAD. VSS. DDX includes but not limited to: gastroenteritis, gastritis, appendicitis, cholecystitis, pancreatitis, GERD, lactose intolerance, cystitis, pyelonephritis  Given symptoms are worse with dairy and spicy foods, suspect poorly controlled GERD. Recommend she start omeprazole  daily instead of PRN. RX refilled. Avoid foods that cause worsening symptoms. Given diffuse tenderness today in office, CBC, CMP, and lipase are pending to evaluate WBC, liver function, and for  pancreatitis. UA was positive for a small amount of blood. Culture is pending. Will adjust treatment plan based on culture results. Low suspicion for urological etiology at this time given no dysuria. UPT was negative. Strict ED precautions were given and patient verbalized understanding.  Other Microscopic Hematuria Afebrile, nontoxic-appearing, NAD. VSS. DDX includes but not limited to: Cystitis, pyelonephritis, nephrolithiasis, malignancy, dehydration, menstruation UA was positive for a small amount of blood. Culture is pending. Will adjust treatment plan based on culture results. Low suspicion for urological etiology at this time given no dysuria. UPT was negative. Strict ED precautions were given and patient verbalized understanding.  ED Discharge Orders  Ordered    omeprazole  (PRILOSEC  OTC) 20 MG tablet  Daily        06/27/23 1051             PDMP not reviewed this encounter.     Chennel Olivos P, PA-C 06/27/23 1058

## 2023-06-27 NOTE — ED Triage Notes (Signed)
C/O intermittent generalized abdominal pain, patient states mostly when she eats cheese or dairy. Denies vomiting or diarrhea. Patient unsure of how long.

## 2023-06-28 LAB — COMPREHENSIVE METABOLIC PANEL
ALT: 9 [IU]/L (ref 0–32)
AST: 13 [IU]/L (ref 0–40)
Albumin: 3.9 g/dL (ref 3.9–4.9)
Alkaline Phosphatase: 65 [IU]/L (ref 44–121)
BUN/Creatinine Ratio: 21 (ref 9–23)
BUN: 11 mg/dL (ref 6–20)
Bilirubin Total: <0.2 mg/dL (ref 0.0–1.2)
CO2: 18 mmol/L — ABNORMAL LOW (ref 20–29)
Calcium: 8.7 mg/dL (ref 8.7–10.2)
Chloride: 106 mmol/L (ref 96–106)
Creatinine, Ser: 0.52 mg/dL — ABNORMAL LOW (ref 0.57–1.00)
Globulin, Total: 3.4 g/dL (ref 1.5–4.5)
Glucose: 83 mg/dL (ref 70–99)
Potassium: 4.3 mmol/L (ref 3.5–5.2)
Sodium: 138 mmol/L (ref 134–144)
Total Protein: 7.3 g/dL (ref 6.0–8.5)
eGFR: 126 mL/min/{1.73_m2} (ref >59)

## 2023-06-28 LAB — URINE CULTURE: Culture: NO GROWTH

## 2023-06-28 LAB — CBC WITH DIFFERENTIAL/PLATELET
Basophils Absolute: 0.1 10*3/uL (ref 0.0–0.2)
Basos: 1 %
EOS (ABSOLUTE): 0.1 10*3/uL (ref 0.0–0.4)
Eos: 2 %
Hematocrit: 36.5 % (ref 34.0–46.6)
Hemoglobin: 11.8 g/dL (ref 11.1–15.9)
Immature Grans (Abs): 0 10*3/uL (ref 0.0–0.1)
Immature Granulocytes: 0 %
Lymphocytes Absolute: 2.2 10*3/uL (ref 0.7–3.1)
Lymphs: 39 %
MCH: 27.1 pg (ref 26.6–33.0)
MCHC: 32.3 g/dL (ref 31.5–35.7)
MCV: 84 fL (ref 79–97)
Monocytes Absolute: 0.4 10*3/uL (ref 0.1–0.9)
Monocytes: 6 %
Neutrophils Absolute: 2.8 10*3/uL (ref 1.4–7.0)
Neutrophils: 52 %
Platelets: 400 10*3/uL (ref 150–450)
RBC: 4.36 x10E6/uL (ref 3.77–5.28)
RDW: 12.8 % (ref 11.7–15.4)
WBC: 5.5 10*3/uL (ref 3.4–10.8)

## 2023-06-28 LAB — LIPASE: Lipase: 43 U/L (ref 14–72)

## 2023-07-18 ENCOUNTER — Ambulatory Visit
Admission: EM | Admit: 2023-07-18 | Discharge: 2023-07-18 | Disposition: A | Discharge status: Home / Self Care | Payer: Medicaid Other | Attending: Emergency Medicine | Admitting: Emergency Medicine

## 2023-07-18 ENCOUNTER — Other Ambulatory Visit: Payer: Self-pay

## 2023-07-18 DIAGNOSIS — R0982 Postnasal drip: Secondary | ICD-10-CM

## 2023-07-18 DIAGNOSIS — J309 Allergic rhinitis, unspecified: Secondary | ICD-10-CM

## 2023-07-18 LAB — POC COVID19/FLU A&B COMBO
Covid Antigen, POC: NEGATIVE
Influenza A Antigen, POC: NEGATIVE
Influenza B Antigen, POC: NEGATIVE

## 2023-07-18 MED ORDER — CETIRIZINE HCL 10 MG PO TABS: 10.0000 mg | ORAL_TABLET | Freq: Every day | ORAL | 0 refills | Status: AC

## 2023-07-18 MED ORDER — FLUTICASONE PROPIONATE 50 MCG/ACT NA SUSP: 1.0000 | Freq: Every day | NASAL | 0 refills | Status: AC

## 2023-07-18 NOTE — ED Provider Notes (Signed)
 [facility]    CSN: [id number] Arrival date & time: 07/18/23  1934    HISTORY   Chief Complaint  Patient presents with   Cough   Fever   HPI Catherine Haney is a pleasant, 33 y.o. female who presents to urgent care today. Pt c/o nonproductive cough, tactile fever, sore throat, headache, back pain, and dried blood in nose since last night. Last ibuprofen was taken at 6pm.  Patient denies nausea, vomiting, diarrhea, known sick contacts.  Patient states all other family members at home have been well with the exception of her 8-year-old son who is with her today to be seen.  The history is provided by the patient.   Past Medical History:  Diagnosis Date   Anemia affecting third pregnancy 10/18/2018   HGB 11.7 --> 9.5 ->9.3 ->Fe++-> 10.1   GERD (gastroesophageal reflux disease)    PROM (premature rupture of membranes) 01/13/2019   Supervision of normal pregnancy 07/10/2018     FAMILY TREE  LAB RESULTS Language Arabic Pap 10/19 per pt [place] PCP Initiated care at 13wk GC/CT Initial: -/-           36wks: Dating by LMP c/w 10wk U/S   Support person [employer] NT/IT:declined [id number]      MaterniT21:   [place]/HgbE neg Flu vaccine Declined 2/18 CF declined TDaP vaccine declined SMA  Rhogam n/a     Blood Type O/Positive/-- (02/18 1044) Anatomy US Normal female Antibody N   Patient Active Problem List   Diagnosis Date Noted   RLQ abdominal pain 07/18/2022   IUD check up 07/18/2022   Pelvic cramping 07/18/2022   Irregular intermenstrual bleeding 07/18/2022   Pregnancy examination or test, negative result 07/18/2022   Hypothyroidism due to Hashimoto's thyroiditis 12/12/2019   Dyspareunia, female 10/10/2019   Prolonged periods 10/10/2019   Chronic fatigue and malaise 08/14/2019   Encounter for IUD insertion 02/28/2019   Atypical chest pain 10/17/2017   IUD (intrauterine device) in place 01/19/2017   Hyperlipidemia 08/27/2015   GERD (gastroesophageal reflux disease) 08/20/2015    Generalized anxiety disorder 07/24/2015   Other seasonal allergic rhinitis 03/18/2015   Chronic pain of multiple joints 03/18/2015   Past Surgical History:  Procedure Laterality Date   DILATION AND CURETTAGE OF UTERUS N/A 08/25/2017   Procedure: SUCTION DILATATION AND CURETTAGE;  Surgeon: Lazaro Arms, MD;  Location: [facility];  Service: Gynecology;  Laterality: N/A;   OB History     Gravida  4   Para  3   Term  3   Preterm      AB  1   Living  3      SAB  1   IAB      Ectopic      Multiple  0   Live Births  3          Home Medications    Prior to Admission medications   Medication Sig Start Date End Date Taking? Authorizing Provider  ibuprofen (ADVIL) 800 MG tablet Take 1 every 8 hours prn cramps 07/18/22   Cyril Mourning A, NP  levothyroxine (SYNTHROID) 50 MCG tablet Take 1 tablet (50 mcg total) by mouth daily before breakfast. 05/08/20   Dettinger, Elige Radon, MD  omeprazole (PRILOSEC OTC) 20 MG tablet Take 1 tablet (20 mg total) by mouth daily. 06/27/23   Hermanns, Ashlee P, PA-C    Family History Family History  Problem Relation Age of Onset   Hypertension Mother    Diabetes  Mother    Diabetes Father    Hypertension Paternal Grandfather    Social History Social History   Tobacco Use   Smoking status: Never   Smokeless tobacco: Never  Vaping Use   Vaping status: Never Used  Substance Use Topics   Alcohol use: Never    Alcohol/week: 0.0 standard drinks of alcohol   Drug use: Never   Allergies   Patient has no known allergies.  Review of Systems Review of Systems Pertinent findings revealed after performing a 14 point review of systems has been noted in the history of present illness.  Physical Exam Vital Signs BP 109/77 (BP Location: Right Arm)   Pulse (!) 107   Temp 98.1 F (36.7 C) (Oral)   Resp 18   Wt 139 lb 12.8 oz (63.4 kg)   LMP 07/04/2023   SpO2 98%   BMI 25.57 kg/m   No data found.  Physical Exam Vitals and nursing  note reviewed.  Constitutional:      General: She is not in acute distress.    Appearance: Normal appearance. She is not ill-appearing.  HENT:     Head: Normocephalic and atraumatic.     Salivary Glands: Right salivary gland is not diffusely enlarged or tender. Left salivary gland is not diffusely enlarged or tender.     Right Ear: Tympanic membrane, ear canal and external ear normal. No drainage. No middle ear effusion. There is no impacted cerumen. Tympanic membrane is not erythematous or bulging.     Left Ear: Tympanic membrane, ear canal and external ear normal. No drainage.  No middle ear effusion. There is no impacted cerumen. Tympanic membrane is not erythematous or bulging.     Nose: Nose normal. No nasal deformity, septal deviation, mucosal edema, congestion or rhinorrhea.     Right Turbinates: Not enlarged, swollen or pale.     Left Turbinates: Not enlarged, swollen or pale.     Right Sinus: No maxillary sinus tenderness or frontal sinus tenderness.     Left Sinus: No maxillary sinus tenderness or frontal sinus tenderness.     Mouth/Throat:     Lips: Pink. No lesions.     Mouth: Mucous membranes are moist. No oral lesions.     Pharynx: Oropharynx is clear. Uvula midline. No posterior oropharyngeal erythema or uvula swelling.     Tonsils: No tonsillar exudate. 0 on the right. 0 on the left.  Eyes:     General: Lids are normal.        Right eye: No discharge.        Left eye: No discharge.     Extraocular Movements: Extraocular movements intact.     Conjunctiva/sclera: Conjunctivae normal.     Right eye: Right conjunctiva is not injected.     Left eye: Left conjunctiva is not injected.  Neck:     Trachea: Trachea and phonation normal.  Cardiovascular:     Rate and Rhythm: Regular rhythm. Tachycardia present.     Pulses: Normal pulses.     Heart sounds: Normal heart sounds, S1 normal and S2 normal. No murmur heard.    No friction rub. No gallop.  Pulmonary:     Effort:  Pulmonary effort is normal. No tachypnea, bradypnea, accessory muscle usage, prolonged expiration or respiratory distress.     Breath sounds: Normal breath sounds and air entry. No stridor, decreased air movement or transmitted upper airway sounds. No decreased breath sounds, wheezing, rhonchi or rales.  Chest:     Chest  wall: No tenderness.  Musculoskeletal:        General: Normal range of motion.     Cervical back: Full passive range of motion without pain, normal range of motion and neck supple. Normal range of motion.  Lymphadenopathy:     Cervical: No cervical adenopathy.  Skin:    General: Skin is warm and dry.     Findings: No erythema or rash.  Neurological:     General: No focal deficit present.     Mental Status: She is alert and oriented to person, place, and time.  Psychiatric:        Mood and Affect: Mood normal.        Behavior: Behavior normal.     Visual Acuity Right Eye Distance:   Left Eye Distance:   Bilateral Distance:    Right Eye Near:   Left Eye Near:    Bilateral Near:     UC Couse / Diagnostics / Procedures:     Radiology No results found.  Procedures Procedures (including critical care time) EKG  Pending results:  Labs Reviewed  POC COVID19/FLU A&B COMBO    Medications Ordered in UC: Medications - No data to display  UC Diagnoses / Final Clinical Impressions(s)   I have reviewed the triage vital signs and the nursing notes.  Pertinent labs & imaging results that were available during my care of the patient were reviewed by me and considered in my medical decision making (see chart for details).    Final diagnoses:  Allergic rhinitis with postnasal drip   Physical exam findings concerning for uncontrolled allergic rhinitis, postnasal drip.  All viral testing was negative.  Recommend allergy medications for treatment.  Return precautions advised.    Please see discharge instructions below for details of plan of care as provided to  patient. ED Prescriptions     Medication Sig Dispense Auth. Provider   fluticasone (FLONASE) 50 MCG/ACT nasal spray Place 1 spray into both nostrils daily. Begin by using 2 sprays in each nare daily for 3 to 5 days, then decrease to 1 spray in each nare daily. 15.8 mL Theadora Rama Scales, PA-C   cetirizine (ZYRTEC ALLERGY) 10 MG tablet Take 1 tablet (10 mg total) by mouth at bedtime. 30 tablet Theadora Rama Scales, PA-C      PDMP not reviewed this encounter.  Pending results:  Labs Reviewed  POC COVID19/FLU A&B COMBO      Discharge Instructions      Your rapid influenza antigen test today was negative.  No further influenza testing is indicated.     Your COVID-19 test is negative.  Please consider retesting in the next 2 to 3 days, particularly if you are not feeling any better.  You are welcome to return here to urgent care to have it done or you can take a home COVID-19 test.     If both your COVID-19 tests are negative, then you can safely assume that your illness is due to one of the many less serious illnesses circulating in our community right now.     Conservative care is recommended with rest, drinking plenty of clear fluids, eating only when hungry, taking supportive medications for your symptoms and avoiding being around other people.  Please remain at home until you are fever free for 24 hours without the use of antifever medications such as Tylenol and ibuprofen.   Please read below to learn more about the medications, dosages and frequencies that I recommend to help  alleviate your symptoms and to get you feeling better soon:   Zyrtec (cetirizine): This is an excellent second-generation antihistamine that helps to reduce respiratory inflammatory response to environmental allergens.  In some patients, this medication can cause daytime sleepiness so I recommend that you take 1 tablet daily at bedtime.     Flonase (fluticasone): This is a steroid nasal spray that used  once daily, 1 spray in each nare.  This works best when used on a daily basis. This medication does not work well if it is only used when you think you need it.  After 3 to 5 days of use, you will notice significant reduction of the inflammation and mucus production that is currently being caused by exposure to allergens, whether seasonal or environmental.  The most common side effect of this medication is nosebleeds.  If you experience a nosebleed, please discontinue use for 1 week, then feel free to resume.  If you find that your insurance will not pay for this medication, please consider a different nasal steroids such as Nasonex (mometasone), or Nasacort (triamcinolone).   Advil, Motrin (ibuprofen): This is a good anti-inflammatory medication which addresses aches, pains and inflammation of the upper airways that causes sinus and nasal congestion as well as in the lower airways which makes your cough feel tight and sometimes burn.  I recommend that you take between 400 to 600 mg every 6-8 hours as needed.  Please do not take more than 2400 mg of ibuprofen in a 24-hour period and please do not take high doses of ibuprofen for more than 3 days in a row as this can lead to stomach ulcers.   If symptoms have not meaningfully improved in the next 5 to 7 days, please return for repeat evaluation or follow-up with your regular provider.  If symptoms have worsened in the next 3 to 5 days, please go to the emergency room for further evaluation.    Thank you for visiting urgent care today.  We appreciate the opportunity to participate in your care.       Disposition Upon Discharge:  Condition: stable for discharge home  Patient presented with an acute illness with associated systemic symptoms and significant discomfort requiring urgent management. In my opinion, this is a condition that a prudent lay person (someone who possesses an average knowledge of health and medicine) may potentially expect to result  in complications if not addressed urgently such as respiratory distress, impairment of bodily function or dysfunction of bodily organs.   Routine symptom specific, illness specific and/or disease specific instructions were discussed with the patient and/or caregiver at length.   As such, the patient has been evaluated and assessed, work-up was performed and treatment was provided in alignment with urgent care protocols and evidence based medicine.  Patient/parent/caregiver has been advised that the patient may require follow up for further testing and treatment if the symptoms continue in spite of treatment, as clinically indicated and appropriate.  Patient/parent/caregiver has been advised to return to the [facility] or PCP if no better; to PCP or the Emergency Department if new signs and symptoms develop, or if the current signs or symptoms continue to change or worsen for further workup, evaluation and treatment as clinically indicated and appropriate  The patient will follow up with their current PCP if and as advised. If the patient does not currently have a PCP we will assist them in obtaining one.   The patient may need specialty follow up if the symptoms  continue, in spite of conservative treatment and management, for further workup, evaluation, consultation and treatment as clinically indicated and appropriate.  Patient/parent/caregiver verbalized understanding and agreement of plan as discussed.  All questions were addressed during visit.  Please see discharge instructions below for further details of plan.  This office note has been dictated using Teaching laboratory technician.  Unfortunately, this method of dictation can sometimes lead to typographical or grammatical errors.  I apologize for your inconvenience in advance if this occurs.  Please do not hesitate to reach out to me if clarification is needed.      Theadora Rama Scales, PA-C 07/18/23 2014

## 2023-07-18 NOTE — Discharge Instructions (Signed)
 Your rapid influenza antigen test today was negative.  No further influenza testing is indicated.     Your COVID-19 test is negative.  Please consider retesting in the next 2 to 3 days, particularly if you are not feeling any better.  You are welcome to return here to urgent care to have it done or you can take a home COVID-19 test.     If both your COVID-19 tests are negative, then you can safely assume that your illness is due to one of the many less serious illnesses circulating in our community right now.     Conservative care is recommended with rest, drinking plenty of clear fluids, eating only when hungry, taking supportive medications for your symptoms and avoiding being around other people.  Please remain at home until you are fever free for 24 hours without the use of antifever medications such as Tylenol and ibuprofen.   Please read below to learn more about the medications, dosages and frequencies that I recommend to help alleviate your symptoms and to get you feeling better soon:   Zyrtec (cetirizine): This is an excellent second-generation antihistamine that helps to reduce respiratory inflammatory response to environmental allergens.  In some patients, this medication can cause daytime sleepiness so I recommend that you take 1 tablet daily at bedtime.     Flonase (fluticasone): This is a steroid nasal spray that used once daily, 1 spray in each nare.  This works best when used on a daily basis. This medication does not work well if it is only used when you think you need it.  After 3 to 5 days of use, you will notice significant reduction of the inflammation and mucus production that is currently being caused by exposure to allergens, whether seasonal or environmental.  The most common side effect of this medication is nosebleeds.  If you experience a nosebleed, please discontinue use for 1 week, then feel free to resume.  If you find that your insurance will not pay for this medication,  please consider a different nasal steroids such as Nasonex (mometasone), or Nasacort (triamcinolone).   Advil, Motrin (ibuprofen): This is a good anti-inflammatory medication which addresses aches, pains and inflammation of the upper airways that causes sinus and nasal congestion as well as in the lower airways which makes your cough feel tight and sometimes burn.  I recommend that you take between 400 to 600 mg every 6-8 hours as needed.  Please do not take more than 2400 mg of ibuprofen in a 24-hour period and please do not take high doses of ibuprofen for more than 3 days in a row as this can lead to stomach ulcers.   If symptoms have not meaningfully improved in the next 5 to 7 days, please return for repeat evaluation or follow-up with your regular provider.  If symptoms have worsened in the next 3 to 5 days, please go to the emergency room for further evaluation.    Thank you for visiting urgent care today.  We appreciate the opportunity to participate in your care.

## 2023-07-18 NOTE — ED Triage Notes (Signed)
 Pt c/o cough, fever, sore throat, headache, back pain, and dried blood in nose since last night. Last ibuprofen at 6pm.

## 2023-07-20 ENCOUNTER — Ambulatory Visit
Admission: EM | Admit: 2023-07-20 | Discharge: 2023-07-20 | Disposition: A | Discharge status: Home / Self Care | Payer: Medicaid Other | Attending: Internal Medicine | Admitting: Internal Medicine

## 2023-07-20 ENCOUNTER — Other Ambulatory Visit: Payer: Self-pay

## 2023-07-20 DIAGNOSIS — J209 Acute bronchitis, unspecified: Secondary | ICD-10-CM | POA: Diagnosis not present

## 2023-07-20 DIAGNOSIS — R051 Acute cough: Secondary | ICD-10-CM | POA: Diagnosis not present

## 2023-07-20 DIAGNOSIS — J0100 Acute maxillary sinusitis, unspecified: Secondary | ICD-10-CM | POA: Diagnosis not present

## 2023-07-20 LAB — POCT RAPID STREP A (OFFICE): Rapid Strep A Screen: NEGATIVE

## 2023-07-20 MED ORDER — AMOXICILLIN-POT CLAVULANATE 875-125 MG PO TABS: 1.0000 | ORAL_TABLET | Freq: Two times a day (BID) | ORAL | 0 refills | Status: AC

## 2023-07-20 MED ORDER — BENZONATATE 200 MG PO CAPS: 200.0000 mg | ORAL_CAPSULE | Freq: Three times a day (TID) | ORAL | 0 refills | Status: AC | PRN

## 2023-07-20 NOTE — ED Provider Notes (Signed)
 [facility]  Note:  This document was prepared using Dragon voice recognition software and may include unintentional dictation errors.  MRN: [id number] DOB: Mar 09, 1991 DATE: 07/20/23   Subjective:  Chief Complaint:  Chief Complaint  Patient presents with   Cough     HPI: Catherine Haney is a 33 y.o. female presenting for dry cough and congestion for the past 4 days. Per the EMR, patient was seen at our office on 07/18/2023 for similar symptoms. She was negative for COVID and flu at that time. She was diagnosed with allergies and started on Zyrtec and Flonase. Patient states she suddenly started feeling worse last night. Reports worsening cough and congestion with myalgias. No known sick contacts. She has been taking ibuprofen with no relief. Denies fever, nausea/vomiting, abdominal pain. Endorses cough, congestion, sore throat, otalgia. Presents NAD.  Prior to Admission medications   Medication Sig Start Date End Date Taking? Authorizing Provider  cetirizine (ZYRTEC ALLERGY) 10 MG tablet Take 1 tablet (10 mg total) by mouth at bedtime. 07/18/23 08/17/23  Theadora Rama Scales, PA-C  fluticasone (FLONASE) 50 MCG/ACT nasal spray Place 1 spray into both nostrils daily. Begin by using 2 sprays in each nare daily for 3 to 5 days, then decrease to 1 spray in each nare daily. 07/18/23   Theadora Rama Scales, PA-C  ibuprofen (ADVIL) 800 MG tablet Take 1 every 8 hours prn cramps 07/18/22   Cyril Mourning A, NP  levothyroxine (SYNTHROID) 50 MCG tablet Take 1 tablet (50 mcg total) by mouth daily before breakfast. 05/08/20   Dettinger, Elige Radon, MD  omeprazole (PRILOSEC OTC) 20 MG tablet Take 1 tablet (20 mg total) by mouth daily. 06/27/23   Yoshika Vensel P, PA-C     No Known Allergies  History:   Past Medical History:  Diagnosis Date   Anemia affecting third pregnancy 10/18/2018   HGB 11.7 --> 9.5 ->9.3 ->Fe++-> 10.1   GERD (gastroesophageal reflux disease)    PROM (premature rupture  of membranes) 01/13/2019   Supervision of normal pregnancy 07/10/2018     FAMILY TREE  LAB RESULTS Language Arabic Pap 10/19 per pt [place] PCP Initiated care at 13wk GC/CT Initial: -/-           36wks: Dating by LMP c/w 10wk U/S   Support person [employer] NT/IT:declined [id number]      MaterniT21:   [place]/HgbE neg Flu vaccine Declined 2/18 CF declined TDaP vaccine declined SMA  Rhogam n/a     Blood Type O/Positive/-- (02/18 1044) Anatomy US Normal female Antibody N     Past Surgical History:  Procedure Laterality Date   DILATION AND CURETTAGE OF UTERUS N/A 08/25/2017   Procedure: SUCTION DILATATION AND CURETTAGE;  Surgeon: Lazaro Arms, MD;  Location: [facility];  Service: Gynecology;  Laterality: N/A;    Family History  Problem Relation Age of Onset   Hypertension Mother    Diabetes Mother    Diabetes Father    Hypertension Paternal Grandfather     Social History   Tobacco Use   Smoking status: Never   Smokeless tobacco: Never  Vaping Use   Vaping status: Never Used  Substance Use Topics   Alcohol use: Never    Alcohol/week: 0.0 standard drinks of alcohol   Drug use: Never    Review of Systems  Constitutional:  Negative for fever.  HENT:  Positive for congestion, ear pain, rhinorrhea, sinus pressure and sore throat. Negative for ear discharge.   Respiratory:  Positive for cough.  Gastrointestinal:  Negative for abdominal pain, nausea and vomiting.  Musculoskeletal:  Positive for back pain and myalgias.     Objective:   Vitals: BP 126/82 (BP Location: Right Arm)   Temp 98.8 F (37.1 C) (Oral)   Resp 18   LMP 07/04/2023   SpO2 98%   Physical Exam Constitutional:      General: She is not in acute distress.    Appearance: Normal appearance. She is well-developed and normal weight. She is not ill-appearing or toxic-appearing.  HENT:     Head: Normocephalic and atraumatic.     Right Ear: Ear canal normal. A middle ear effusion is present.     Left Ear: Ear canal  normal. A middle ear effusion is present.     Nose: Rhinorrhea present. Rhinorrhea is clear.     Mouth/Throat:     Pharynx: Oropharynx is clear. Uvula midline. Posterior oropharyngeal erythema present. No pharyngeal swelling or oropharyngeal exudate.     Tonsils: No tonsillar exudate or tonsillar abscesses.  Cardiovascular:     Rate and Rhythm: Normal rate and regular rhythm.     Heart sounds: Normal heart sounds.  Pulmonary:     Effort: Pulmonary effort is normal.     Breath sounds: Normal breath sounds.     Comments: Clear to auscultation bilaterally  Abdominal:     General: Bowel sounds are normal.     Palpations: Abdomen is soft.     Tenderness: There is no abdominal tenderness.  Skin:    General: Skin is warm and dry.  Neurological:     General: No focal deficit present.     Mental Status: She is alert.  Psychiatric:        Mood and Affect: Mood and affect normal.     Results:  Labs: Results for orders placed or performed during the hospital encounter of 07/20/23 (from the past 24 hours)  POCT rapid strep A     Status: Normal   Collection Time: 07/20/23  2:11 PM  Result Value Ref Range   Rapid Strep A Screen Negative     Radiology: No results found.   UC Course/Treatments:  Procedures: Procedures   Medications Ordered in UC: Medications - No data to display   Assessment and Plan :     ICD-10-CM   1. Acute bronchitis, unspecified organism  J20.9     2. Acute cough  R05.1     3. Acute non-recurrent maxillary sinusitis  J01.00      Acute bronchitis, unspecified organism Afebrile, nontoxic-appearing, NAD. VSS. DDX includes but not limited to: COVID, flu, bronchitis, pneumonia, viral URI  Flu and COVID were negative today in office. Given sudden worsening and no improvement with OTC medications, Augmentin 875mg  BID was prescribed. Benzonatate 200mg  TID PRN was prescribed for cough. Strict ED precautions were given and patient verbalized  understanding.  Acute non-recurrent maxillary sinusitis Afebrile, nontoxic-appearing, NAD. VSS. DDX includes but not limited to: COVID, flu, viral URI, sinusitis, allergic rhinitis Flu and COVID were negative today in office. Given sudden worsening and no improvement with OTC medications, Augmentin 875mg  BID was prescribed. Strict ED precautions were given and patient verbalized understanding.   ED Discharge Orders          Ordered    amoxicillin-clavulanate (AUGMENTIN) 875-125 MG tablet  Every 12 hours        07/20/23 1421    benzonatate (TESSALON) 200 MG capsule  3 times daily PRN        07/20/23  1421             PDMP not reviewed this encounter.     Cynda Ac[place], PA-C 07/20/23 1453

## 2023-07-20 NOTE — ED Triage Notes (Signed)
 Patient C/O flu like symptoms x 4 days. C/O non productive cough, nasal congestion, back ache. C/O sore throat. Patient has had no known flu exposures. Patient is taking ibuprofen.

## 2023-07-20 NOTE — Discharge Instructions (Signed)
 Your flu, COVID, and strep tests were negative. A prescription was sent for Augmentin. This is an antibiotic used to treat upper respiratory infections. Take as directed. I have also sent you a prescription for cough medicine as well. Return in 3-4 days if no improvement. It is very important for you to pay attention to any new symptoms or worsening of your current condition. Please go directly to the Emergency Department immediately should you begin to have any of the following symptoms: shortness of breath, chest pain or difficulty breathing.
